# Patient Record
Sex: Female | Born: 1970 | Race: White | Hispanic: No | Smoking: Current every day smoker
Health system: Southern US, Community
[De-identification: ages and names within clinical notes are randomized; demographics above are authoritative.]

## PROBLEM LIST (undated history)

## (undated) DIAGNOSIS — F4321 Adjustment disorder with depressed mood: Secondary | ICD-10-CM

## (undated) DIAGNOSIS — F329 Major depressive disorder, single episode, unspecified: Secondary | ICD-10-CM

## (undated) DIAGNOSIS — I10 Essential (primary) hypertension: Secondary | ICD-10-CM

## (undated) DIAGNOSIS — F419 Anxiety disorder, unspecified: Secondary | ICD-10-CM

## (undated) DIAGNOSIS — F32A Depression, unspecified: Secondary | ICD-10-CM

## (undated) HISTORY — PX: OTHER SURGICAL HISTORY: SHX169

## (undated) HISTORY — DX: Adjustment disorder with depressed mood: F43.21

---

## 2002-08-13 ENCOUNTER — Emergency Department (HOSPITAL_COMMUNITY): Admission: EM | Admit: 2002-08-13 | Discharge: 2002-08-13 | Payer: Self-pay | Admitting: *Deleted

## 2002-08-13 ENCOUNTER — Emergency Department (HOSPITAL_COMMUNITY): Admission: EM | Admit: 2002-08-13 | Discharge: 2002-08-13 | Payer: Self-pay | Admitting: Emergency Medicine

## 2003-06-11 ENCOUNTER — Emergency Department (HOSPITAL_COMMUNITY): Admission: EM | Admit: 2003-06-11 | Discharge: 2003-06-11 | Payer: Self-pay | Admitting: Emergency Medicine
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
## 2003-07-28 ENCOUNTER — Encounter (HOSPITAL_COMMUNITY): Admission: RE | Admit: 2003-07-28 | Discharge: 2003-08-27 | Payer: Self-pay | Admitting: Family Medicine

## 2003-11-04 ENCOUNTER — Emergency Department (HOSPITAL_COMMUNITY): Admission: EM | Admit: 2003-11-04 | Discharge: 2003-11-04 | Payer: Self-pay | Admitting: Emergency Medicine
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
## 2009-05-20 ENCOUNTER — Inpatient Hospital Stay (HOSPITAL_COMMUNITY): Admission: EM | Admit: 2009-05-20 | Discharge: 2009-05-21 | Payer: Self-pay | Admitting: Emergency Medicine

## 2009-05-23 ENCOUNTER — Emergency Department (HOSPITAL_COMMUNITY): Admission: EM | Admit: 2009-05-23 | Discharge: 2009-05-23 | Payer: Self-pay | Admitting: Emergency Medicine
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
## 2010-05-02 LAB — DIFFERENTIAL
Basophils Absolute: 0 10*3/uL (ref 0.0–0.1)
Basophils Absolute: 0 10*3/uL (ref 0.0–0.1)
Basophils Relative: 0 % (ref 0–1)
Lymphocytes Relative: 17 % (ref 12–46)
Lymphocytes Relative: 24 % (ref 12–46)
Lymphs Abs: 1.4 10*3/uL (ref 0.7–4.0)
Monocytes Absolute: 0.9 10*3/uL (ref 0.1–1.0)
Monocytes Relative: 11 % (ref 3–12)
Neutro Abs: 5.7 10*3/uL (ref 1.7–7.7)

## 2010-05-02 LAB — BASIC METABOLIC PANEL
CO2: 26 mEq/L (ref 19–32)
GFR calc Af Amer: >60 mL/min (ref >60)
Potassium: 3.3 mEq/L — ABNORMAL LOW (ref 3.5–5.1)
Sodium: 127 mEq/L — ABNORMAL LOW (ref 135–145)

## 2010-05-02 LAB — COMPREHENSIVE METABOLIC PANEL
AST: 90 U/L — ABNORMAL HIGH (ref 0–37)
Albumin: 3.9 g/dL (ref 3.5–5.2)
BUN: 4 mg/dL — ABNORMAL LOW (ref 6–23)
CO2: 26 mEq/L (ref 19–32)
Calcium: 9.3 mg/dL (ref 8.4–10.5)
Creatinine, Ser: 0.56 mg/dL (ref 0.4–1.2)
Potassium: 3.9 mEq/L (ref 3.5–5.1)
Sodium: 134 mEq/L — ABNORMAL LOW (ref 135–145)
Total Protein: 6.8 g/dL (ref 6.0–8.3)

## 2010-05-02 LAB — URINALYSIS, ROUTINE W REFLEX MICROSCOPIC
Bilirubin Urine: NEGATIVE
Glucose, UA: NEGATIVE mg/dL
Glucose, UA: NEGATIVE mg/dL
Hgb urine dipstick: NEGATIVE
Ketones, ur: NEGATIVE mg/dL
Ketones, ur: NEGATIVE mg/dL
Nitrite: NEGATIVE
Protein, ur: NEGATIVE mg/dL
Protein, ur: NEGATIVE mg/dL
Specific Gravity, Urine: <1.005 — ABNORMAL LOW (ref 1.005–1.030)
Urobilinogen, UA: 0.2 mg/dL (ref 0.0–1.0)

## 2010-05-02 LAB — PROTIME-INR: Prothrombin Time: 14.2 seconds (ref 11.6–15.2)

## 2010-05-02 LAB — HEPATIC FUNCTION PANEL
ALT: 80 U/L — ABNORMAL HIGH (ref 0–35)
Bilirubin, Direct: 0.3 mg/dL (ref 0.0–0.3)
Indirect Bilirubin: 0.7 mg/dL (ref 0.3–0.9)
Total Bilirubin: 1 mg/dL (ref 0.3–1.2)
Total Protein: 6.9 g/dL (ref 6.0–8.3)

## 2010-05-02 LAB — CBC
HCT: 30.3 % — ABNORMAL LOW (ref 36.0–46.0)
Hemoglobin: 11.3 g/dL — ABNORMAL LOW (ref 12.0–15.0)
MCHC: 37.3 g/dL — ABNORMAL HIGH (ref 30.0–36.0)
MCV: 103.3 fL — ABNORMAL HIGH (ref 78.0–100.0)
RBC: 3.09 MIL/uL — ABNORMAL LOW (ref 3.87–5.11)
RDW: 13.9 % (ref 11.5–15.5)
WBC: 9 10*3/uL (ref 4.0–10.5)

## 2010-05-02 LAB — PREGNANCY, URINE: Preg Test, Ur: NEGATIVE

## 2010-05-02 LAB — LIPASE, BLOOD
Lipase: 32 U/L (ref 11–59)
Lipase: 33 U/L (ref 11–59)

## 2010-05-02 LAB — ETHANOL: Alcohol, Ethyl (B): 315 mg/dL — ABNORMAL HIGH (ref 0–10)

## 2010-05-02 LAB — TYPE AND SCREEN: ABO/RH(D): O POS

## 2011-03-27 ENCOUNTER — Emergency Department (HOSPITAL_COMMUNITY): Payer: Medicaid Other

## 2011-03-27 ENCOUNTER — Inpatient Hospital Stay (HOSPITAL_COMMUNITY)
Admission: EM | Admit: 2011-03-27 | Discharge: 2011-03-31 | DRG: 200 | Disposition: A | Discharge status: Home / Self Care | Payer: Medicaid Other | Attending: Pulmonary Disease | Admitting: Pulmonary Disease

## 2011-03-27 ENCOUNTER — Other Ambulatory Visit: Payer: Self-pay

## 2011-03-27 ENCOUNTER — Encounter (HOSPITAL_COMMUNITY): Payer: Self-pay

## 2011-03-27 DIAGNOSIS — W19XXXA Unspecified fall, initial encounter: Secondary | ICD-10-CM | POA: Diagnosis present

## 2011-03-27 DIAGNOSIS — Z79899 Other long term (current) drug therapy: Secondary | ICD-10-CM

## 2011-03-27 DIAGNOSIS — I10 Essential (primary) hypertension: Secondary | ICD-10-CM | POA: Diagnosis present

## 2011-03-27 DIAGNOSIS — Z7982 Long term (current) use of aspirin: Secondary | ICD-10-CM

## 2011-03-27 DIAGNOSIS — Z23 Encounter for immunization: Secondary | ICD-10-CM

## 2011-03-27 DIAGNOSIS — Z720 Tobacco use: Secondary | ICD-10-CM

## 2011-03-27 DIAGNOSIS — F3289 Other specified depressive episodes: Secondary | ICD-10-CM | POA: Diagnosis present

## 2011-03-27 DIAGNOSIS — D7589 Other specified diseases of blood and blood-forming organs: Secondary | ICD-10-CM | POA: Diagnosis present

## 2011-03-27 DIAGNOSIS — F17200 Nicotine dependence, unspecified, uncomplicated: Secondary | ICD-10-CM | POA: Diagnosis present

## 2011-03-27 DIAGNOSIS — J90 Pleural effusion, not elsewhere classified: Secondary | ICD-10-CM

## 2011-03-27 DIAGNOSIS — E46 Unspecified protein-calorie malnutrition: Secondary | ICD-10-CM | POA: Diagnosis present

## 2011-03-27 DIAGNOSIS — E876 Hypokalemia: Secondary | ICD-10-CM | POA: Diagnosis not present

## 2011-03-27 DIAGNOSIS — S271XXA Traumatic hemothorax, initial encounter: Principal | ICD-10-CM | POA: Diagnosis present

## 2011-03-27 DIAGNOSIS — J189 Pneumonia, unspecified organism: Secondary | ICD-10-CM

## 2011-03-27 DIAGNOSIS — F329 Major depressive disorder, single episode, unspecified: Secondary | ICD-10-CM | POA: Diagnosis present

## 2011-03-27 DIAGNOSIS — F32A Depression, unspecified: Secondary | ICD-10-CM

## 2011-03-27 DIAGNOSIS — J13 Pneumonia due to Streptococcus pneumoniae: Secondary | ICD-10-CM

## 2011-03-27 HISTORY — DX: Tobacco use: Z72.0

## 2011-03-27 HISTORY — DX: Depression, unspecified: F32.A

## 2011-03-27 HISTORY — DX: Essential (primary) hypertension: I10

## 2011-03-27 HISTORY — DX: Other specified diseases of blood and blood-forming organs: D75.89

## 2011-03-27 HISTORY — DX: Pleural effusion, not elsewhere classified: J90

## 2011-03-27 HISTORY — DX: Pneumonia, unspecified organism: J18.9

## 2011-03-27 HISTORY — DX: Major depressive disorder, single episode, unspecified: F32.9

## 2011-03-27 LAB — CBC
Hemoglobin: 13.4 g/dL (ref 12.0–15.0)
MCH: 35.3 pg — ABNORMAL HIGH (ref 26.0–34.0)
Platelets: 207 10*3/uL (ref 150–400)
RBC: 3.8 MIL/uL — ABNORMAL LOW (ref 3.87–5.11)
WBC: 9.5 10*3/uL (ref 4.0–10.5)

## 2011-03-27 LAB — POCT I-STAT, CHEM 8
Chloride: 104 mEq/L (ref 96–112)
Creatinine, Ser: 0.5 mg/dL (ref 0.50–1.10)
HCT: 41 % (ref 36.0–46.0)
Hemoglobin: 13.9 g/dL (ref 12.0–15.0)
Potassium: 3.6 mEq/L (ref 3.5–5.1)
Sodium: 139 mEq/L (ref 135–145)

## 2011-03-27 LAB — DIFFERENTIAL
Basophils Relative: 0 % (ref 0–1)
Eosinophils Absolute: 0.1 10*3/uL (ref 0.0–0.7)
Lymphs Abs: 0.5 10*3/uL — ABNORMAL LOW (ref 0.7–4.0)
Neutro Abs: 7.7 10*3/uL (ref 1.7–7.7)
Neutrophils Relative %: 81 % — ABNORMAL HIGH (ref 43–77)

## 2011-03-27 IMAGING — CR DG CHEST 2V
2 series · 2 of 2 positions shown · non-contrast
Comparison: None.

CLINICAL DATA: Pneumonia, chest pain for week, shortness of breath,
long time smoking history

CHEST - 2 VIEW

[w chest pa]
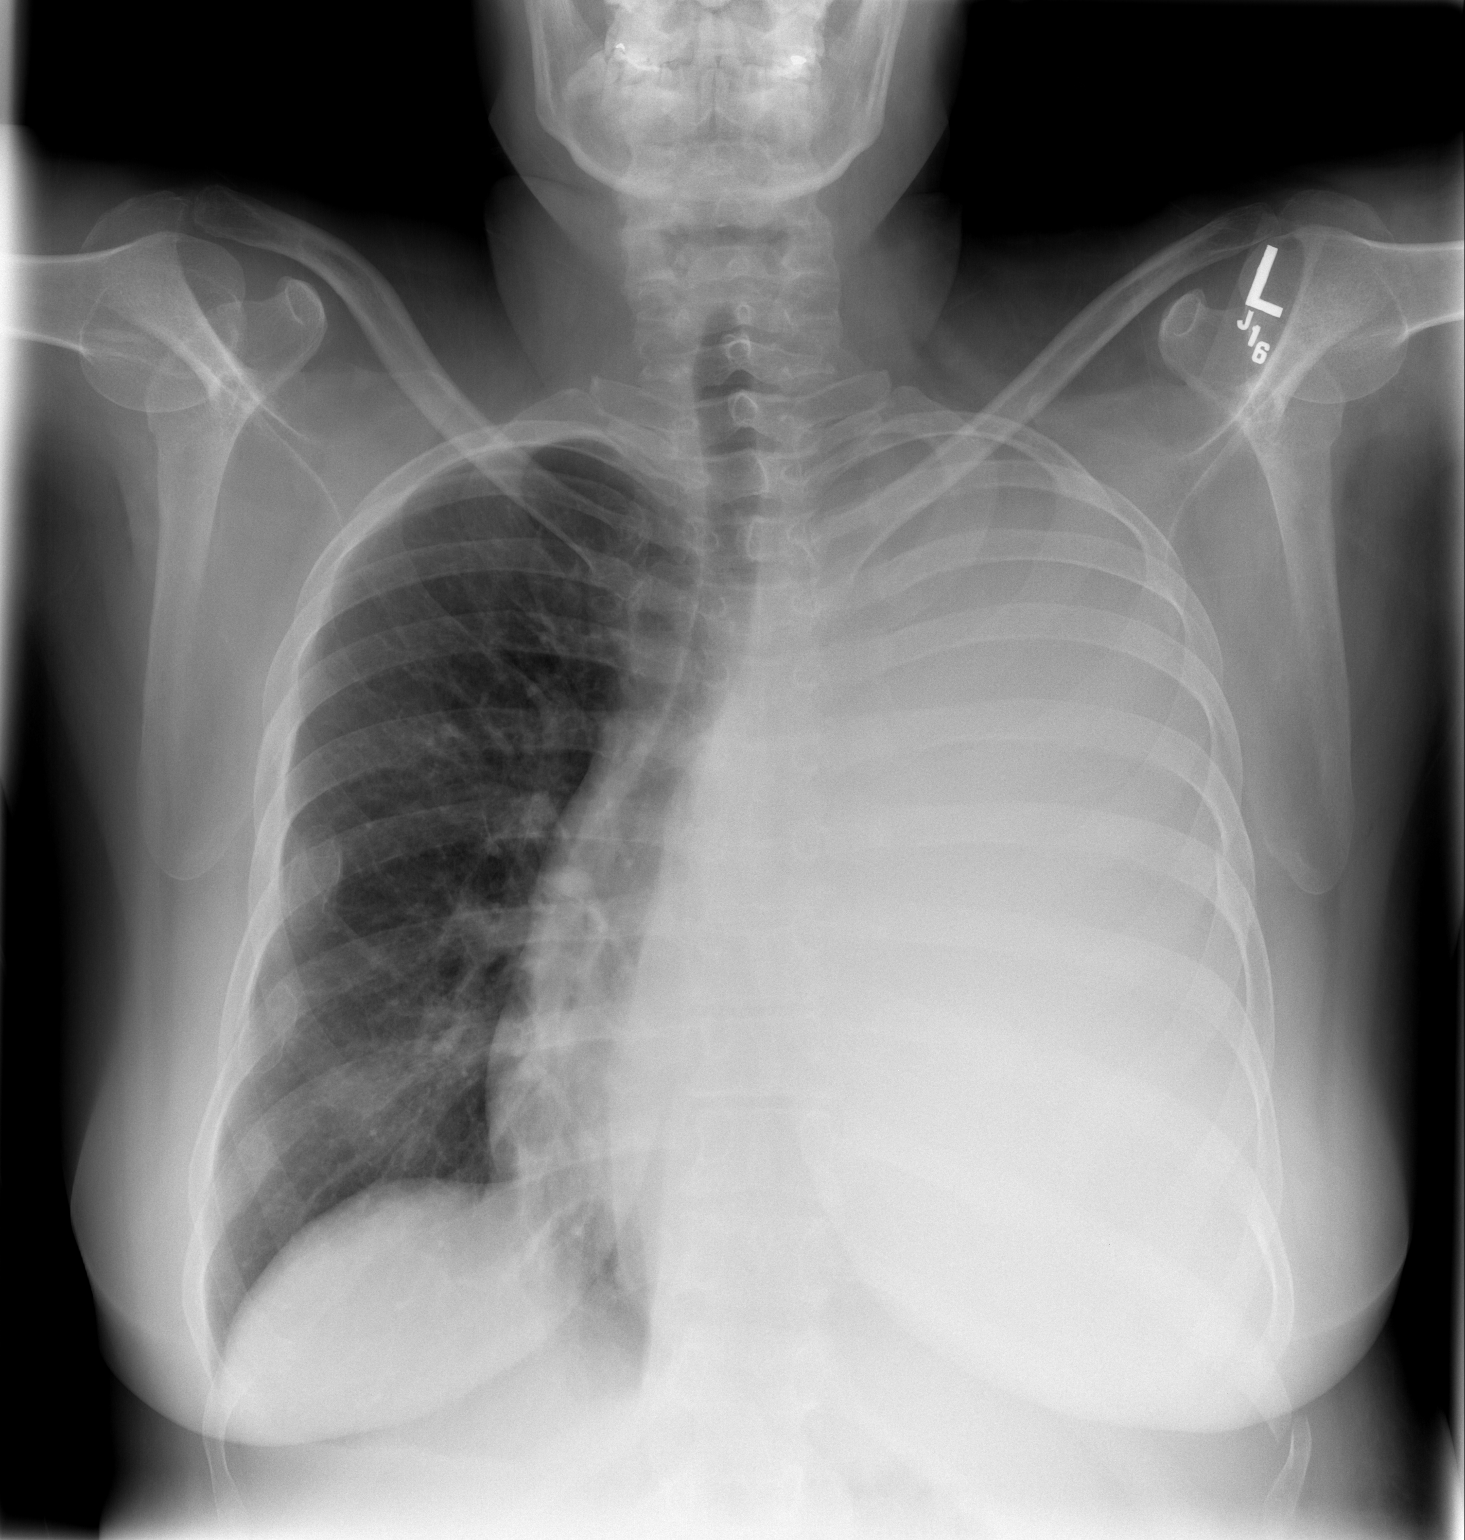

[w chest lat]
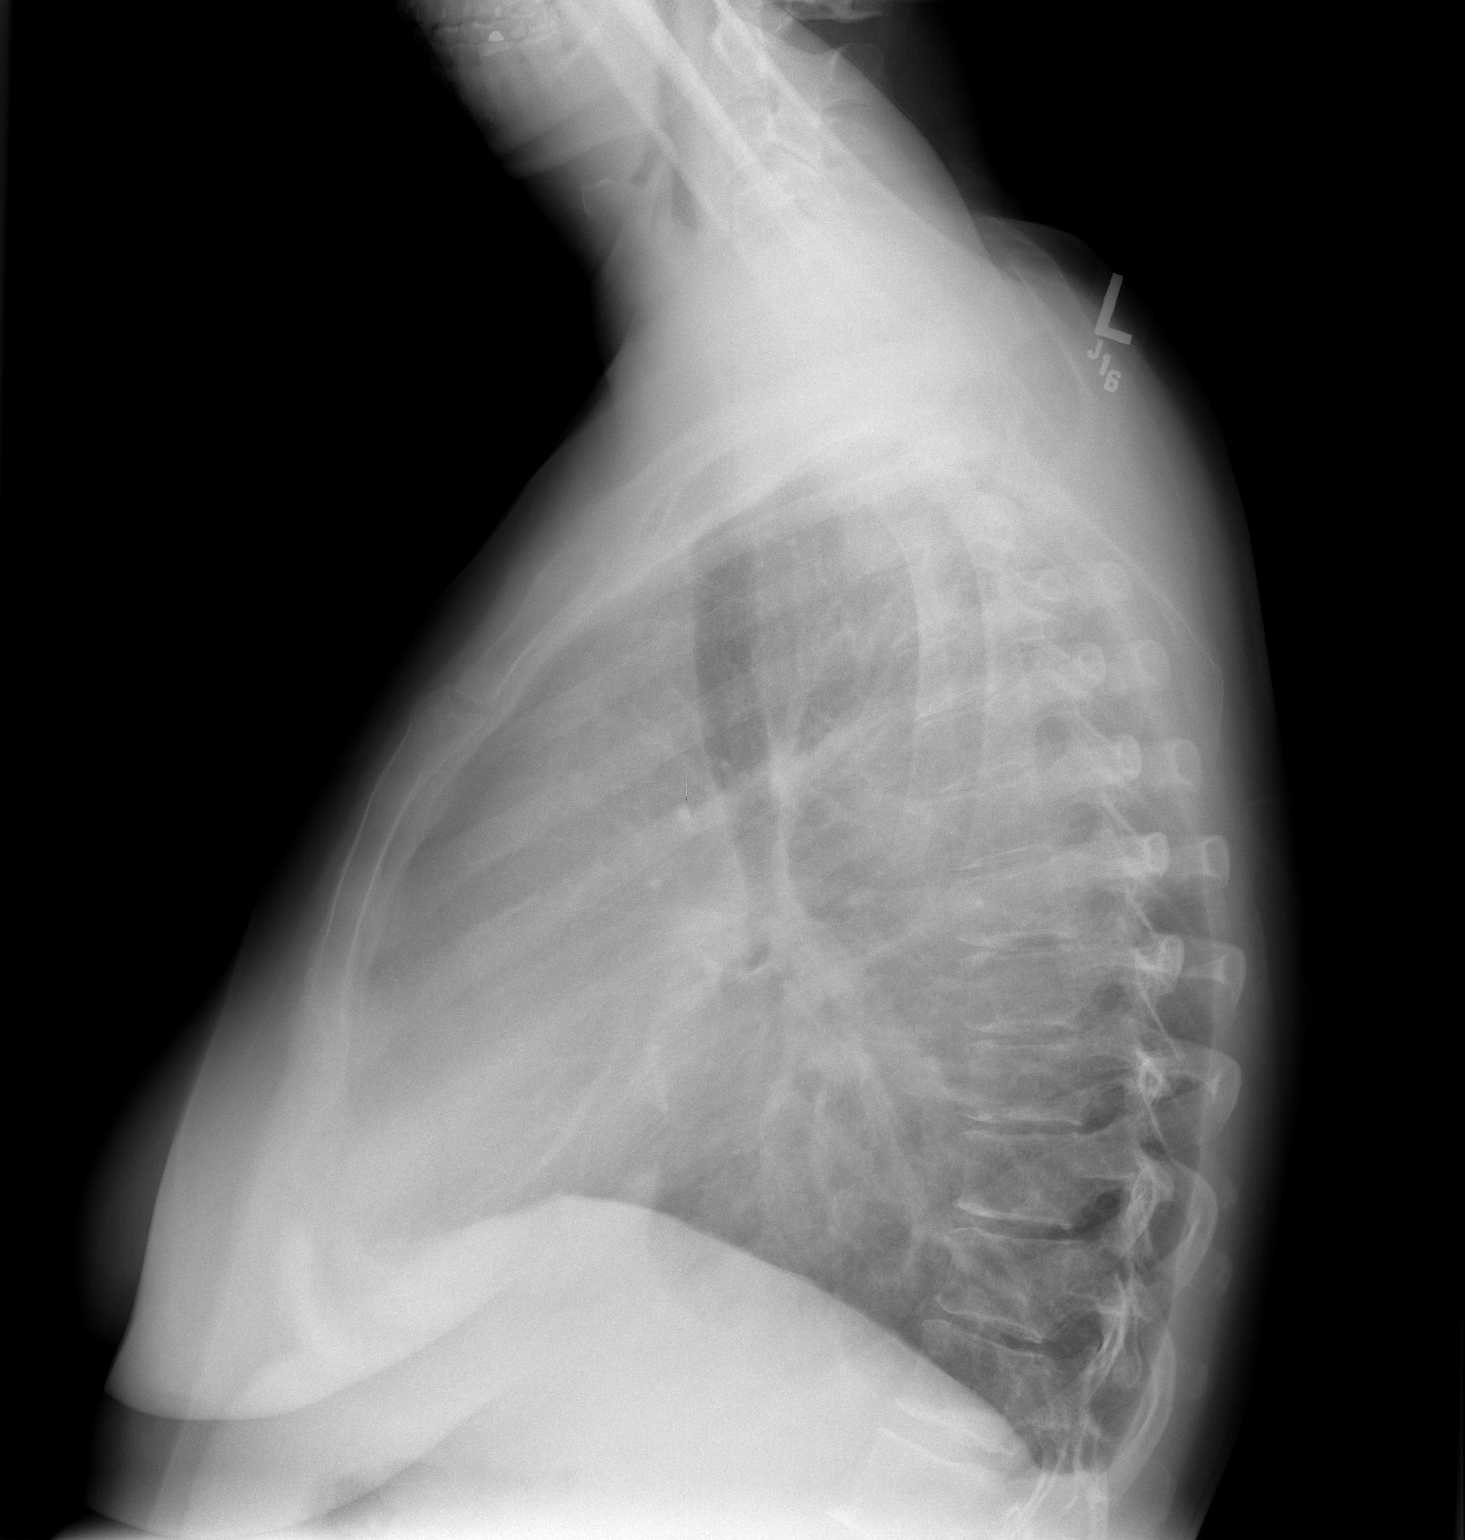

[2 of 2 positions shown; findings below may reference images not displayed]

FINDINGS: There is complete opacification of the left hemithorax
with mediastinal shift to the right.  This mediastinal shift
suggests a large left effusion.  The primary consideration is that
of central endobronchial lesion, and CT of the chest with IV
contrast media is recommended.  The right lung is clear.  The left
mainstem bronchus is not definitely visualized.  Old healed right
rib fractures are noted.
IMPRESSION: Complete opacification of the left hemithorax with no visualization
of the left mainstem bronchus.  These findings are concerning for
primary lung carcinoma and CT of the chest with IV contrast media
is recommended.

## 2011-03-27 IMAGING — CT CT CHEST W/ CM
2 of 4 series · 15 of 36 positions shown, 18 images · IV contrast (omnipaque)
Comparison: Chest radiograph [DATE] and CT abdomen pelvis
[DATE].

CLINICAL DATA: Bronchitis with worsening cough, night sweats and
increasing shortness of breath.

CT CHEST WITH CONTRAST
TECHNIQUE: Multidetector CT imaging of the chest was performed
following the standard protocol during bolus administration of
intravenous contrast.
Contrast: 80mL OMNIPAQUE IOHEXOL 300 MG/ML IV SOLN

[Series 2: routine chest 5.0 st · axial · 0.79mm/px · z∈[-363,-78]mm · 12 of 63 slices shown, 15 images]
[im 3/63  mediastinal]
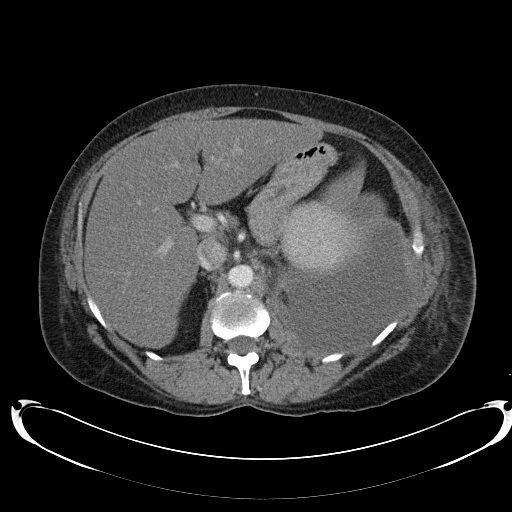
[im 3/63  lung]
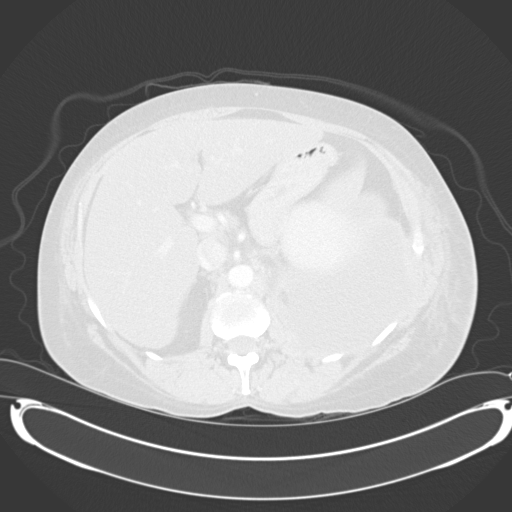
[im 9/63  lung]
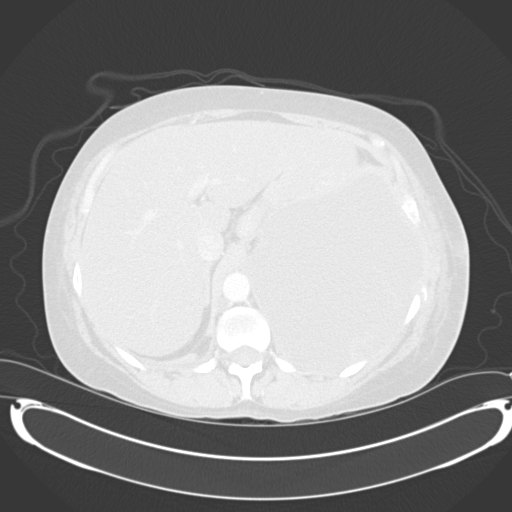
[im 14/63  lung]
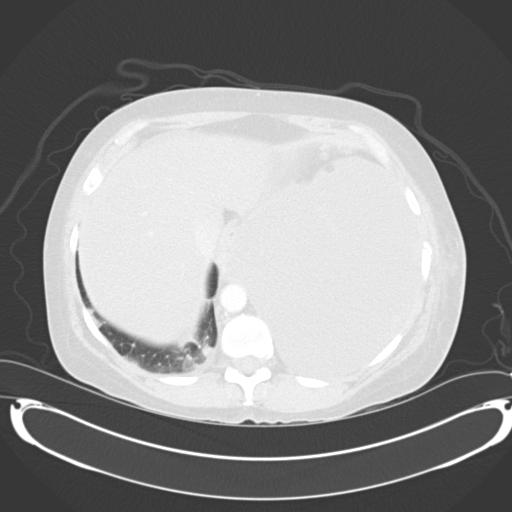
[im 19/63  lung]
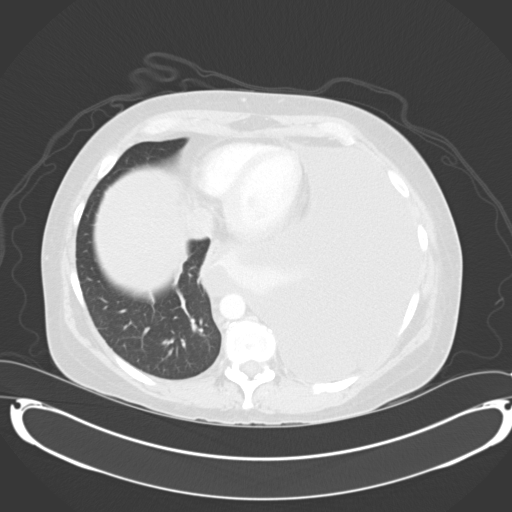
[im 25/63  mediastinal]
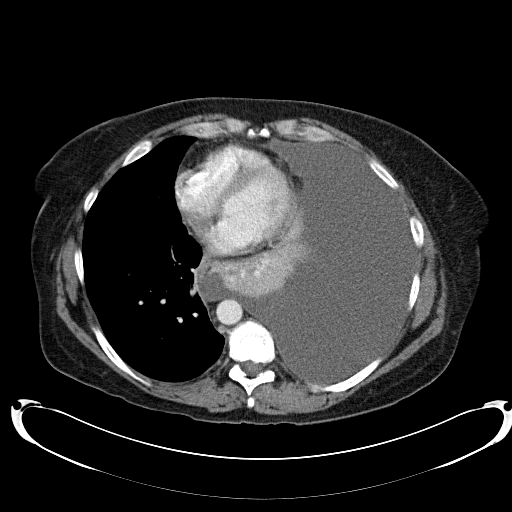
[im 25/63  lung]
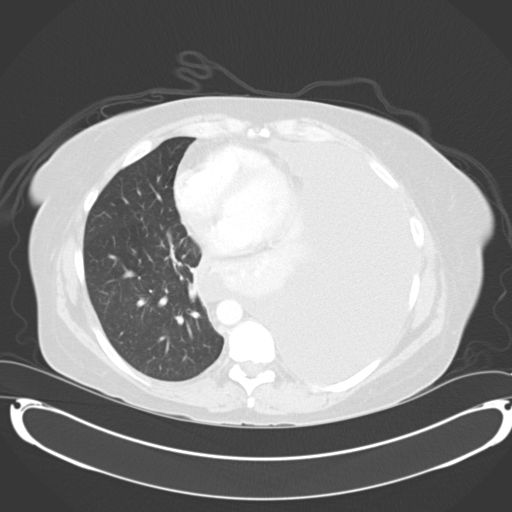
[im 30/63  lung]
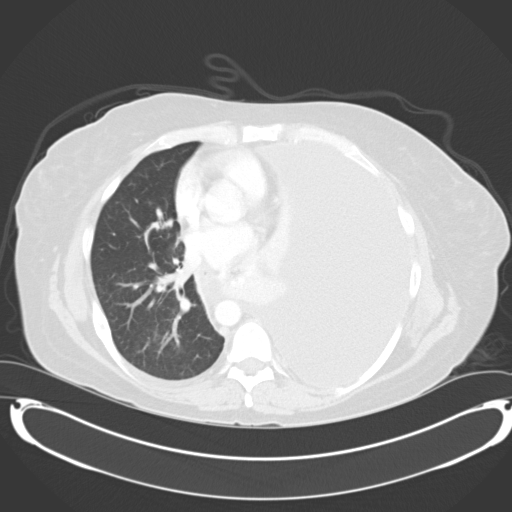
[im 33/63  lung]
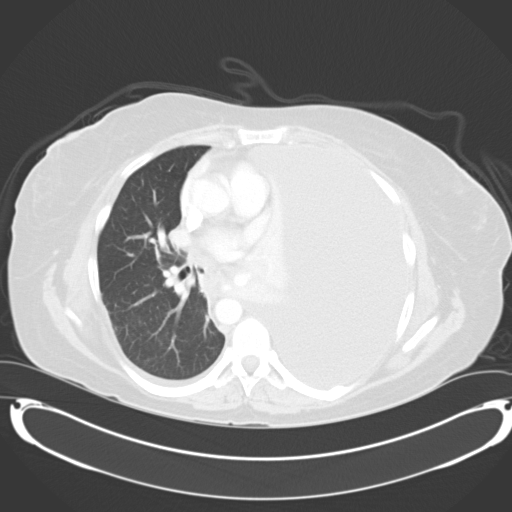
[im 38/63  lung]
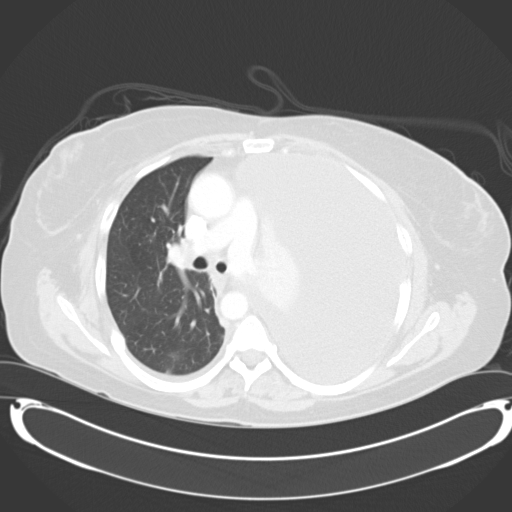
[im 44/63  mediastinal]
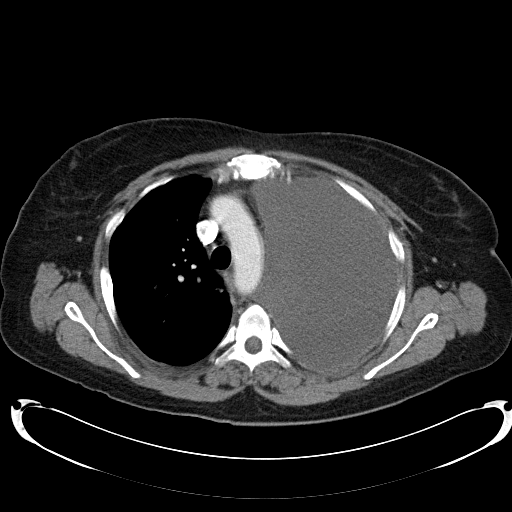
[im 44/63  lung]
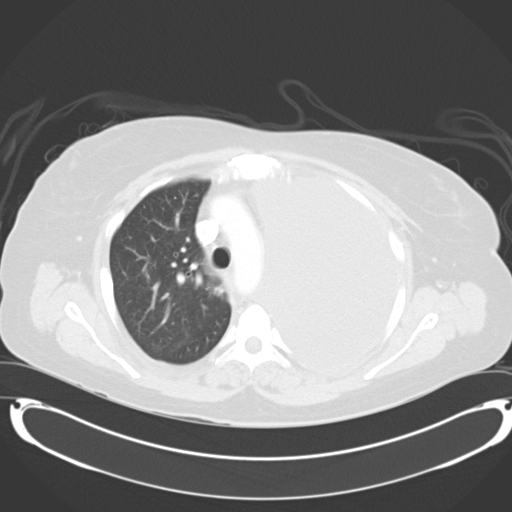
[im 49/63  lung]
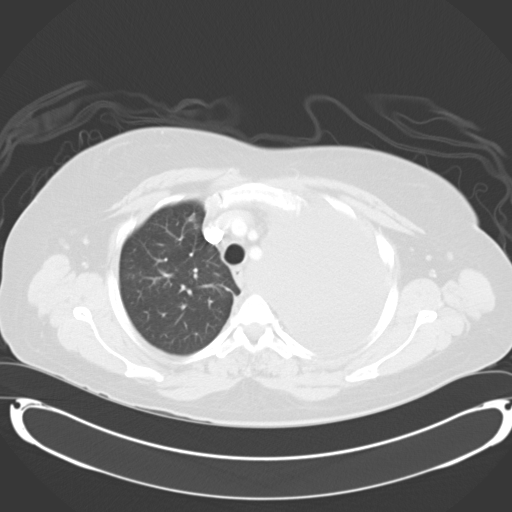
[im 54/63  lung]
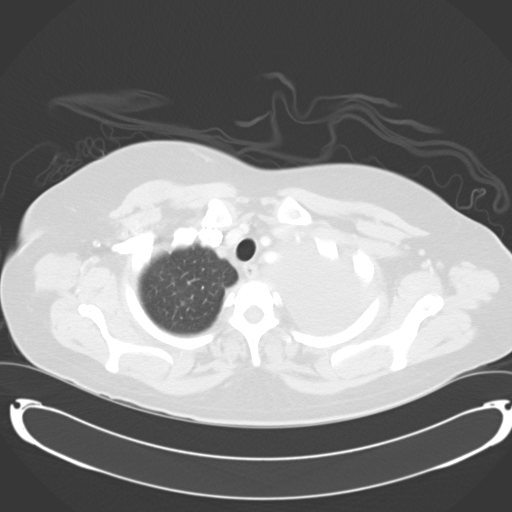
[im 60/63  lung]
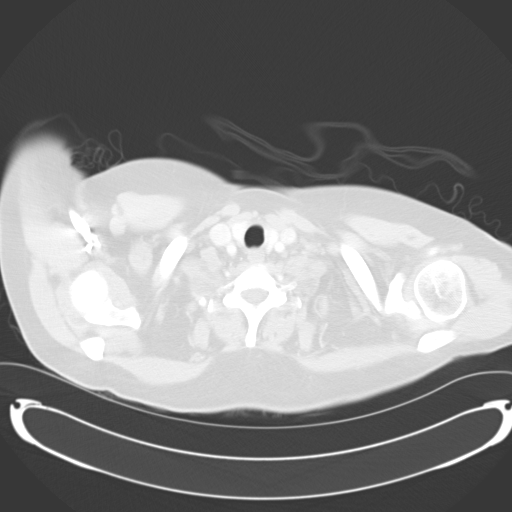

[Series 6: coronals · coronal · 0.72mm/px · 3 of 81 slices shown]
[im 17/81  lung]
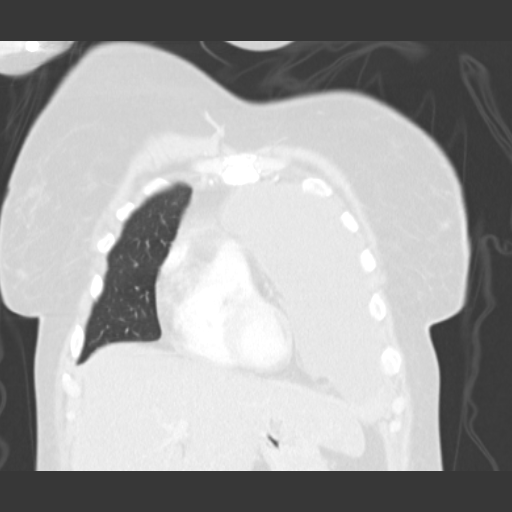
[im 33/81  lung]
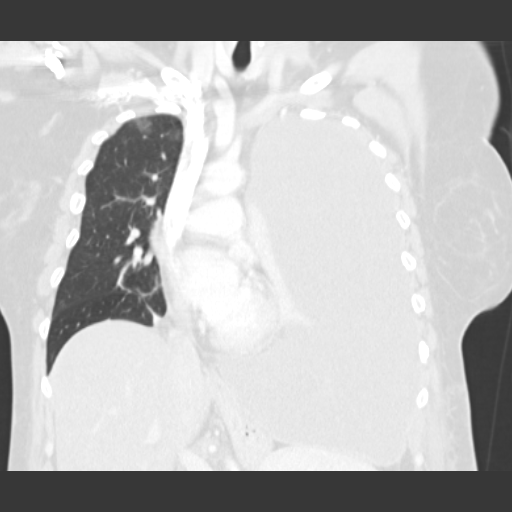
[im 49/81  lung]
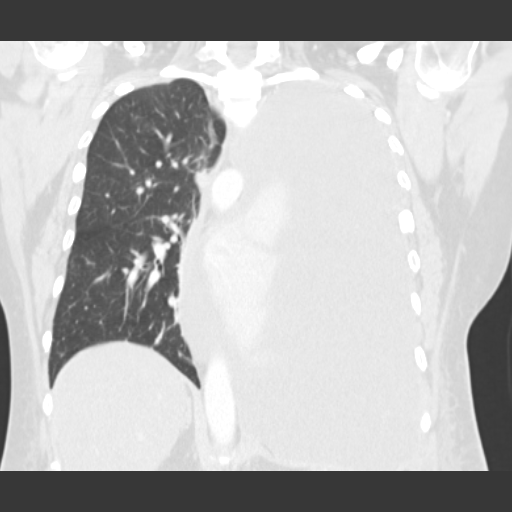

[15 of 36 positions shown; findings below may reference images not displayed]

FINDINGS: No definite mediastinal adenopathy.  Subcarinal lymph
node measures approximately 9 mm.  New right hilar or axillary
adenopathy.

There is a large left pleural effusion, which occupies the entire
left hemithorax and exerts rightward mass effect on the heart and
mediastinum.  Some wispy high density areas are seen inferiorly
within the fluid.  Additionally, there is low attenuation material
within the left upper and left lower lobe bronchi with resultant
obstruction and complete collapse of the left upper and left lower
lobes.

Trace right pleural fluid.  Scattered patchy ground-glass air space
disease in the right lung.  Minimal compressive atelectasis is seen
dependently in the right lower lobe.

Incidental imaging of the upper abdomen shows low attenuation
throughout the visualized portion of the liver. Left hepatic lobe
hemangioma is better visualized on prior studies.  Gastrohepatic
ligament lymph nodes measure up to 11 mm.  No worrisome lytic or
sclerotic lesions.  Old rib fractures are seen bilaterally.  Acute
fractures of the left eighth, ninth and tenth lateral ribs are
noted.
IMPRESSION: 1.  Mildly complex large left pleural effusion occupies the entire
left hemithorax.  Associated low attenuation debris or soft tissue
in the region of the left upper and lower lobe bronchi with
complete collapse of the left upper and left lower lobes. Centrally-
obstructing malignancy cannot be excluded.
2.  Scattered patchy ground-glass in the right lung may be
infectious or inflammatory in etiology.
3.  Tiny right pleural effusion.
4.  Acute left eighth, ninth and tenth lateral rib fractures.
5.  Hepatic steatosis.

## 2011-03-27 MED ORDER — ALBUTEROL SULFATE (5 MG/ML) 0.5% IN NEBU
5.0000 mg | INHALATION_SOLUTION | RESPIRATORY_TRACT | Status: AC
  Administered 2011-03-27: 5 mg via RESPIRATORY_TRACT
  Filled 2011-03-27: qty 1

## 2011-03-27 MED ORDER — MORPHINE SULFATE 4 MG/ML IJ SOLN
4.0000 mg | INTRAMUSCULAR | Status: AC
  Administered 2011-03-27: 4 mg via INTRAVENOUS
  Filled 2011-03-27: qty 1

## 2011-03-27 MED ORDER — SODIUM CHLORIDE 0.9 % IV SOLN
INTRAVENOUS | Status: DC
  Administered 2011-03-27 – 2011-03-28 (×2): via INTRAVENOUS
  Administered 2011-03-28: 20 mL/h via INTRAVENOUS
  Administered 2011-03-30: 02:00:00 via INTRAVENOUS

## 2011-03-27 MED ORDER — IBUPROFEN 600 MG PO TABS
600.0000 mg | ORAL_TABLET | Freq: Four times a day (QID) | ORAL | Status: DC | PRN
  Administered 2011-03-28 – 2011-03-29 (×5): 600 mg via ORAL
  Filled 2011-03-27 (×6): qty 1

## 2011-03-27 MED ORDER — DEXTROSE 5 % IV SOLN
1.0000 g | INTRAVENOUS | Status: DC
  Filled 2011-03-27: qty 10

## 2011-03-27 MED ORDER — SODIUM CHLORIDE 0.9 % IV BOLUS (SEPSIS)
1000.0000 mL | Freq: Once | INTRAVENOUS | Status: AC
  Administered 2011-03-27: 1000 mL via INTRAVENOUS

## 2011-03-27 MED ORDER — LISINOPRIL 20 MG PO TABS
20.0000 mg | ORAL_TABLET | Freq: Every day | ORAL | Status: DC
  Administered 2011-03-28 – 2011-03-31 (×4): 20 mg via ORAL
  Filled 2011-03-27 (×4): qty 1

## 2011-03-27 MED ORDER — HYDROMORPHONE HCL PF 1 MG/ML IJ SOLN
1.0000 mg | Freq: Once | INTRAMUSCULAR | Status: AC
  Administered 2011-03-27: 1 mg via INTRAVENOUS
  Filled 2011-03-27: qty 1

## 2011-03-27 MED ORDER — DEXTROSE 5 % IV SOLN
1.0000 g | Freq: Once | INTRAVENOUS | Status: AC
  Administered 2011-03-27: 1 g via INTRAVENOUS
  Filled 2011-03-27: qty 10

## 2011-03-27 MED ORDER — IOHEXOL 300 MG/ML  SOLN
80.0000 mL | Freq: Once | INTRAMUSCULAR | Status: AC | PRN
  Administered 2011-03-27: 80 mL via INTRAVENOUS

## 2011-03-27 MED ORDER — DEXTROSE 5 % IV SOLN
500.0000 mg | INTRAVENOUS | Status: DC
  Filled 2011-03-27: qty 500

## 2011-03-27 MED ORDER — ESCITALOPRAM OXALATE 10 MG PO TABS
10.0000 mg | ORAL_TABLET | Freq: Every day | ORAL | Status: DC
  Administered 2011-03-28 – 2011-03-31 (×4): 10 mg via ORAL
  Filled 2011-03-27 (×4): qty 1

## 2011-03-27 MED ORDER — DEXTROSE 5 % IV SOLN
500.0000 mg | Freq: Once | INTRAVENOUS | Status: AC
  Administered 2011-03-27: 500 mg via INTRAVENOUS
  Filled 2011-03-27: qty 500

## 2011-03-27 NOTE — H&P (Signed)
Name: Kayla Sandoval MRN: [id number] DOB: [birth year]  LOS: 0  PULMONARY ADMISSION NOTE  History of Present Illness: 41 y/o female with HTN and depression presented to the [facility] ED on 2/13 with three weeks of dyspnea.  She fell approximately 3.5 weeks prior to admission and struck her left rib cage.  3 weeks prior to admission she developed a productive cough which she treated at home with antibiotics she had left over from a prior illness.  The productive cough went a way but she remained dyspnic so she went to urgent care on 2/11 and had a CXR showing a large effusion on the left.  She was told to go to [facility], but had to "build up the courage" for it and did not show up until tonight.  She has not had fever or chills but has noted nighttime sweats.  Has lost 30lbs in the last 6 months, has been dieting some.  No hemoptysis.  Lines / Drains:   Cultures / Sepsis markers: 2/13 blood cx x2 >> 2/13 urine st/leg ag >>  Antibiotics: 2/13 ceftriaxone pneumonia >> 2/13 azithro pneumonia >>  Tests / Events: 2/13 CT chest large left effusion with mediastinal shift, collapse of left lung, cannot exclude underlying mass     Past Medical History  Diagnosis Date  . Hypertension   . Depression    History reviewed. No pertinent past surgical history. Prior to Admission medications   Medication Sig Start Date End Date Taking? Authorizing Provider  aspirin 325 MG tablet Take 325 mg by mouth daily as needed. As needed for pain.   Yes Historical Provider, MD  escitalopram (LEXAPRO) 10 MG tablet Take 10 mg by mouth daily.   Yes Historical Provider, MD  ibuprofen (ADVIL,MOTRIN) 200 MG tablet Take 600 mg by mouth every 6 (six) hours as needed.   Yes Historical Provider, MD  lisinopril (PRINIVIL,ZESTRIL) 20 MG tablet Take 20 mg by mouth daily.   Yes Historical Provider, MD   No Known Allergies History reviewed. No pertinent family history. Social History  reports that she has been smoking.  She does not  have any smokeless tobacco history on file. She reports that she drinks alcohol. She reports that she does not use illicit drugs.  Review Of Systems   Gen: Denies fever, chills, weight change, fatigue, has noted night sweats HEENT: Denies blurred vision, double vision, hearing loss, tinnitus, sinus congestion, rhinorrhea, sore throat, neck stiffness, dysphagia PULM: Notes some shortness of breath, denies cough, sputum production, hemoptysis, wheezing now CV: Denies chest pain, edema, orthopnea, paroxysmal nocturnal dyspnea, palpitations GI: Denies abdominal pain, nausea, vomiting, diarrhea, hematochezia, melena, constipation, change in bowel habits GU: Denies dysuria, hematuria, polyuria, oliguria, urethral discharge Endocrine: Denies hot or cold intolerance, polyuria, polyphagia or appetite change Derm: Denies rash, dry skin, scaling or peeling skin change Heme: Denies easy bruising, bleeding, bleeding gums Neuro: Denies headache, numbness, weakness, slurred speech, loss of memory or consciousness  Vital Signs:   Filed Vitals:   03/27/11 1800 03/27/11 1817 03/27/11 1927 03/27/11 2118  BP: 116/87 116/87 131/97 124/90  Pulse: 90 92 96 101  Temp:      TempSrc:      Resp: 14 15 26 20   Height:      Weight:      SpO2: 95% 96% 96% 94%    Physical Examination: Gen:  no acute distress, speaking in full sentences, no accessory muscle use HEENT: NCAT, PERRL, EOMi, OP clear, OP clear Neck: supple without masses or  lad PULM: Occassional insp wheeze on R, diminished throughout left CV: RRR, no mgr, no JVD AB: BS+, soft, nontender, no hsm Ext: warm, no edema, no clubbing, no cyanosis Derm: no rash or skin breakdown Neuro: A&Ox4, CN II-XII intact, strength 5/5 in all 4 extremities Psyche: anxious  Labs and Imaging:   CBC    Component Value Date/Time   WBC 9.5 03/27/2011 0957   RBC 3.80* 03/27/2011 0957   HGB 13.9 03/27/2011 1128   HCT 41.0 03/27/2011 1128   PLT 207 03/27/2011 0957   MCV  103.7* 03/27/2011 0957   MCH 35.3* 03/27/2011 0957   MCHC 34.0 03/27/2011 0957   RDW 14.5 03/27/2011 0957   LYMPHSABS 0.5* 03/27/2011 0957   MONOABS 1.2* 03/27/2011 0957   EOSABS 0.1 03/27/2011 0957   BASOSABS 0.0 03/27/2011 0957    BMET    Component Value Date/Time   NA 139 03/27/2011 1128   K 3.6 03/27/2011 1128   CL 104 03/27/2011 1128   CO2 26 05/23/2009 1720   GLUCOSE 98 03/27/2011 1128   BUN <3* 03/27/2011 1128   CREATININE 0.50 03/27/2011 1128   CALCIUM 9.3 05/23/2009 1720   GFRNONAA >60 05/23/2009 1720   GFRAA  Value: >60        The eGFR has been calculated using the MDRD equation. This calculation has not been validated in all clinical situations. eGFR's persistently <60 mL/min signify possible Chronic Kidney Disease. 05/23/2009 1720   2/13 CT: see above  Assessment and Plan:  41 y/o female with hypertension and tobacco abuse who presents with a large pleural effusion after a fall 3.5 weeks prior to admission symptoms of bronchitis vs. pneumonia 3 weeks prior to admission.  DDx includes traumatic/hemothorax vs. parapneumonic effusion vs. malignant effusion.  Pleural effusion (03/27/2011)   Assessment: ddx as above   Plan:  -thoracentesis by PCCM in AM -will send usual labs to evaluate exudate vs. Transudate as well as cytology and culture -pt/inr now  Pneumonia (03/27/2011)   Assessment: unclear to me if she had pneumonia prior, given ongoing sweats at night will continue antibiotics started in ED but more to cover a parapneumonic effusion.  Abx should not affect tomorrow's pleural chemistries if this is a complicated parapneumonic effusion.   Plan:  -ceftriaxone/azithro, could stop with 24 hours if no fever or chills -blood cultures and urine/str antigens  Tobacco abuse (03/27/2011)   Assessment: states that she quit 3 weeks ago   Plan:  -tobacco cessation counseling ordered  Hypertension (03/27/2011)   Assessment:    Plan:  -lisinopril  Macrocytosis without anemia  (03/27/2011)   Assessment: unclear etiology, perhaps she drinks more alcohol than she lets on   Plan:  -check TSH, B12, folate  Depression (03/27/2011)   Assessment:    Plan:  -continue celexa    Best practices / Disposition: PCCM service  Feeding/protein malnutrition: reg diet Analgesia: ibuprofen prn Sedation: n/a Thromboprophylaxis: scd HOB >30 degrees Ulcer prophylaxis: n/a Glucose control/hyperglycemia: monitor bmet  Heber Atmore, M.D. Pulmonary [facility] Pager: [phone]  03/27/2011, 9:49 PM

## 2011-03-27 NOTE — ED Notes (Signed)
Patient states she started with "bronchitis" approx 2 weeks ago. Was started on amoxicillin but symptoms worsened. States in the last week more sob. Cough worse. Having night sweats. States actvity makes wheezing worse. Has been drinking plenty of fluids but decreased appetite.

## 2011-03-27 NOTE — ED Notes (Signed)
Called resp therepy. Spoke with Kayla Sandoval. She advise she is at lunch and wants staff to go ahead and start treatment
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
## 2011-03-27 NOTE — ED Provider Notes (Signed)
History     CSN: [id number]  Arrival date & time 03/27/11  0908   First MD Initiated Contact with Patient 03/27/11 0920      Chief Complaint  Patient presents with  . Shortness of Breath    (Consider location/radiation/quality/duration/timing/severity/associated sxs/prior treatment) HPI Comments:  She reports being diagnosed with bronchitis 3 weeks ago at urgent care and took  Amoxicillin that she had left over at home. The patient returned to urgent care 2 days ago with worsening shortness of breath and cough. Her cough was productive of brown sputum.  She has also been having associated left sided chest and back pain and night sweats.   A chest x-ray was performed and she was diagnosed left-sided pneumonia, was given one injection of antibiotics and advised her to go directly to the ED for treatment. She reports not coming to the ED until today because she was afraid.  She denies fever, N/V/D, abdominal pain, headache, hemoptysis. States she has had a 30 lb weight loss over the past 6 months, but she thought it was because she got back on her Lexapro.    Patient is a 41 y.o. female presenting with shortness of breath. The history is provided by the patient.  Shortness of Breath  Associated symptoms include chest pain and shortness of breath. Pertinent negatives include no fever.    Past Medical History  Diagnosis Date  . Hypertension     No past surgical history on file.  No family history on file.  History  Substance Use Topics  . Smoking status: Current Everyday Smoker  . Smokeless tobacco: Not on file  . Alcohol Use: Yes    OB History    Grav Para Term Preterm Abortions TAB SAB Ect Mult Living                  Review of Systems  Constitutional: Positive for appetite change. Negative for fever.  Eyes: Negative for pain and visual disturbance.  Respiratory: Positive for shortness of breath.   Cardiovascular: Positive for chest pain.  All other systems reviewed and  are negative.    Allergies  Review of patient's allergies indicates no known allergies.  Home Medications   Current Outpatient Rx  Name Route Sig Dispense Refill  . ASPIRIN 325 MG PO TABS Oral Take 325 mg by mouth daily as needed. As needed for pain.    Marland Kitchen ESCITALOPRAM OXALATE 10 MG PO TABS Oral Take 10 mg by mouth daily.    . IBUPROFEN 200 MG PO TABS Oral Take 600 mg by mouth every 6 (six) hours as needed.    Marland Kitchen LISINOPRIL 20 MG PO TABS Oral Take 20 mg by mouth daily.      BP 158/109  Pulse 113  Temp(Src) 98.3 F (36.8 C) (Oral)  Resp 18  Ht 5\' 3"  (1.6 m)  Wt 135 lb (61.236 kg)  BMI 23.91 kg/m2  SpO2 93%  Physical Exam  Nursing note and vitals reviewed. Constitutional: She is oriented to person, place, and time. She appears well-developed and well-nourished.  HENT:  Head: Normocephalic and atraumatic.  Neck: Normal range of motion. Neck supple.  Cardiovascular: Normal rate, regular rhythm and normal heart sounds.   Pulmonary/Chest: No respiratory distress. She has decreased breath sounds in the left middle field and the left lower field. She has no wheezes. She exhibits no tenderness.       Greatly diminished breath sounds throughout left lung.    Abdominal: Soft. Bowel sounds are  normal. She exhibits no distension. There is no tenderness. There is no rebound and no guarding.  Neurological: She is alert and oriented to person, place, and time.  Psychiatric: She has a normal mood and affect. Her behavior is normal. Judgment and thought content normal.    ED Course  Procedures (including critical care time)  Labs Reviewed  CBC - Abnormal; Notable for the following:    RBC 3.80 (*)    MCV 103.7 (*)    MCH 35.3 (*)    All other components within normal limits  DIFFERENTIAL - Abnormal; Notable for the following:    Neutrophils Relative 81 (*)    Lymphocytes Relative 6 (*)    Lymphs Abs 0.5 (*)    Monocytes Absolute 1.2 (*)    All other components within normal limits    POCT I-STAT, CHEM 8 - Abnormal; Notable for the following:    BUN <3 (*)    All other components within normal limits   Dg Chest 2 View  03/27/2011  *RADIOLOGY REPORT*  Clinical Data: Pneumonia, chest pain for week, shortness of breath, long time smoking history  CHEST - 2 VIEW  Comparison: None.  Findings: There is complete opacification of the left hemithorax with mediastinal shift to the right.  This mediastinal shift suggests a large left effusion.  The primary consideration is that of central endobronchial lesion, and CT of the chest with IV contrast media is recommended.  The right lung is clear.  The left mainstem bronchus is not definitely visualized.  Old healed right rib fractures are noted.  IMPRESSION: Complete opacification of the left hemithorax with no visualization of the left mainstem bronchus.  These findings are concerning for primary lung carcinoma and CT of the chest with IV contrast media is recommended.  Original Report Authenticated By: Juline Patch, M.D.    12:45 PM I have called radiology as patient has not been taken for CXR.  Patient now to go for xray.    2:22 PM Patient reports mild improvement following 2nd neb treatment.  Patient's O2 saturation on room air is now 85-89%.   I have discussed the patient with Dr Anitra Lauth who has also seen the patient and will call for admission.    Patient is comfortable on 2L by nasal canula.  Continues to have left sided pain, requests pain medication.    1. Pleural effusion, left       MDM  41 year old smoker with 3 weeks of progressive SOB and cough productive of brown sputum.  Dx 2 days ago with left sided pneumonia, comes to ED today with worsening symptoms, found to have left sided complex pleural effusion and hypoxia.  Patient to be admitted to unassigned medicine (pt has no PCP) by Dr Anitra Lauth.  Azithromycin and Rocephin ordered.         Rise Patience, [place] 03/27/11 1556

## 2011-03-27 NOTE — ED Notes (Signed)
States her lungs hurt from the PNA. Requesting something to drink advised NPO status until seen by admitting physician

## 2011-03-27 NOTE — ED Notes (Signed)
Awaiting pt arrival to cdu

## 2011-03-27 NOTE — ED Notes (Signed)
CONTINUES TO AWAIT ADMIT TO HOSPITAL.

## 2011-03-27 NOTE — ED Provider Notes (Signed)
History     CSN: [id number]  Arrival date & time 03/27/11  0908   First MD Initiated Contact with Patient 03/27/11 0920      Chief Complaint  Patient presents with  . Shortness of Breath    (Consider location/radiation/quality/duration/timing/severity/associated sxs/prior treatment) HPI  Past Medical History  Diagnosis Date  . Hypertension     History reviewed. No pertinent past surgical history.  History reviewed. No pertinent family history.  History  Substance Use Topics  . Smoking status: Current Everyday Smoker  . Smokeless tobacco: Not on file  . Alcohol Use: Yes    OB History    Grav Para Term Preterm Abortions TAB SAB Ect Mult Living                  Review of Systems  Allergies  Review of patient's allergies indicates no known allergies.  Home Medications   Current Outpatient Rx  Name Route Sig Dispense Refill  . ASPIRIN 325 MG PO TABS Oral Take 325 mg by mouth daily as needed. As needed for pain.    Marland Kitchen ESCITALOPRAM OXALATE 10 MG PO TABS Oral Take 10 mg by mouth daily.    . IBUPROFEN 200 MG PO TABS Oral Take 600 mg by mouth every 6 (six) hours as needed.    Marland Kitchen LISINOPRIL 20 MG PO TABS Oral Take 20 mg by mouth daily.      BP 131/97  Pulse 96  Temp(Src) 98.3 F (36.8 C) (Oral)  Resp 26  Ht 5\' 3"  (1.6 m)  Wt 135 lb (61.236 kg)  BMI 23.91 kg/m2  SpO2 96%  Physical Exam  ED Course  Procedures (including critical care time)  Labs Reviewed  CBC - Abnormal; Notable for the following:    RBC 3.80 (*)    MCV 103.7 (*)    MCH 35.3 (*)    All other components within normal limits  DIFFERENTIAL - Abnormal; Notable for the following:    Neutrophils Relative 81 (*)    Lymphocytes Relative 6 (*)    Lymphs Abs 0.5 (*)    Monocytes Absolute 1.2 (*)    All other components within normal limits  POCT I-STAT, CHEM 8 - Abnormal; Notable for the following:    BUN <3 (*)    All other components within normal limits   Dg Chest 2 View  03/27/2011   *RADIOLOGY REPORT*  Clinical Data: Pneumonia, chest pain for week, shortness of breath, long time smoking history  CHEST - 2 VIEW  Comparison: None.  Findings: There is complete opacification of the left hemithorax with mediastinal shift to the right.  This mediastinal shift suggests a large left effusion.  The primary consideration is that of central endobronchial lesion, and CT of the chest with IV contrast media is recommended.  The right lung is clear.  The left mainstem bronchus is not definitely visualized.  Old healed right rib fractures are noted.  IMPRESSION: Complete opacification of the left hemithorax with no visualization of the left mainstem bronchus.  These findings are concerning for primary lung carcinoma and CT of the chest with IV contrast media is recommended.  Original Report Authenticated By: Juline Patch, M.D.   Ct Chest W Contrast  03/27/2011  *RADIOLOGY REPORT*  Clinical Data: Bronchitis with worsening cough, night sweats and increasing shortness of breath.  CT CHEST WITH CONTRAST  Technique:  Multidetector CT imaging of the chest was performed following the standard protocol during bolus administration of intravenous contrast.  Contrast: 80mL OMNIPAQUE IOHEXOL 300 MG/ML IV SOLN  Comparison: Chest radiograph 03/27/2011 and CT abdomen pelvis 05/23/2009.  Findings: No definite mediastinal adenopathy.  Subcarinal lymph node measures approximately 9 mm.  New right hilar or axillary adenopathy.  There is a large left pleural effusion, which occupies the entire left hemithorax and exerts rightward mass effect on the heart and mediastinum.  Some wispy high density areas are seen inferiorly within the fluid.  Additionally, there is low attenuation material within the left upper and left lower lobe bronchi with resultant obstruction and complete collapse of the left upper and left lower lobes.  Trace right pleural fluid.  Scattered patchy ground-glass air space disease in the right lung.  Minimal  compressive atelectasis is seen dependently in the right lower lobe.  Incidental imaging of the upper abdomen shows low attenuation throughout the visualized portion of the liver. Left hepatic lobe hemangioma is better visualized on prior studies.  Gastrohepatic ligament lymph nodes measure up to 11 mm.  No worrisome lytic or sclerotic lesions.  Old rib fractures are seen bilaterally.  Acute fractures of the left eighth, ninth and tenth lateral ribs are noted.  IMPRESSION:  1.  Mildly complex large left pleural effusion occupies the entire left hemithorax.  Associated low attenuation debris or soft tissue in the region of the left upper and lower lobe bronchi with complete collapse of the left upper and left lower lobes. Centrally- obstructing malignancy cannot be excluded. 2.  Scattered patchy ground-glass in the right lung may be infectious or inflammatory in etiology. 3.  Tiny right pleural effusion. 4.  Acute left eighth, ninth and tenth lateral rib fractures. 5.  Hepatic steatosis.  Original Report Authenticated By: Reyes Ivan, M.D.     1. Pleural effusion, left       MDM   1930 Pt in CDU awaiting admission. Spoke to Dr. Vassie Loll  with critical care medicine. States that he will be down shortly that he has had some crashing patients on the floor. Patient just ambulated to the bathroom with assistance. We'll give her something for pain.        Jethro Bastos, NP 03/29/11 1721

## 2011-03-27 NOTE — ED Notes (Signed)
Pt was sent by her PMD to the ER with c/o SOB. Pt has coughing x 1 week

## 2011-03-28 ENCOUNTER — Encounter (HOSPITAL_COMMUNITY): Payer: Self-pay | Admitting: *Deleted

## 2011-03-28 ENCOUNTER — Inpatient Hospital Stay (HOSPITAL_COMMUNITY): Payer: Medicaid Other

## 2011-03-28 DIAGNOSIS — S271XXA Traumatic hemothorax, initial encounter: Secondary | ICD-10-CM

## 2011-03-28 DIAGNOSIS — J9600 Acute respiratory failure, unspecified whether with hypoxia or hypercapnia: Secondary | ICD-10-CM

## 2011-03-28 DIAGNOSIS — F17200 Nicotine dependence, unspecified, uncomplicated: Secondary | ICD-10-CM

## 2011-03-28 DIAGNOSIS — W19XXXA Unspecified fall, initial encounter: Secondary | ICD-10-CM

## 2011-03-28 LAB — BASIC METABOLIC PANEL
BUN: 3 mg/dL — ABNORMAL LOW (ref 6–23)
CO2: 26 mEq/L (ref 19–32)
Calcium: 8.6 mg/dL (ref 8.4–10.5)
Chloride: 104 mEq/L (ref 96–112)
Creatinine, Ser: 0.43 mg/dL — ABNORMAL LOW (ref 0.50–1.10)
Glucose, Bld: 120 mg/dL — ABNORMAL HIGH (ref 70–99)

## 2011-03-28 LAB — LEGIONELLA ANTIGEN, URINE: Legionella Antigen, Urine: NEGATIVE

## 2011-03-28 LAB — EXPECTORATED SPUTUM ASSESSMENT W GRAM STAIN, RFLX TO RESP C

## 2011-03-28 LAB — CBC
HCT: 35.7 % — ABNORMAL LOW (ref 36.0–46.0)
MCH: 35.8 pg — ABNORMAL HIGH (ref 26.0–34.0)
MCHC: 34.5 g/dL (ref 30.0–36.0)
MCV: 103.8 fL — ABNORMAL HIGH (ref 78.0–100.0)
Platelets: 181 10*3/uL (ref 150–400)
RDW: 14.9 % (ref 11.5–15.5)

## 2011-03-28 LAB — STREP PNEUMONIAE URINARY ANTIGEN: Strep Pneumo Urinary Antigen: NEGATIVE

## 2011-03-28 LAB — MRSA PCR SCREENING: MRSA by PCR: NEGATIVE

## 2011-03-28 IMAGING — CR DG CHEST 1V PORT
1 series · 1 of 1 positions shown · non-contrast
Comparison: Portable exam [S3] hours compared to [DATE]

CLINICAL DATA: Left chest tube placement

PORTABLE CHEST - 1 VIEW

[view not recorded]
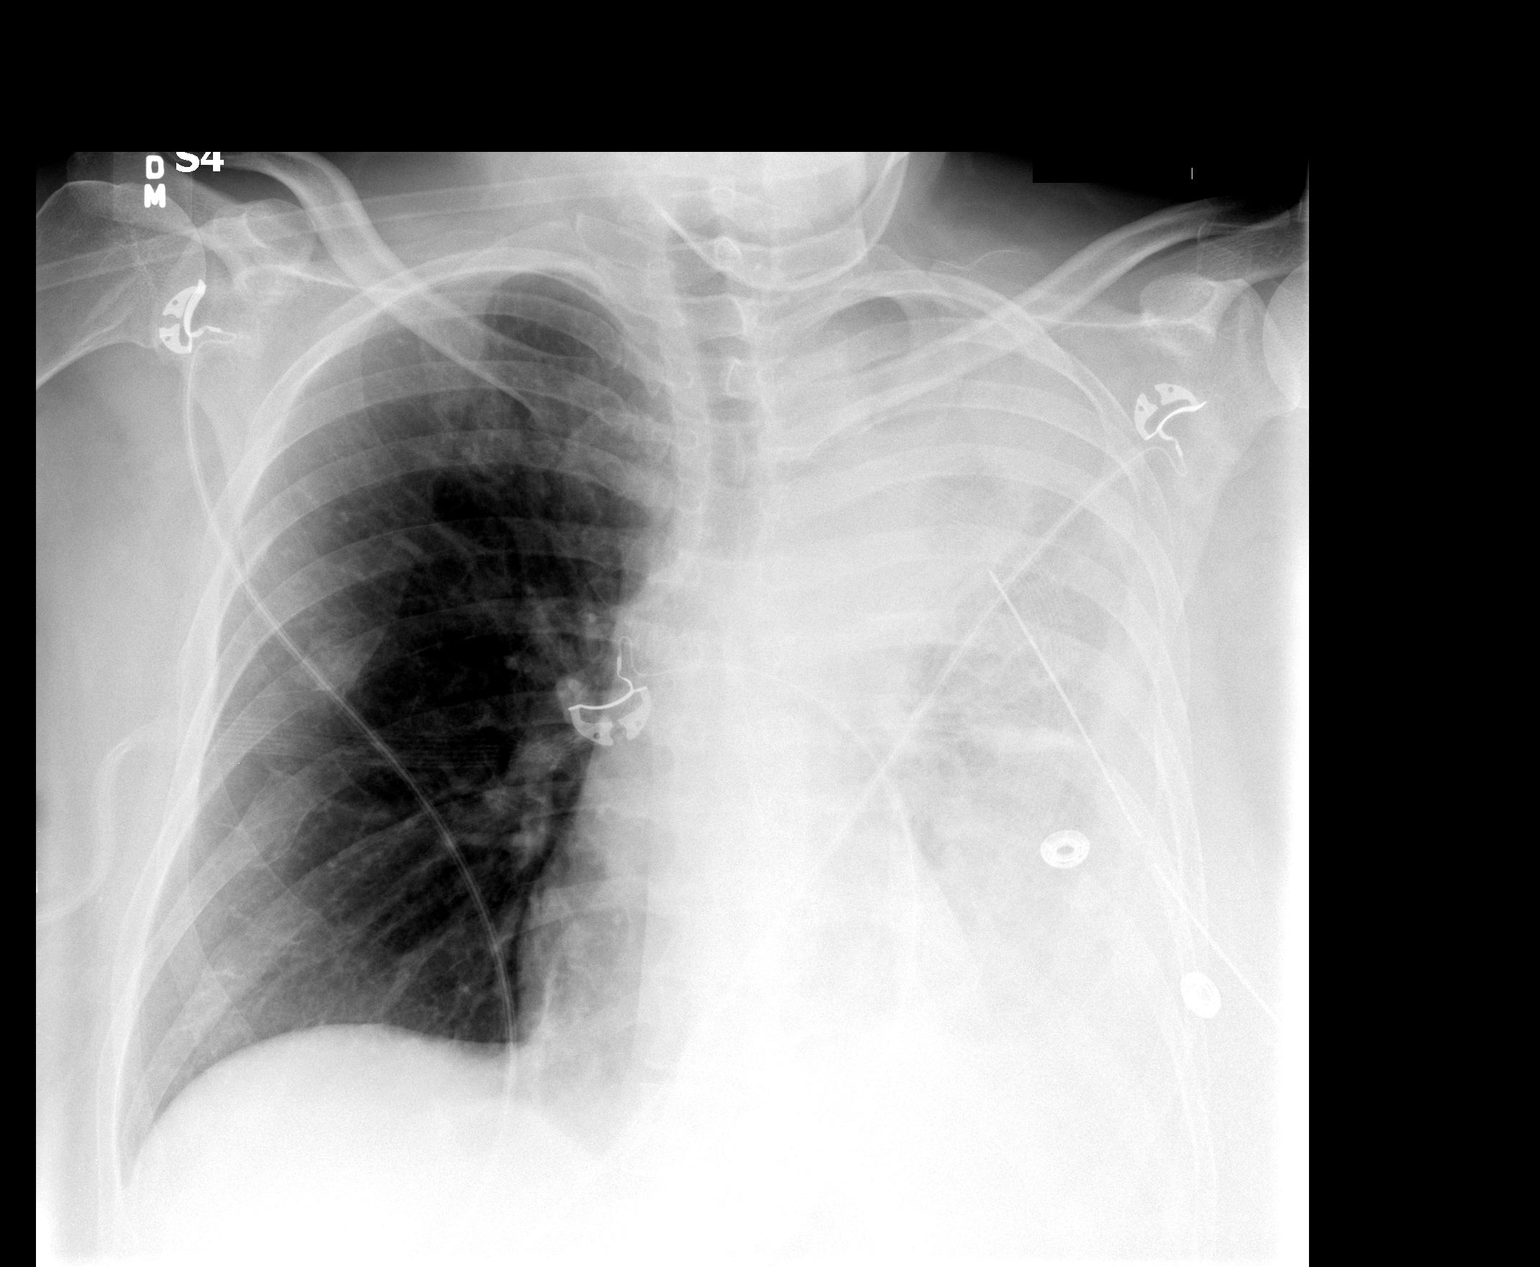

[1 of 1 positions shown; findings below may reference images not displayed]

FINDINGS: Left thoracostomy tube now identified.
Heart size stable.
Decrease in left pleural effusion since previous exam with left
lower lobe now demonstrating partial aeration, with residual
partial left lower lobe atelectasis.
Persistent left upper lobe collapse.
Right lung clear.
No pneumothorax.
Old right rib fractures.
IMPRESSION: Decrease in left pleural effusion post thoracostomy tube placement.

## 2011-03-28 MED ORDER — INFLUENZA VIRUS VACC SPLIT PF IM SUSP
0.5000 mL | INTRAMUSCULAR | Status: AC
  Administered 2011-03-29: 0.5 mL via INTRAMUSCULAR
  Filled 2011-03-28: qty 0.5

## 2011-03-28 MED ORDER — PNEUMOCOCCAL VAC POLYVALENT 25 MCG/0.5ML IJ INJ
0.5000 mL | INJECTION | INTRAMUSCULAR | Status: AC
  Administered 2011-03-29: 0.5 mL via INTRAMUSCULAR
  Filled 2011-03-28: qty 0.5

## 2011-03-28 MED ORDER — MIDAZOLAM HCL 2 MG/2ML IJ SOLN
INTRAMUSCULAR | Status: AC
  Administered 2011-03-28: 2 mg
  Filled 2011-03-28: qty 4

## 2011-03-28 MED ORDER — FENTANYL CITRATE 0.05 MG/ML IJ SOLN
INTRAMUSCULAR | Status: AC
  Administered 2011-03-28: 125 ug
  Filled 2011-03-28: qty 4

## 2011-03-28 MED ORDER — ACETAMINOPHEN 325 MG PO TABS
650.0000 mg | ORAL_TABLET | Freq: Four times a day (QID) | ORAL | Status: DC | PRN
  Administered 2011-03-28 – 2011-03-30 (×3): 650 mg via ORAL
  Filled 2011-03-28 (×3): qty 2

## 2011-03-28 MED ORDER — ALBUTEROL SULFATE (5 MG/ML) 0.5% IN NEBU
5.0000 mg | INHALATION_SOLUTION | RESPIRATORY_TRACT | Status: AC
  Administered 2011-03-28: 5 mg via RESPIRATORY_TRACT
  Filled 2011-03-28: qty 1

## 2011-03-28 MED ORDER — FENTANYL CITRATE 0.05 MG/ML IJ SOLN
25.0000 ug | INTRAMUSCULAR | Status: DC | PRN
Start: 2011-03-28 — End: 2011-03-31
  Administered 2011-03-28 – 2011-03-31 (×20): 50 ug via INTRAVENOUS
  Filled 2011-03-28 (×19): qty 2

## 2011-03-28 NOTE — Procedures (Signed)
Chest Tube Insertion Procedure Note  Indications:  Clinically significant Effusion  Pre-operative Diagnosis: Massive L effusion  Post-operative Diagnosis: Tension hemothorax  Procedure Details  Informed consent was obtained for the procedure, including sedation.  Risks of lung perforation, hemorrhage, arrhythmia, and adverse drug reaction were discussed.   After sterile skin prep, using standard technique, a 28 French tube was placed in the left lateral chest in the anterior axillary line and advanced superiorly and posteriorly entering the pleural space using blunt dissection. Immediately there was a large rush of very bloody fluid through the tract created with approx one liter lost in the bed. . After placement of the tube, another 500 cc of bloody fluid was removed prior to clamping the tube (to avoid re-expansion edema). An additional 1500 cc of bloody fluid were drained in increments of 500 cc over the next couple of hrs. And the chest tube was then placed on suction @ -20 cm H2O. Follow up chest Xray revealed improving aeration with partial re-expansion of the L lung  Estimated Blood Loss:  Minimal         Specimens:  Frankly bloody fluid for hct and cytology only              Complications:  None; patient tolerated the procedure well.         Disposition: Remain in ICU overnight. Possible transfer to SDU or med-surg floor in AM         Condition: stable  Findings: Tension hemothorax - likely related to chest trauma suffered 3 wks prior   Billy Fischer, MD;  PCCM service; Mobile [phone]

## 2011-03-28 NOTE — Progress Notes (Signed)
eLink Physician-Brief Progress Note Patient Name: Kayla Sandoval DOB: 11-04-1970 MRN: [id number]  Date of Service  03/28/2011   HPI/Events of Note   Call from nurse stating that patient is requesting albuterol neb as it helped her to breath better.  Chart review show albuterol neb ordered times 2  eICU Interventions  Plan: One time order for albuterol neb   Intervention Category Intermediate Interventions: Respiratory distress - evaluation and management  Leone Putman 03/28/2011, 3:13 AM

## 2011-03-28 NOTE — Progress Notes (Signed)
Name: Kayla Sandoval MRN: [id number] DOB: Jul 12, 1970    LOS: 1  PCCM FOLLOW UP NOTE  History of Present Illness: 41 y/o female with HTN and depression presented to the [facility] ED on 2/13 with three weeks of dyspnea. She fell approximately 3.5 weeks prior to admission and struck her left rib cage. 3 weeks prior to admission she developed a productive cough which she treated at home with antibiotics she had left over from a prior illness. The productive cough went a way but she remained dyspnic so she went to urgent care on 2/11 and had a CXR showing a large effusion on the left. She was told to go to [facility], but had to "build up the courage" for it and did not show up until tonight. She has not had fever or chills but has noted nighttime sweats. Has lost 30lbs in the last 6 months, has been dieting some. No hemoptysis.   Lines / Drains:  L CT 2/14>>>  Cultures / Sepsis markers:  2/13 blood cx x2 >>  2/13 urine strep>>> 2/13 sputum>>> 2/13 urine leg ag >> neg  2/14 pleural fluid>>>  Antibiotics:  2/13 ceftriaxone pneumonia >>  2/13 azithro pneumonia >>   Tests / Events:  2/13 CT chest large left effusion with mediastinal shift, collapse of left lung, cannot exclude underlying mass   Vital Signs: Temp:  [97.6 F (36.4 C)-98.7 F (37.1 C)] 98.7 F (37.1 C) (02/14 0602) Pulse Rate:  [88-106] 97  (02/14 0602) Resp:  [14-26] 18  (02/14 0602) BP: (116-144)/(78-114) 128/89 mmHg (02/14 0602) SpO2:  [91 %-97 %] 96 % (02/14 0602) Weight:  [155 lb 13.8 oz (70.7 kg)] 155 lb 13.8 oz (70.7 kg) (02/13 2340) I/O last 3 completed shifts: In: 1363.3 [I.V.:1363.3] Out: 900 [Urine:900]   Physical Examination: General: chronically ill appearing female, appears much greater than stated age Neuro: awake, alert, appropriate, MAE CV: s1s2 rrr, NSR, mild tachy PULM: resps even, non labored, speaking in full sentences, diminished L GI: abd soft, +bs Extremities: warm and dry, no edema    Labs and Imaging:    CBC    Component Value Date/Time   WBC 6.7 03/28/2011 0620   RBC 3.44* 03/28/2011 0620   HGB 12.3 03/28/2011 0620   HCT 35.7* 03/28/2011 0620   PLT 181 03/28/2011 0620   MCV 103.8* 03/28/2011 0620   MCH 35.8* 03/28/2011 0620   MCHC 34.5 03/28/2011 0620   RDW 14.9 03/28/2011 0620   LYMPHSABS 0.5* 03/27/2011 0957   MONOABS 1.2* 03/27/2011 0957   EOSABS 0.1 03/27/2011 0957   BASOSABS 0.0 03/27/2011 0957    BMET    Component Value Date/Time   NA 138 03/28/2011 0620   K 3.4* 03/28/2011 0620   CL 104 03/28/2011 0620   CO2 26 03/28/2011 0620   GLUCOSE 120* 03/28/2011 0620   BUN 3* 03/28/2011 0620   CREATININE 0.43* 03/28/2011 0620   CALCIUM 8.6 03/28/2011 0620   GFRNONAA >90 03/28/2011 0620   GFRAA >90 03/28/2011 0620     Lab 03/27/11 2219  INR 1.18    ABG    Component Value Date/Time   TCO2 24 03/27/2011 1128    Dg Chest 2 View  03/27/2011  *RADIOLOGY REPORT*  Clinical Data: Pneumonia, chest pain for week, shortness of breath, long time smoking history  CHEST - 2 VIEW  Comparison: None.  Findings: There is complete opacification of the left hemithorax with mediastinal shift to the right.  This mediastinal shift suggests a  large left effusion.  The primary consideration is that of central endobronchial lesion, and CT of the chest with IV contrast media is recommended.  The right lung is clear.  The left mainstem bronchus is not definitely visualized.  Old healed right rib fractures are noted.  IMPRESSION: Complete opacification of the left hemithorax with no visualization of the left mainstem bronchus.  These findings are concerning for primary lung carcinoma and CT of the chest with IV contrast media is recommended.  Original Report Authenticated By: Juline Patch, M.D.    Assessment and Plan:  Large L Pleural effusion - after fall 3.5 weeks prior to admit, also with bronchitis vs PNA prior to admit.  ? Traumatic v parapneumonic v malignant effusion.  PLAN -  Will tx to ICU and place L  CT Pleural fluid to eval exudate v transudate, cultures, cytology F/u CXR  Cont abx for now to cover ?parapneumonic effusion with recent hx ?PNA - narrow or d/c quickly if fluid transudative and no fevers Urine strep, legionella pending   HTN -  PLAN-  Cont home lisinopril for now  Tobacco abuse -states that she quit 3 weeks ago Plan:  -tobacco cessation counseling ordered   Best practices / Disposition: -->ICU status under PCCM -->full code -->SCD's for DVT Px -->diet -->family updated at bedside   [facility] NP 03/28/2011  11:44 AM Pager: [phone]  *Care during the described time interval was provided by me and/or other providers on the critical care team. I have reviewed this patient's available data, including medical history, events of note, physical examination and test results as part of my evaluation.   Pt seen and examined and database reviewed. I agree with above findings, assessment and plan. I have explained the plan in detail to the pt and her family and have discussed the alternatives to chest tube placement. She agrees to proceed as indicated above  Billy Fischer, MD;  PCCM service; [place] [phone]

## 2011-03-29 ENCOUNTER — Inpatient Hospital Stay (HOSPITAL_COMMUNITY): Payer: Medicaid Other

## 2011-03-29 LAB — CBC
MCHC: 35.5 g/dL (ref 30.0–36.0)
Platelets: 188 10*3/uL (ref 150–400)
RDW: 14.4 % (ref 11.5–15.5)

## 2011-03-29 LAB — LEGIONELLA ANTIGEN, URINE

## 2011-03-29 LAB — BASIC METABOLIC PANEL
BUN: 4 mg/dL — ABNORMAL LOW (ref 6–23)
GFR calc Af Amer: >90 mL/min (ref >90)
GFR calc non Af Amer: >90 mL/min (ref >90)
Potassium: 3.4 mEq/L — ABNORMAL LOW (ref 3.5–5.1)
Sodium: 138 mEq/L (ref 135–145)

## 2011-03-29 LAB — FOLATE RBC: RBC Folate: 971 ng/mL — ABNORMAL HIGH (ref >=366)

## 2011-03-29 IMAGING — CT CT CHEST W/O CM
3 series · 17 of 29 positions shown, 18 images · non-contrast
Comparison: Chest CT [DATE].

CLINICAL DATA: Pneumothorax.  Chest tube removed.

CT CHEST WITHOUT CONTRAST
TECHNIQUE: Multidetector CT imaging of the chest was performed
following the standard protocol without IV contrast.

[Series 2: routine chest · axial · 0.75mm/px · z∈[-249,-154]mm · 3 of 58 slices shown, 4 images]
[im 20/58  mediastinal]
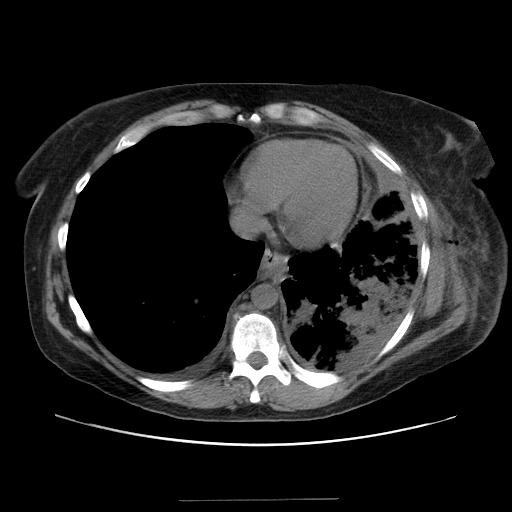
[im 20/58  lung]
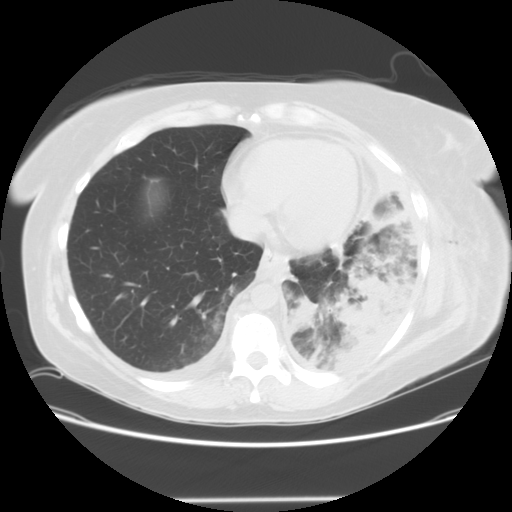
[im 33/58  lung]
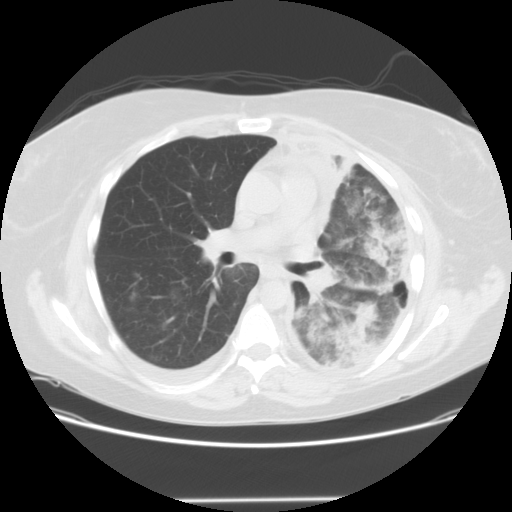
[im 39/58  lung]
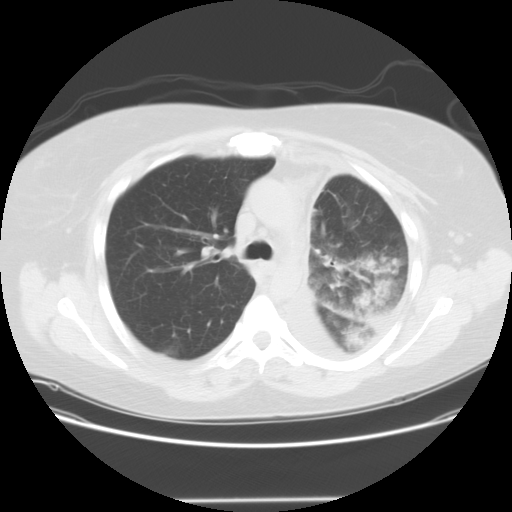

[Series 400: coronals · coronal · 0.75mm/px · 6 of 138 slices shown]
[im 14/138  lung]
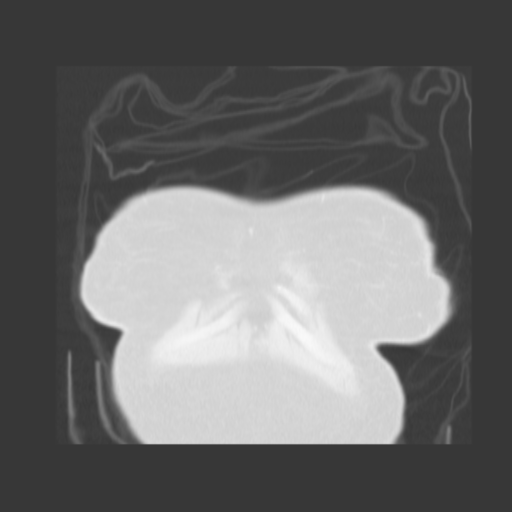
[im 28/138  lung]
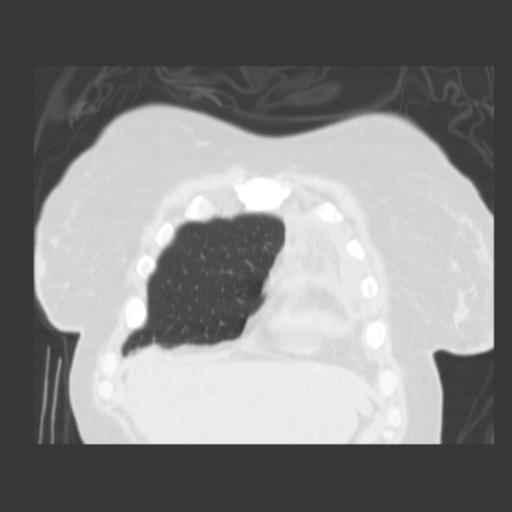
[im 42/138  lung]
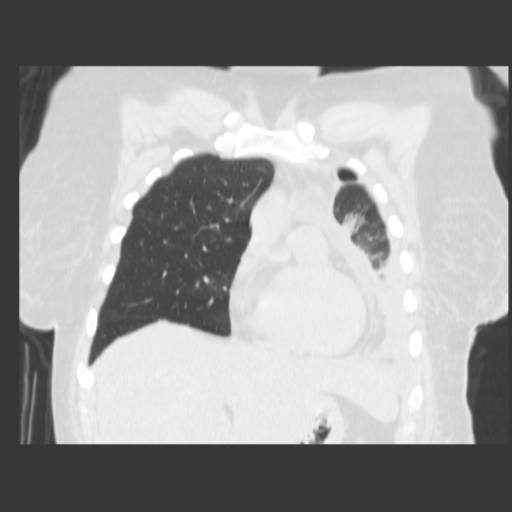
[im 55/138  lung]
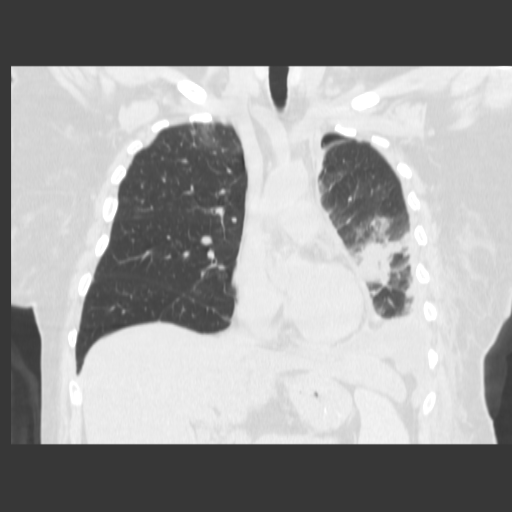
[im 83/138  lung]
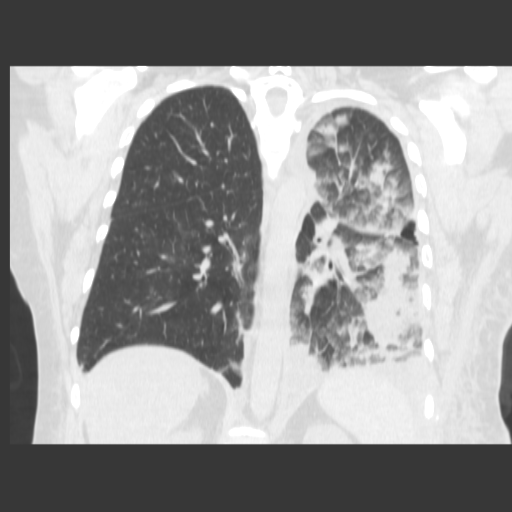
[im 96/138  lung]
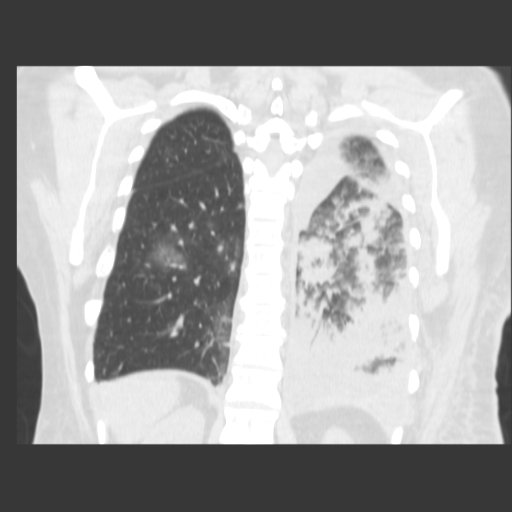

[Series 401: sagittals · sagittal · 0.75mm/px · 8 of 164 slices shown]
[im 14/164  lung]
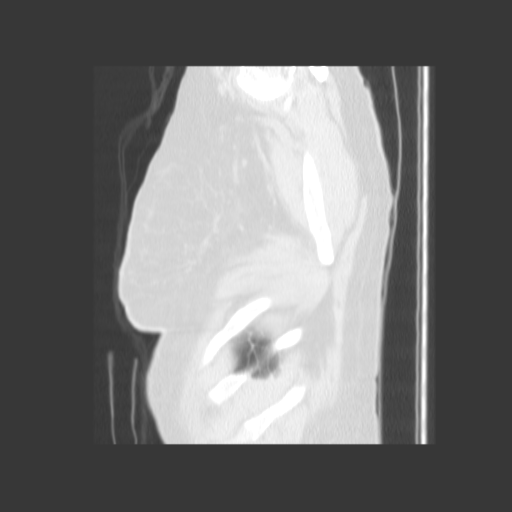
[im 41/164  lung]
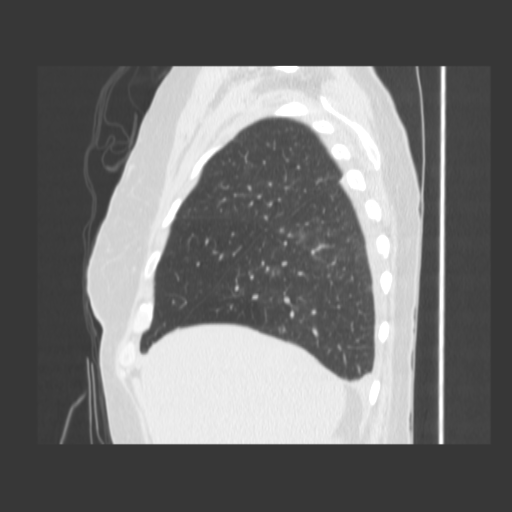
[im 55/164  lung]
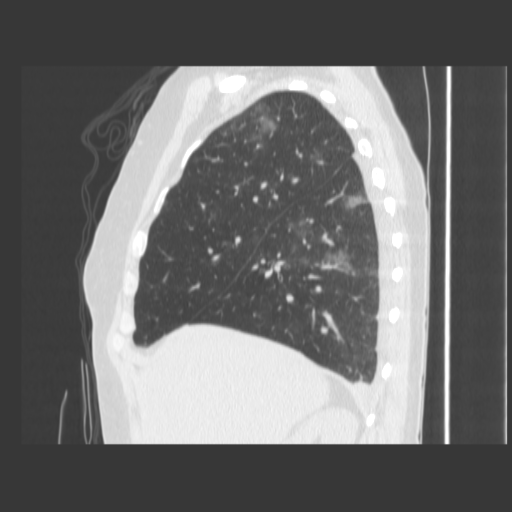
[im 68/164  lung]
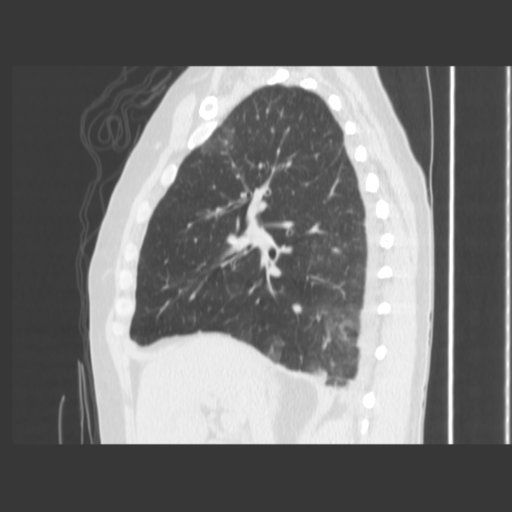
[im 96/164  lung]
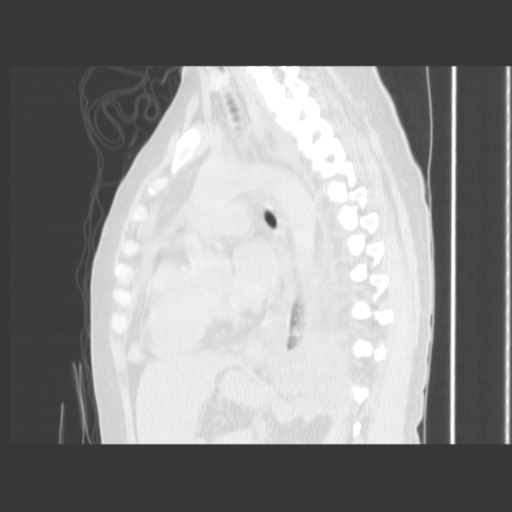
[im 109/164  lung]
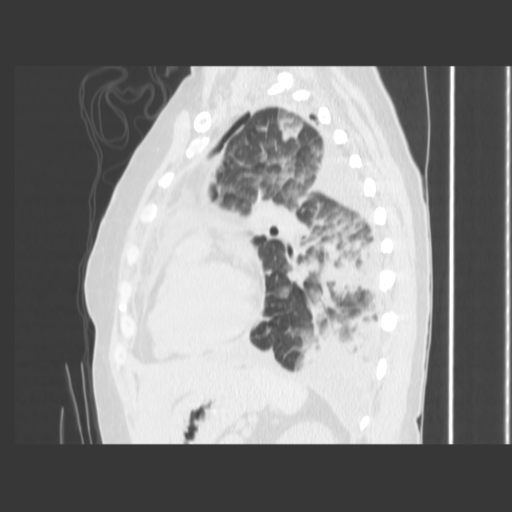
[im 123/164  lung]
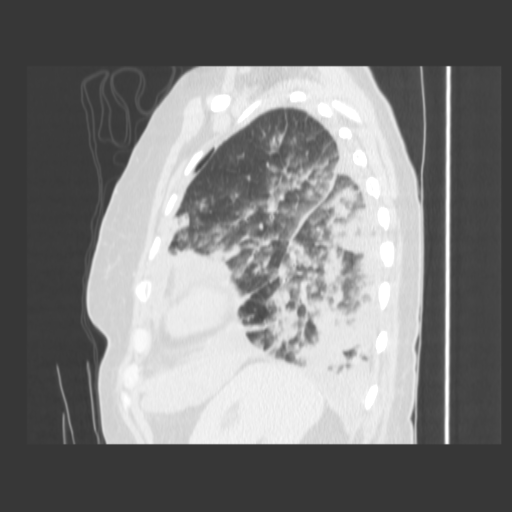
[im 150/164  lung]
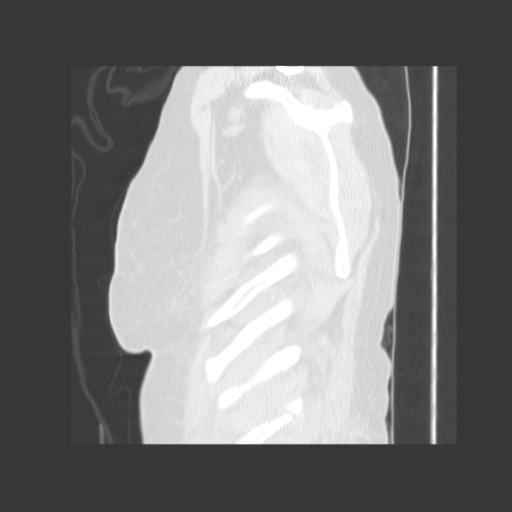

[17 of 29 positions shown; findings below may reference images not displayed]

FINDINGS: Mediastinum: Heart size is normal. There is no significant
pericardial fluid, thickening or pericardial calcification.  Mild
thickening of the distal esophagus. No pathologically enlarged
mediastinal or hilar lymph nodes. Please note that accurate
exclusion of hilar adenopathy is limited on noncontrast CT scans.

Lungs/Pleura: Compared to the prior examination there has been a
significant interval decrease in the size of the complex left sided
pleural fluid collection that has some internal components of
intermediate attenuation (up to 35 HU), suggesting some blood
products. A small apical and anterior pneumothorax is also noted.
The left lung has significantly re-expanded, and now there is
extensive air space consolidation throughout the visualized lung
parenchyma.  Patchy areas of ground-glass attenuation
peribronchovascular air space consolidation are also noted
throughout the right lung, likely to reflect endobronchial spread
of infection or hemorrhage. Notably, no central obstructing lesion
is noted within the airways. Trace dependent right-sided simple
pleural effusion is also noted.

Upper Abdomen: Visualized portions of the upper abdomen are
unremarkable.

Musculoskeletal: Healing subacute fractures of the lateral aspects
of the left eighth, ninth and tenth ribs are noted (eighth and
ninth fractures are nondisplaced, tenth rib fracture is mildly
displaced). Old healed fractures of the lateral aspect of the left
sixth rib and posterolateral right sixth, seventh and eighth ribs
are noted.
IMPRESSION: 1.  Significant decrease in size of complex left pleural fluid
collection, now with only a small residual left hemopneumothorax.
2.  Widespread airspace consolidation, predominately in the left
lung, concerning for multilobar pneumonia.  Some component of
alveolar hemorrhage is also possible.  Some endobronchial spread of
disease is also noted throughout the right lung, to a much lesser
extent.
3.  Trace dependent layering right-sided pleural effusion is simple
in appearance.
4.  Healing subacute fractures of the left sixth (nondisplaced),
seventh (nondisplaced) and eighth (mildly displaced) ribs, as
above.
5.  Multiple old healed bilateral rib fractures again noted.
6.  Mild thickening of the distal third of the esophagus.  This is
nonspecific, but may suggest esophagitis.

## 2011-03-29 IMAGING — CR DG CHEST 1V PORT
1 series · 1 of 1 positions shown · non-contrast
Comparison: Portable chest x-ray of [DATE]

CLINICAL DATA: Follow up of pleural effusion

PORTABLE CHEST - 1 VIEW

[view not recorded]
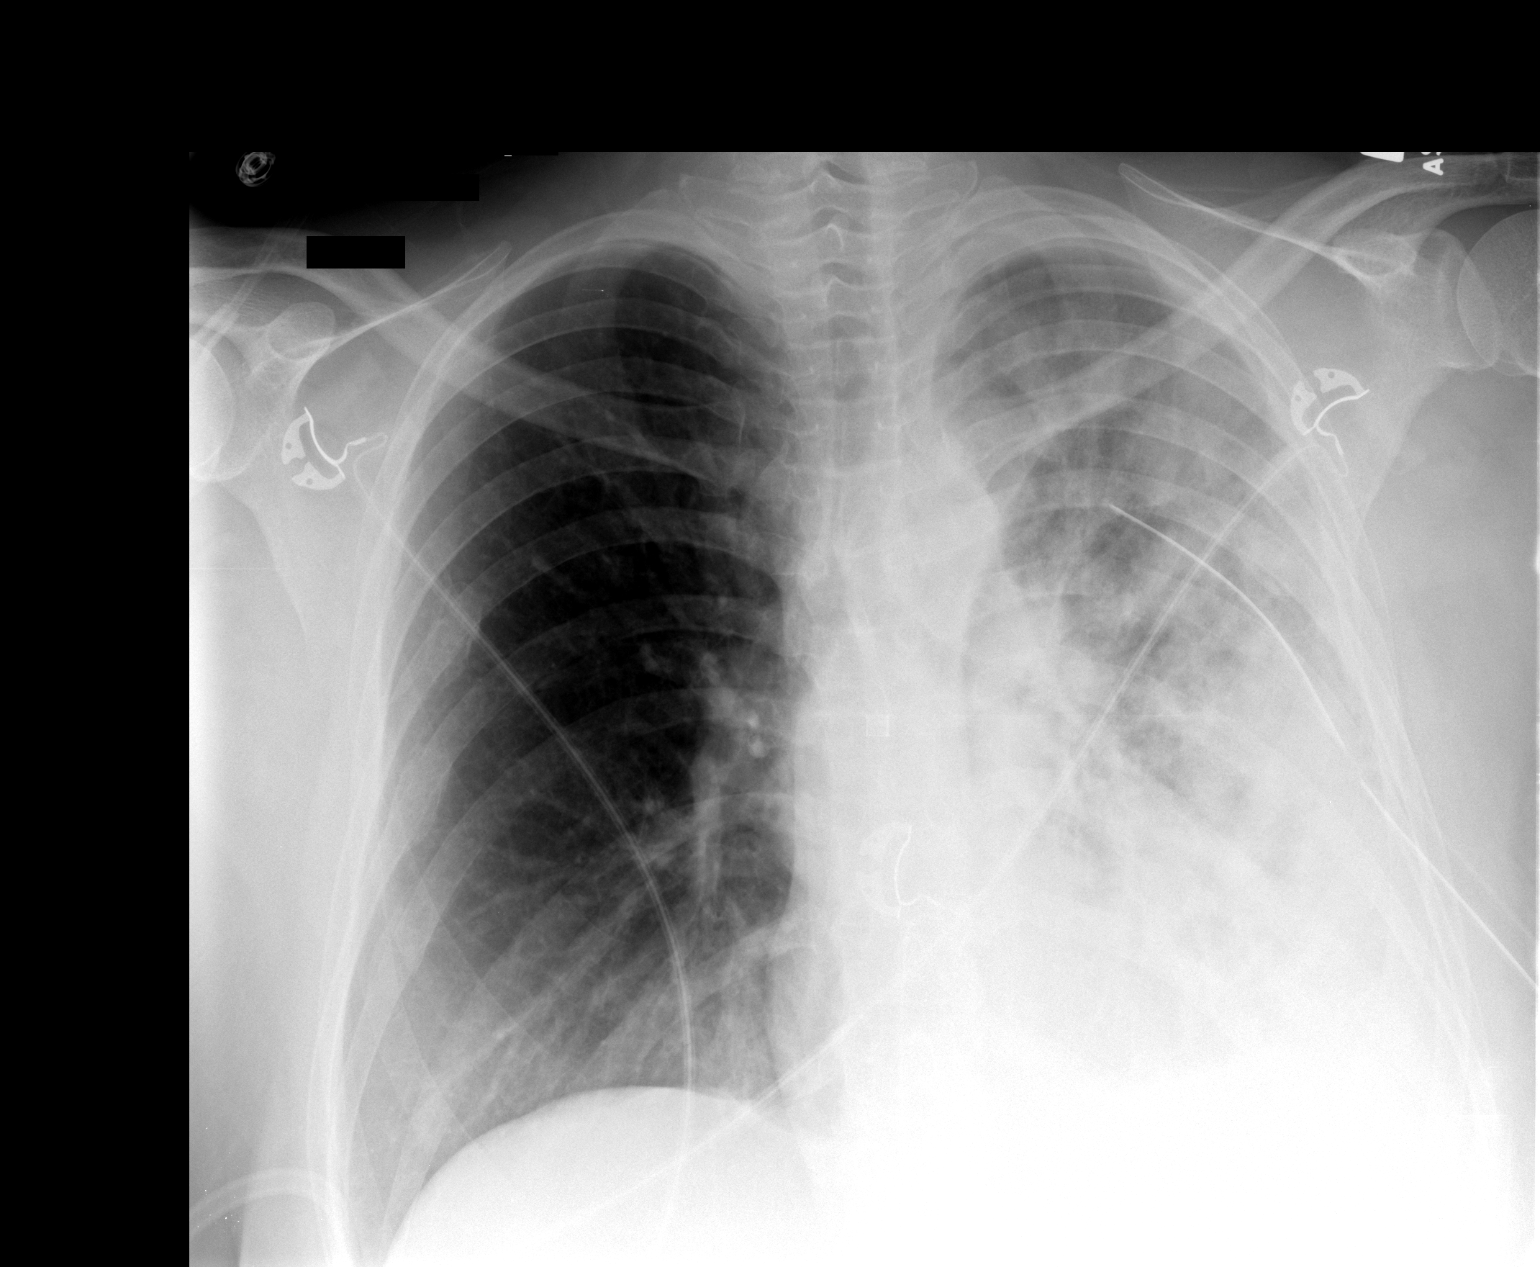

[1 of 1 positions shown; findings below may reference images not displayed]

FINDINGS: There is decreased opacity throughout the left hemithorax
most consistent with decrease in left pleural effusion.  A left
chest tube remains.  There is pleural and parenchymal opacity
remaining throughout the left hemithorax.  The right lung is clear.
Heart size is stable.
IMPRESSION: Improved aeration of the left lung with probable decrease in left
pleural effusion.  Persistent pleural and parenchymal opacities
throughout the left hemithorax.

## 2011-03-29 MED ORDER — ALBUTEROL SULFATE (5 MG/ML) 0.5% IN NEBU: 2.5000 mg | INHALATION_SOLUTION | RESPIRATORY_TRACT | Status: DC | PRN

## 2011-03-29 MED ORDER — KETOROLAC TROMETHAMINE 30 MG/ML IJ SOLN
30.0000 mg | Freq: Four times a day (QID) | INTRAMUSCULAR | Status: DC | PRN
  Administered 2011-03-29 – 2011-03-30 (×3): 30 mg via INTRAVENOUS
  Filled 2011-03-29 (×2): qty 1

## 2011-03-29 MED ORDER — FLUTICASONE-SALMETEROL 250-50 MCG/DOSE IN AEPB
1.0000 | INHALATION_SPRAY | Freq: Two times a day (BID) | RESPIRATORY_TRACT | Status: DC
  Administered 2011-03-29 – 2011-03-31 (×5): 1 via RESPIRATORY_TRACT
  Filled 2011-03-29: qty 14

## 2011-03-29 MED ORDER — POTASSIUM CHLORIDE CRYS ER 20 MEQ PO TBCR
40.0000 meq | EXTENDED_RELEASE_TABLET | Freq: Once | ORAL | Status: AC
  Administered 2011-03-29: 40 meq via ORAL
  Filled 2011-03-29 (×2): qty 2

## 2011-03-29 MED ORDER — KETOROLAC TROMETHAMINE 30 MG/ML IJ SOLN
INTRAMUSCULAR | Status: AC
  Administered 2011-03-29: 30 mg via INTRAVENOUS
  Filled 2011-03-29: qty 1

## 2011-03-29 NOTE — Progress Notes (Signed)
eLink Physician-Brief Progress Note Patient Name: Kayla Sandoval DOB: [birth year] MRN: [id number]  Date of Service  03/29/2011   HPI/Events of Note  Hypokalemia   eICU Interventions  Potassium replaced   Intervention Category Intermediate Interventions: Electrolyte abnormality - evaluation and management  DETERDING,ELIZABETH 03/29/2011, 6:35 AM

## 2011-03-29 NOTE — Progress Notes (Signed)
Report called to [facility] to bed [phone]. No questions, pt transported via wheelchair, tolerated well. [place], [place] M

## 2011-03-29 NOTE — Progress Notes (Signed)
Name: Kayla Sandoval MRN: [id number] DOB: [birth year]    LOS: 2  PCCM FOLLOW UP NOTE  History of Present Illness: 41 y/o female with HTN and depression presented to the [facility] ED on 2/13 with three weeks of dyspnea. She fell approximately 3.5 weeks prior to admission and struck her left rib cage. 3 weeks prior to admission she developed a productive cough which she treated at home with antibiotics she had left over from a prior illness. The productive cough went a way but she remained dyspnic so she went to urgent care on 2/11 and had a CXR showing a large effusion on the left. She was told to go to [facility], but had to "build up the courage" for it and did not show up until tonight. She has not had fever or chills but has noted nighttime sweats. Has lost 30lbs in the last 6 months, has been dieting some. No hemoptysis.   Lines / Drains:  L Chest tube 2/14 >> 2/15  Cultures / Sepsis markers:  2/13 blood cx x2 >> ngtd 2/15 >> 2/13 urine strep, Legionella >> both NEG 2/13 sputum>>> NOS, NOF 2/14 pleural fluid >>   Antibiotics:  2/13 ceftriaxone pneumonia >> 2/14 2/13 azithro pneumonia >>  2/14  Tests / Events:  2/13 CT chest large left effusion with mediastinal shift, collapse of left lung, cannot exclude underlying mass    SUBJ: C/O L chest pain. No resp distress. Minimal chest tube drainage now. Total of approx 3.5 liters have drained since chest tube placed  Vital Signs: Temp:  [98 F (36.7 C)-98.8 F (37.1 C)] 98 F (36.7 C) (02/15 0400) Pulse Rate:  [71-113] 85  (02/15 0800) Resp:  [12-34] 20  (02/15 0800) BP: (105-149)/(68-119) 109/77 mmHg (02/15 0800) SpO2:  [90 %-100 %] 95 % (02/15 0800) I/O last 3 completed shifts: In: 2708.3 [P.O.:960; I.V.:1748.3] Out: 2250 [Urine:1400; Chest Tube:850]   Physical Examination: General: NAD Neuro: awake, alert, appropriate, MAE CV: s1s2 rrr, NSR, mild tachy PULM: resps even, non labored, speaking in full sentences, diminished L GI: abd soft,  +bs Extremities: warm and dry, no edema    Labs and Imaging:   CBC    Component Value Date/Time   WBC 8.5 03/29/2011 0500   RBC 4.09 03/29/2011 0500   HGB 14.7 03/29/2011 0500   HCT 41.4 03/29/2011 0500   PLT 188 03/29/2011 0500   MCV 101.2* 03/29/2011 0500   MCH 35.9* 03/29/2011 0500   MCHC 35.5 03/29/2011 0500   RDW 14.4 03/29/2011 0500   LYMPHSABS 0.5* 03/27/2011 0957   MONOABS 1.2* 03/27/2011 0957   EOSABS 0.1 03/27/2011 0957   BASOSABS 0.0 03/27/2011 0957    BMET    Component Value Date/Time   NA 138 03/29/2011 0500   K 3.4* 03/29/2011 0500   CL 103 03/29/2011 0500   CO2 25 03/29/2011 0500   GLUCOSE 82 03/29/2011 0500   BUN 4* 03/29/2011 0500   CREATININE 0.39* 03/29/2011 0500   CALCIUM 8.8 03/29/2011 0500   GFRNONAA >90 03/29/2011 0500   GFRAA >90 03/29/2011 0500   CXR: L pleural space appears nearly completely drained. Diffuse AS dz on L. No pneumothorax. Chest tube remains well positioned   Assessment and Plan:  Left tension hemothorax - after fall with L chest trauma and contusion 3.5 weeks prior to admit. Effectively drained with chest tube placed 2/14. Approx 3.5 liters of very bloody fluid removed. Cytology pending but doubt malignancy. Minimal drainage now (30 cc in last  3 hrs). Parenchymal infiltrates likely represent re-expansion edema PLAN -  D/C chest tube 2/15 Repeat CT chest to eval lung parenchyma and re-eval pleural space Follow up CXR AM 2/16.  If no further treatment of hemothorax needed and if not requiring O2, she may be discharged 2/16  Will likely need analgesics @ discharge  I have scheduled follow up with Tammy Parrett on Fri 04/05/11 - 1:30 for CXR, 2:00 for appointment   Chest tube site needs to be looked at and sutures removed   HTN - controlled PLAN-  Cont home lisinopril for now  Tobacco abuse -states that she quit 3 weeks ago Plan:  -tobacco cessation counseling ordered   Transfer to med-surg    Billy Fischer, MD;  PCCM service; [place]  [phone]

## 2011-03-29 NOTE — Progress Notes (Signed)
Brief Nutrition Note:  Reason: Nutrition Risk Report-unintentional weight loss  Ht: 63" Wt: 70.7 kg BMI: 27.6   Usual Wt: 63.6 kg % usual wt: 111%  Pt admitted for three weeks of dyspnea; pt treated for large pleural effusion. This was effectively drained with chest tube 2/14; approximately 3.5 liters were removed.  Pt stated she lost 30 lbs one year ago when she resumed taking lezapro. Per pt report, her appetite increased and she was more active upon resuming medication. Pt has been stable at 140 lbs for 5 months PTA. Per pt report, since falling three weeks PTA, pt has been much less active and she thinks she gained a little weight back.  Diet: Heart Healthy. Meal completion of lunch 2/15: 100%  No nutrition issues identified at this time. Please re-consult if needed.  Kayla Sandoval Pager #: [phone]  Kayla Sandoval [phone]

## 2011-03-29 NOTE — Progress Notes (Signed)
UR Completed.  Channell Quattrone Jane [phone] 03/29/2011  

## 2011-03-30 ENCOUNTER — Inpatient Hospital Stay (HOSPITAL_COMMUNITY): Payer: Medicaid Other

## 2011-03-30 DIAGNOSIS — J90 Pleural effusion, not elsewhere classified: Secondary | ICD-10-CM

## 2011-03-30 LAB — CULTURE, RESPIRATORY W GRAM STAIN

## 2011-03-30 IMAGING — CR DG CHEST 1V PORT
1 series · 1 of 1 positions shown · non-contrast
Comparison: Chest radiograph and CT of the chest performed
[DATE]

CLINICAL DATA: Status post removal of left-sided chest tube, with
shortness of breath, and associated chest pain and soft tissue
swelling.

PORTABLE CHEST - 1 VIEW

[AP]
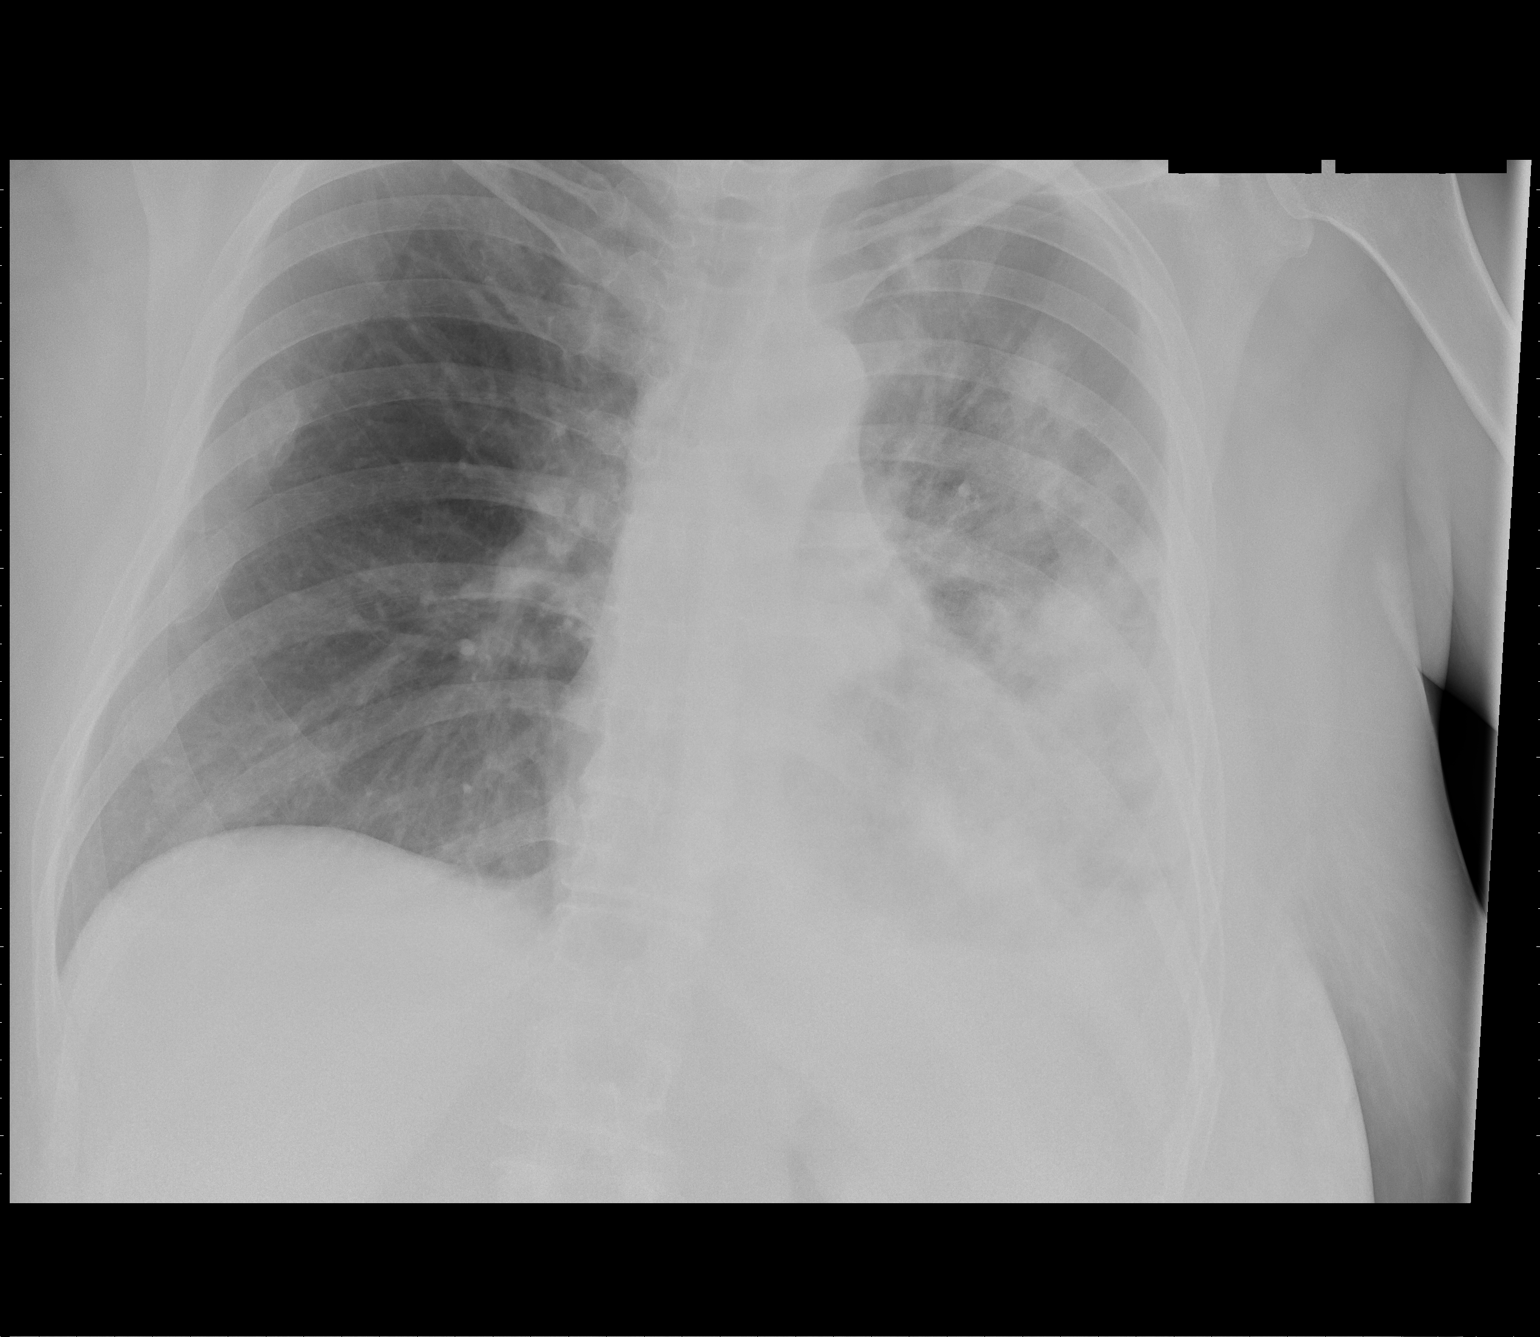

[1 of 1 positions shown; findings below may reference images not displayed]

FINDINGS: No definite pneumothorax is seen.  There is a persistent
small left pleural effusion, as noted on the recent CT.  Extensive
diffuse left-sided airspace opacification is compatible with
multilobar pneumonia.  Previously noted minimal right-sided
airspace disease is less well characterized on radiograph.

The cardiomediastinal silhouette is normal in size.  No acute
osseous abnormalities are identified.
IMPRESSION: 1.  No definite pneumothorax seen.
2.  Persistent small left pleural effusion; extensive diffuse left-
sided airspace opacification again noted, compatible with
multilobar pneumonia.  Right-sided minimal airspace disease is less
well characterized on radiograph.

## 2011-03-30 IMAGING — CR DG CHEST 2V
2 series · 2 of 2 positions shown · non-contrast
Comparison: [DATE]

CLINICAL DATA: Follow-up hemothorax, recent left chest tube
removal.

CHEST - 2 VIEW

[w chest pa]
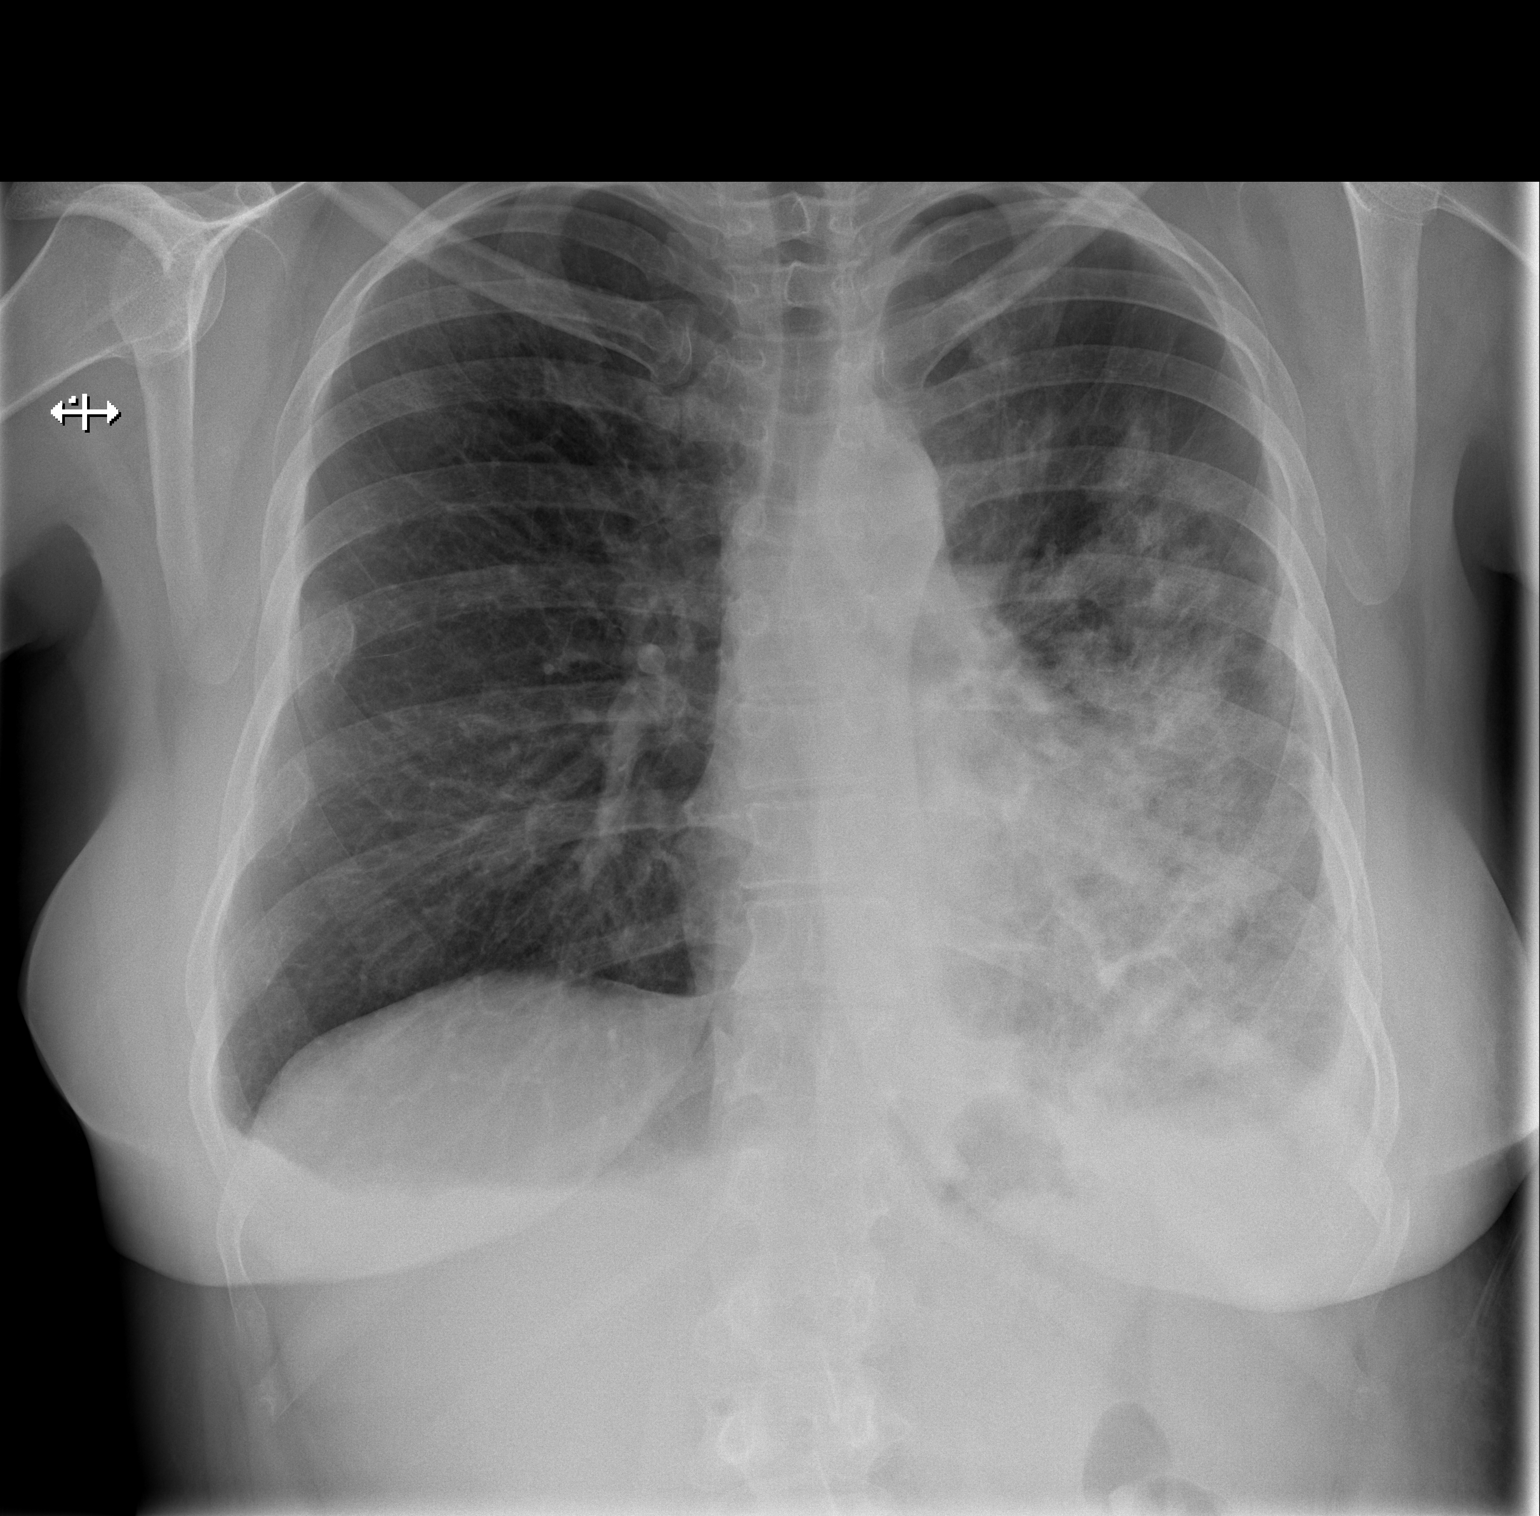

[w chest lat]
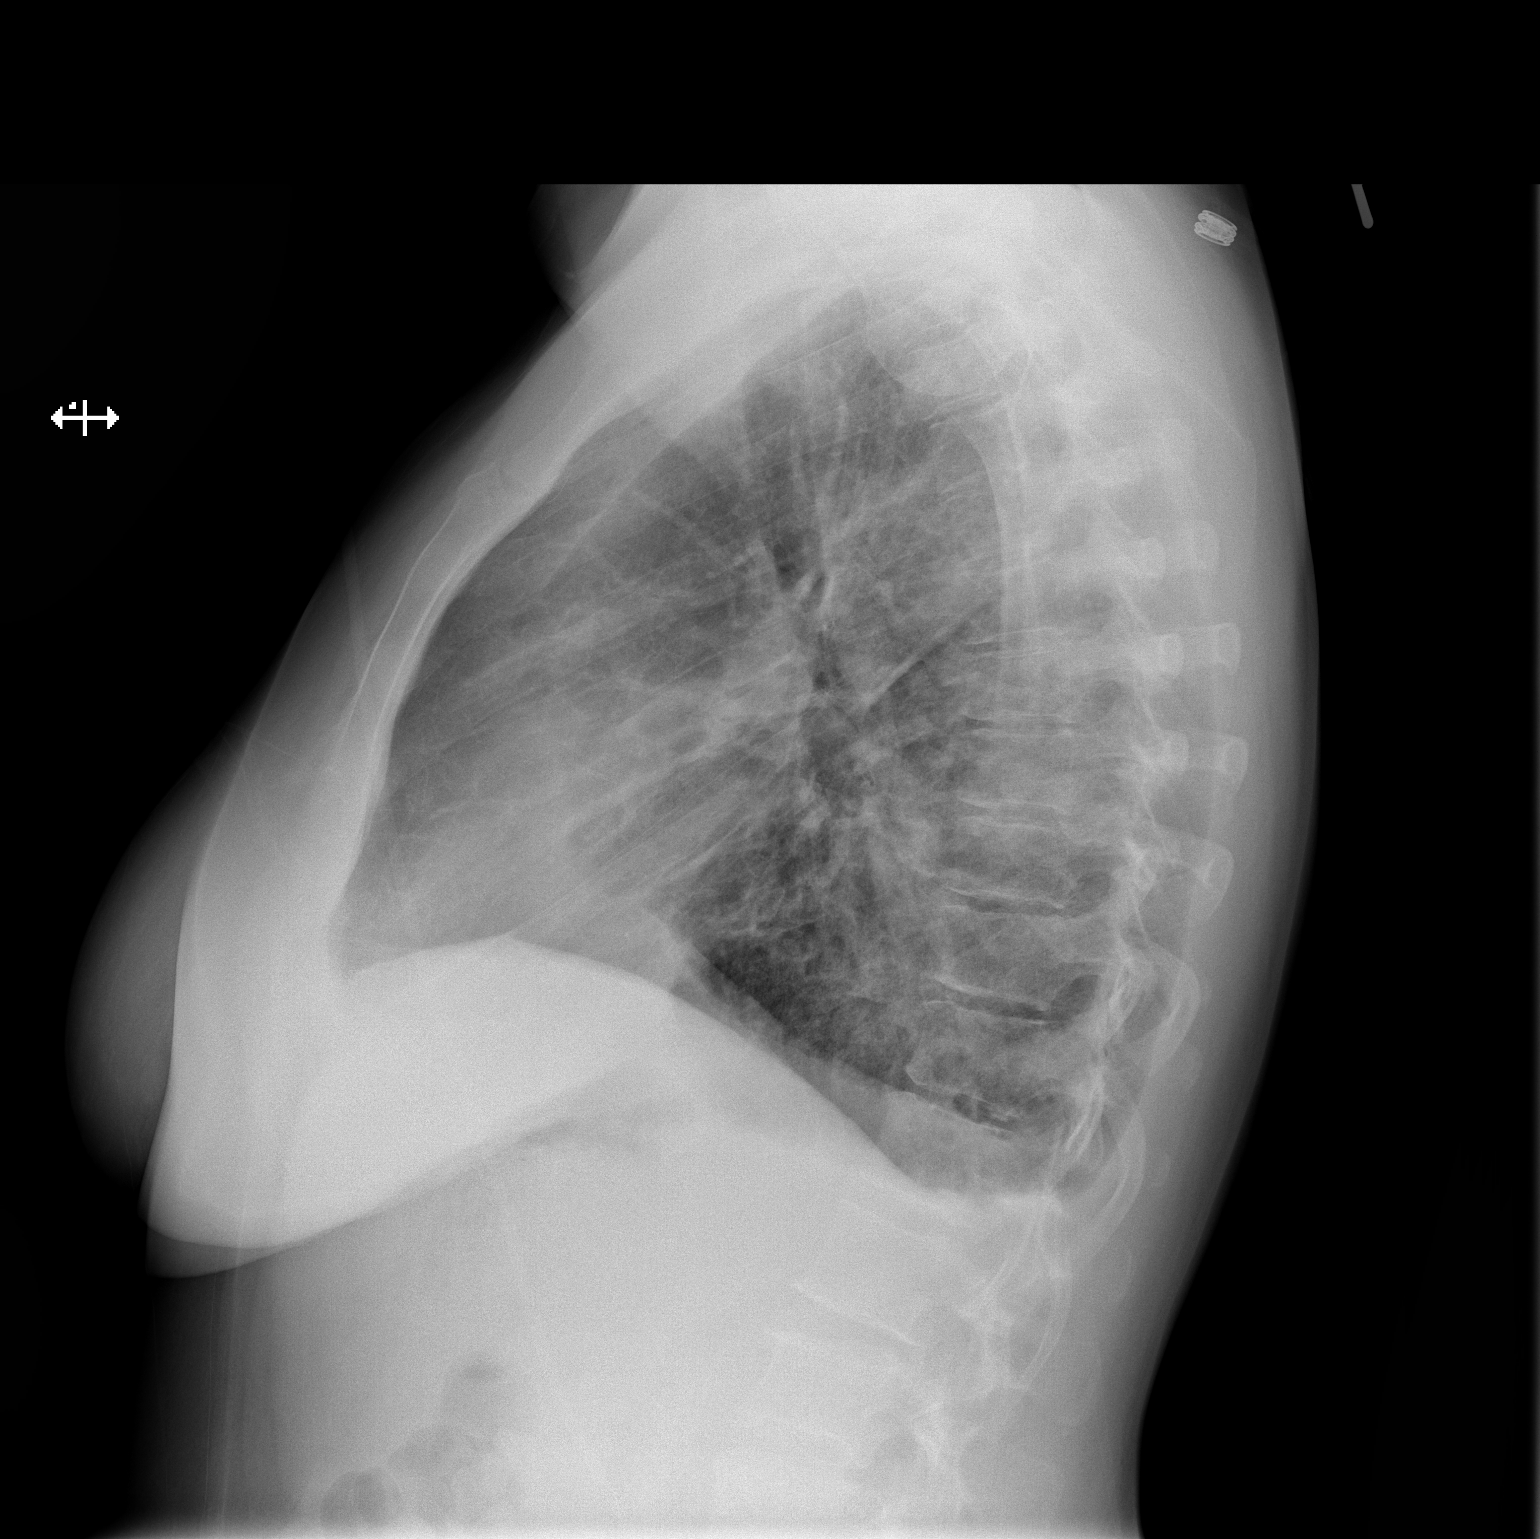

[2 of 2 positions shown; findings below may reference images not displayed]

FINDINGS: Persistent consolidation within the left lung.  Trace
left pleural effusion.  No overt pneumothorax.  Small right pleural
effusion.  Right lung is otherwise clear.  There is evidence of
prior right-sided rib fractures.  No acute osseous abnormality.
Cardiac contours are obscured by the left lung process.
IMPRESSION: The left lung consolidation is similar to prior.  Trace left
pleural effusion.  No overt pneumothorax.

Small right pleural effusion.

## 2011-03-30 MED ORDER — CLONIDINE HCL 0.1 MG PO TABS
0.1000 mg | ORAL_TABLET | Freq: Two times a day (BID) | ORAL | Status: DC
  Administered 2011-03-30 – 2011-03-31 (×3): 0.1 mg via ORAL
  Filled 2011-03-30 (×4): qty 1

## 2011-03-30 MED ORDER — TRAMADOL HCL 50 MG PO TABS
50.0000 mg | ORAL_TABLET | Freq: Four times a day (QID) | ORAL | Status: DC
  Administered 2011-03-30 – 2011-03-31 (×5): 50 mg via ORAL
  Filled 2011-03-30 (×8): qty 1

## 2011-03-30 MED ORDER — MELOXICAM 7.5 MG PO TABS
7.5000 mg | ORAL_TABLET | Freq: Every day | ORAL | Status: DC
  Administered 2011-03-30 – 2011-03-31 (×2): 7.5 mg via ORAL
  Filled 2011-03-30 (×2): qty 1

## 2011-03-30 NOTE — Progress Notes (Signed)
Late Entry. 03/29/2011 23:25pm. Pts. Abdomen on the left side is swollen, reddened, and warm under chest tube dressing site. Chest tube dressing clean , dry and intact. Rapid Response at bedside. On call pulmonologist notified. New order was given.  Kayla Sandoval Edmon Crape Deona Novitski

## 2011-03-30 NOTE — Progress Notes (Deleted)
Pts. Abdomen on the left side is swollen, reddened, and warm under chest tube dressing site. Chest tube dressing is clean , dry and intact. Rapid Response at bedside. On call pulmonologist notified. New order was given.  Halsey Persaud Edmon Crape Tonianne Fine

## 2011-03-30 NOTE — Progress Notes (Signed)
Name: Kayla Sandoval MRN: [id number] DOB: 1970/10/24    LOS: 3  PCCM FOLLOW UP NOTE  History of Present Illness: 41 y/o female with HTN and depression presented to the [facility] ED on 2/13 with three weeks of dyspnea. She fell approximately 3.5 weeks prior to admission and struck her left rib cage. 3 weeks prior to admission she developed a productive cough which she treated at home with antibiotics she had left over from a prior illness. The productive cough went a way but she remained dyspnric so she went to urgent care on 2/11 and had a CXR showing a large effusion on the left. She was told to go to [facility], but had to "build up the courage" for it and did not show up until  2/13. She has not had fever or chills but has noted nighttime sweats. Has lost 30lbs in the last 6 months, has been dieting some. No hemoptysis.   Lines / Drains:  L Chest tube 2/14 >> 2/15  Cultures / Sepsis markers:  2/13 blood cx x2 >>   2/13 urine strep, Legionella >> both neg 2/13 sputum> nl flora  2/14 pleural fluid >>   Antibiotics:  2/13 ceftriaxone pneumonia >> 2/14 2/13 azithro pneumonia >>  2/14  Tests / Events:  2/13 CT chest large left effusion with mediastinal shift, collapse of left lung, cannot exclude underlying mass    SUBJ: Requiring freq IV Fentanyl  Vital Signs: Temp:  [97.6 F (36.4 C)-98.5 F (36.9 C)] 97.6 F (36.4 C) (02/16 0500) Pulse Rate:  [63-97] 63  (02/16 0500) Resp:  [14-20] 20  (02/16 0500) BP: (100-140)/(59-92) 112/77 mmHg (02/16 0500) SpO2:  [96 %-99 %] 96 % (02/16 0846) I/O last 3 completed shifts: In: 1481 [P.O.:1400; I.V.:81] Out: 160 [Chest Tube:160]   Physical Examination: General: NAD Neuro: awake, alert, appropriate, MAE CV: s1s2 rrr, NSR, mild tachy PULM: resps even, non labored, speaking in full sentences, diminished L GI: abd soft, +bs Extremities: warm and dry, no edema   LABS  Lab 03/29/11 0500 03/28/11 0620 03/27/11 1128  NA 138 138 139  K 3.4* 3.4* 3.6    CL 103 104 104  CO2 25 26 --  BUN 4* 3* <3*  CREATININE 0.39* 0.43* 0.50  GLUCOSE 82 120* 98    Lab 03/29/11 0500 03/28/11 0620 03/27/11 1128 03/27/11 0957  HGB 14.7 12.3 13.9 --  HCT 41.4 35.7* 41.0 --  WBC 8.5 6.7 -- 9.5  PLT 188 181 -- 207        Labs CXR:  pcxr 2/16 The left lung consolidation is similar to prior. Trace left  pleural effusion. No overt pneumothorax.  Small right pleural effusion.   Assessment and Plan:  Left tension hemothorax - after fall with L chest trauma and contusion 3.5 weeks prior to admit. Effectively drained with chest tube placed 2/14. Approx 3.5 liters of very bloody fluid removed. Cytology pending but doubt malignancy. Minimal drainage now (30 cc in last 3 hrs). Parenchymal infiltrates likely represent re-expansion edema PLAN -  D/C chest tube 2/15 Repeat CT chest to eval lung parenchyma and re-eval pleural space Follow up CXR AM 2/16 ok but still on 02 and IV Fentanyly  I have scheduled follow up with Tammy Parrett on Fri 04/05/11 - 1:30 for CXR, 2:00 for appointment   Chest tube site needs to be looked at and sutures removed   HTN - controlled PLAN-  Change to clonidine from lisinopril until all pulmonary issues resolved  Tobacco abuse -states that she quit 3 weeks ago Plan:  -tobacco cessation counseling ordered     Sandrea Hughs, MD Pulmonary and Critical Care Medicine [facility] Cell [phone]

## 2011-03-30 NOTE — ED Provider Notes (Signed)
Medical screening examination/treatment/procedure(s) were conducted as a shared visit with non-physician practitioner(s) and myself.  I personally evaluated the patient during the encounter    Shelda Jakes, MD 03/30/11 1355

## 2011-03-31 ENCOUNTER — Inpatient Hospital Stay (HOSPITAL_COMMUNITY): Payer: Medicaid Other

## 2011-03-31 DIAGNOSIS — S271XXA Traumatic hemothorax, initial encounter: Secondary | ICD-10-CM

## 2011-03-31 DIAGNOSIS — J90 Pleural effusion, not elsewhere classified: Secondary | ICD-10-CM

## 2011-03-31 DIAGNOSIS — J9600 Acute respiratory failure, unspecified whether with hypoxia or hypercapnia: Secondary | ICD-10-CM

## 2011-03-31 DIAGNOSIS — J13 Pneumonia due to Streptococcus pneumoniae: Secondary | ICD-10-CM

## 2011-03-31 IMAGING — CR DG CHEST 1V PORT SAME DAY
1 series · 1 of 1 positions shown · non-contrast
Comparison: [DATE]

CLINICAL DATA: Pneumothorax

PORTABLE CHEST - 1 VIEW SAME DAY

[view not recorded]
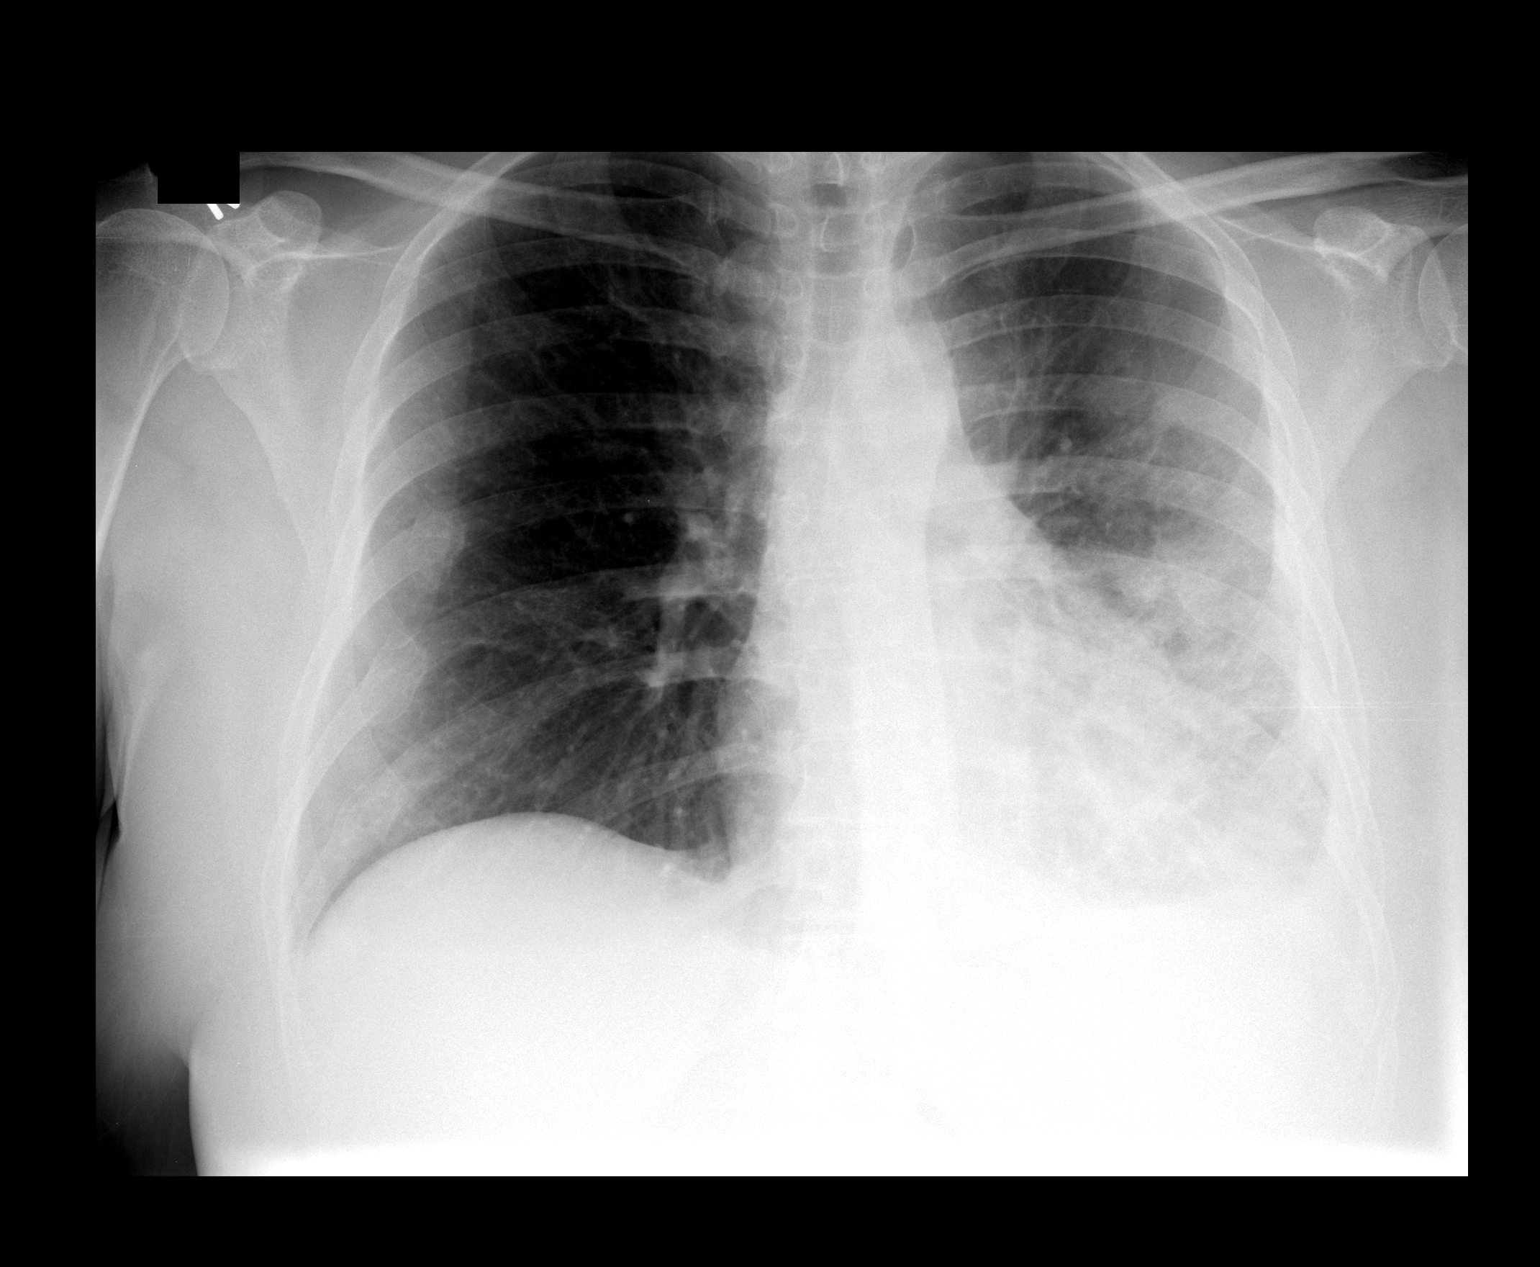

[1 of 1 positions shown; findings below may reference images not displayed]

FINDINGS: Airspace infiltrates throughout the left mid and lower
lung are stable.  Small left effusion as before.  Right lung clear.
Old healed right rib fractures.  Heart size upper limits normal.
No pneumothorax.
IMPRESSION: 1.  Little change in left lower lung airspace disease and left
effusion.

## 2011-03-31 MED ORDER — TRAMADOL HCL 50 MG PO TABS: 50.0000 mg | ORAL_TABLET | Freq: Four times a day (QID) | ORAL | Status: DC | PRN

## 2011-03-31 MED ORDER — POTASSIUM CHLORIDE CRYS ER 20 MEQ PO TBCR
20.0000 meq | EXTENDED_RELEASE_TABLET | Freq: Once | ORAL | Status: AC
  Administered 2011-03-31: 20 meq via ORAL
  Filled 2011-03-31: qty 1

## 2011-03-31 NOTE — Discharge Summary (Signed)
Physician Discharge Summary  Patient ID: Kayla Sandoval MRN: [id number] DOB/AGE: 41-11-1970 40 y.o.  Admit date: 03/27/2011 Discharge date: 03/31/2011  Admission Diagnoses: Left tension hemothorax -   Discharge Diagnoses:   Hx of  Left tension hemothorax secondary to trauma-resolved with Chest tube   Tobacco abuse  Hypertension   Depression  Hospital Course :  41 y/o female with HTN and depression presented to the [facility] ED on 2/13 with three weeks of dyspnea. She fell approximately 3.5 weeks prior to admission and struck her left rib cage. 3 weeks prior to admission she developed a productive cough which she treated at home with antibiotics she had left over from a prior illness. The productive cough went a way but she remained dyspnric so she went to urgent care on 2/11 and had a CXR showing a large effusion on the left. She was told to go to [facility], but had to "build up the courage" for it and did not show up until 2/13. She has not had fever or chills but has noted nighttime sweats. Has lost 30lbs in the last 6 months, has been dieting some. No hemoptysis.  She was admitted by PCCM due a large tension hemothorax. CT chest  On 2/13 showed large left effusion with mediastinal shift, collapse of left lung, cannot exclude underlying mass  A left chest tube was placed 2/14 with sucessful rexpansion and removed on 2/15 .Approx 3.5 liters of very bloody fluid removed over the 24 hr period . Serial CXR have showed reexpansion with residual LLL atx . She has no symptoms of cough/congestion . Empiric abx were stopped due to no signs of PNA -afebrile and nml wbc. CX workup was neg. Pleural fluid showed inflammatory cells with no malignant cells noted.   She has had adequate saturations on room air. Pain controlled with tramadol . She has reached maximum hospital benefit and deemed ready for discharge on 2/17.  She will need OP follow up of chest xray . She is a smoker and was counseled on smoking cesstation.  Cytology was neg for malignant cells and doubt underlying malignancy however will need cxr until total clearance.          Labs at discharge Lab Results  Component Value Date   CREATININE 0.39* 03/29/2011   BUN 4* 03/29/2011   NA 138 03/29/2011   K 3.4* 03/29/2011   CL 103 03/29/2011   CO2 25 03/29/2011   Lab Results  Component Value Date   WBC 8.5 03/29/2011   HGB 14.7 03/29/2011   HCT 41.4 03/29/2011   MCV 101.2* 03/29/2011   PLT 188 03/29/2011   Lab Results  Component Value Date   ALT 61* 05/23/2009   AST 90* 05/23/2009   ALKPHOS 106 05/23/2009   BILITOT 1.4* 05/23/2009   Lab Results  Component Value Date   INR 1.18 03/27/2011   INR 1.11 05/23/2009   INR 1.03 05/20/2009    Current radiology studies Dg Chest 2 View  03/30/2011  *RADIOLOGY REPORT*  Clinical Data: Follow-up hemothorax, recent left chest tube removal.  CHEST - 2 VIEW  Comparison: 03/30/2011  Findings: Persistent consolidation within the left lung.  Trace left pleural effusion.  No overt pneumothorax.  Small right pleural effusion.  Right lung is otherwise clear.  There is evidence of prior right-sided rib fractures.  No acute osseous abnormality. Cardiac contours are obscured by the left lung process.  IMPRESSION: The left lung consolidation is similar to prior.  Trace left pleural effusion.  No overt pneumothorax.  Small right pleural effusion.  Original Report Authenticated By: Waneta Martins, M.D.   Dg Chest Port 1 View  03/30/2011  *RADIOLOGY REPORT*  Clinical Data: Status post removal of left-sided chest tube, with shortness of breath, and associated chest pain and soft tissue swelling.  PORTABLE CHEST - 1 VIEW  Comparison: Chest radiograph and CT of the chest performed 03/29/2011  Findings: No definite pneumothorax is seen.  There is a persistent small left pleural effusion, as noted on the recent CT.  Extensive diffuse left-sided airspace opacification is compatible with multilobar pneumonia.  Previously noted  minimal right-sided airspace disease is less well characterized on radiograph.  The cardiomediastinal silhouette is normal in size.  No acute osseous abnormalities are identified.  IMPRESSION:  1.  No definite pneumothorax seen. 2.  Persistent small left pleural effusion; extensive diffuse left- sided airspace opacification again noted, compatible with multilobar pneumonia.  Right-sided minimal airspace disease is less well characterized on radiograph.  Original Report Authenticated By: Tonia Ghent, M.D.   Dg Chest Port 1v Same Day  03/31/2011  *RADIOLOGY REPORT*  Clinical Data: Pneumothorax  PORTABLE CHEST - 1 VIEW SAME DAY  Comparison: 03/30/2011  Findings: Airspace infiltrates throughout the left mid and lower lung are stable.  Small left effusion as before.  Right lung clear. Old healed right rib fractures.  Heart size upper limits normal. No pneumothorax.  IMPRESSION:  1.  Little change in left lower lung airspace disease and left effusion.  Original Report Authenticated By: Osa Craver, M.D.    Disposition: ready for discharge on 03/31/11     Discharge Orders    Future Appointments: Provider: Department: Dept Phone: Center:   04/05/2011 2:00 PM Rubye Oaks, NP [facility] [phone] None     Medication List  As of 03/31/2011  2:53 PM   ASK your doctor about these medications         aspirin 325 MG tablet   Take 325 mg by mouth daily as needed. As needed for pain.      escitalopram 10 MG tablet   Commonly known as: LEXAPRO   Take 10 mg by mouth daily.      ibuprofen 200 MG tablet   Commonly known as: ADVIL,MOTRIN   Take 600 mg by mouth every 6 (six) hours as needed.      lisinopril 20 MG tablet   Commonly known as: PRINIVIL,ZESTRIL   Take 20 mg by mouth daily.           Follow-up Information    Follow up with  pulmonary on 04/05/11 at 1400.          Discharged Condition:  Improved   Signed: PARRETT,TAMMY NP -C  03/31/2011, 2:53  PM   Discussed with pt need for f/u cxr and ov  Sandrea Hughs, MD Pulmonary and Critical Care Medicine [facility] Cell [phone]

## 2011-03-31 NOTE — Progress Notes (Signed)
Kayla Sandoval to be D/C'd Home per MD order.  Discussed with the patient and all questions fully answered.   Kayla Sandoval, Kayla Sandoval  Home Medication Instructions ZOX:[id number]   Printed on:03/31/11 1710  Medication Information                    lisinopril (PRINIVIL,ZESTRIL) 20 MG tablet Take 20 mg by mouth daily.           escitalopram (LEXAPRO) 10 MG tablet Take 10 mg by mouth daily.           ibuprofen (ADVIL,MOTRIN) 200 MG tablet Take 600 mg by mouth every 6 (six) hours as needed.           traMADol (ULTRAM) 50 MG tablet Take 1 tablet (50 mg total) by mouth every 6 (six) hours as needed for pain.           cloNIDine (CATAPRES) 0.1 MG tablet Take 1 tablet (0.1 mg total) by mouth 2 (two) times daily.             VVS, Skin clean, dry and intact without evidence of skin break down, no evidence of skin tears noted. IV catheter discontinued intact. Site without signs and symptoms of complications. Dressing and pressure applied.  An After Visit Summary was printed and given to the patient. Follow up appointments , new prescriptions and medication administration times given Patient escorted via WC, and D/C home via private auto.  Cindra Eves, RN 03/31/2011 5:10 PM

## 2011-03-31 NOTE — Discharge Instructions (Signed)
Pleurisy Pleurisy is an irritation (inflammation) and puffiness (swelling) of the lining of the lungs. It is often caused by an existing infection or disease. It can be hard to breathe and hurt to breathe. Coughing or deep breathing will make it hurt more. HOME CARE  Only take medicine as told by your doctor.   Take your medicines (antibiotics) as told. Finish them even if you start to feel better.   Use a cool mist vaporizer to loosen mucus.  GET HELP RIGHT AWAY IF:   Your lips, fingernails, or toenails are blue or dark.   You cough up blood.   You have a hard time breathing.   Your pain is not controlled with medicine or it lasts for more than 1 week.   Your pain spreads (radiates) into your neck, arms, or jaw.   You are short of breath or wheezing.   You develop a fever, rash, throw up (vomit), or faint.   Your pain gets worse.   You cough up more yellowish white (pus-like) mucus.  MAKE SURE YOU: Pleural Effusion The lining covering your lungs and the inside of your chest is called the pleura. Usually, the space between the 2 pleura contains no air and only a thin layer of fluid. A pleural effusion is an abnormal buildup of fluid in the pleural space. Fluid gathers when there is increased pressure in the lung vessels. This forces fluids out of the lungs and into the pleural space. Vessels may also leak fluids when there are infections, such as pneumonia, or other causes of soreness and redness (inflammation). Fluids leak into the lungs when protein in the blood is low or when certain vessels (lymphatics) are blocked. Finding a pleural effusion is important because it is usually caused by another disease. In order to treat a pleural effusion, your caregiver needs to find its cause. If left untreated, a large amount of fluid can build up and cause collapse of the lung. CAUSES   Heart failure.   Infections (pneumonia, tuberculosis), pulmonary embolism, pulmonary infarction.    Cancer (primary lung and metastatic), asbestosis.   Liver failure (cirrhosis).   Nephrotic syndrome, peritoneal dialysis, kidney problems (uremia).   Collagen vascular disease (systemic lupus erythematosis, rheumatoid arthritis).   Injury (trauma) to the chest or rupture of the digestive tube (esophagus).   Material in the chest or pleural space (hemothorax, chylothorax).   Pancreatitis.   Surgery.   Drug reactions.  SYMPTOMS  A pleural effusion can decrease the amount of space available for breathing and make you short of breath. The fluid can become infected, which may cause pain and fever. Often, the pain is worse when taking a deep breath. The underlying disease (heart failure, pneumonia, blood clot, tuberculosis, cancer) may also cause symptoms. DIAGNOSIS   Your caregiver can usually tell what is wrong by talking to you (taking a history), doing an exam, and taking a routine X-ray. If the X-ray shows fluid in your chest, often fluid is removed from your chest with a needle for testing (diagnostic thoracentesis).   Sometimes, more specialized X-rays may be needed.   Sometimes, a small piece of tissue is removed and examined by a specialist (biopsy).  TREATMENT  Treatment varies based on what caused the pleural effusion. Treatments include:  Removing as much fluid as possible using a needle (thoracentesis) to improve the cough and shortness of breath. This is a simple procedure which can be done at bedside. The risks are bleeding, infection, collapse of a  lung, or low blood pressure.   Placing a tube in the chest to drain the effusion (tube thoracostomy). This is often used when there is an infection in the fluid. This is a simple procedure which can often be done at bedside or in a clinic. The procedure may be painful. The risks are the same as using a needle to drain the fluid. The chest tube usually remains for a few days and is connected to suction to improve fluid drainage.  The tube, after placement, usually does not cause much discomfort.   Surgical removal of fibrous debris in and around the pleural space (decortication). This may be done with a flexible telescope (thoracoscope) through a small or large cut (incision). This is helpful for patients who have fibrosis or scar tissue that prevents complete lung expansion. The risks are infection, blood loss, and side effects from general anesthesia.   Sometimes, a procedure called pleurodesis is done. A chest tube is placed and the fluid is drained. Next, an agent (tetracycline, talc powder) is added to the pleural space. This causes the lung and chest wall to stick together (adhesion). This leaves no potential space for fluid to build up. The risks include infection, blood loss, and side effects from general anesthesia.   If the effusion is caused by infection, it may be treated with antibiotics and improve without draining.  HOME CARE INSTRUCTIONS   Take any medicines exactly as prescribed.   Follow up with your caregiver as directed.   Monitor your exercise capacity (the amount of walking you can do before you get short of breath).   Do not smoke. Ask your caregiver for help quitting.  SEEK MEDICAL CARE IF:   Your exercise capacity seems to get worse or does not improve with time.   You do not recover from your illness.  SEEK IMMEDIATE MEDICAL CARE IF:   Shortness of breath or chest pain develops or gets worse.   You have an oral temperature above 102 F (38.9 C), not controlled by medicine.   You develop a new cough, especially if the mucus (phlegm) is discolored.  MAKE SURE YOU:   Understand these instructions.   Will watch your condition.   Will get help right away if you are not doing well or get worse.  Document Released: 01/28/2005 Document Revised: 10/10/2010 Document Reviewed: 09/19/2006 [facility] Patient Information 2012 [place], [place].  Understand these instructions.   Will watch your  condition.   Will get help right away if you are not doing well or get worse.  Document Released: 01/11/2008 Document Revised: 10/10/2010 Document Reviewed: 04/27/2009 [facility] Patient Information 2012 [place], [place].Pleural Effusion The lining covering your lungs and the inside of your chest is called the pleura. Usually, the space between the 2 pleura contains no air and only a thin layer of fluid. A pleural effusion is an abnormal buildup of fluid in the pleural space. Fluid gathers when there is increased pressure in the lung vessels. This forces fluids out of the lungs and into the pleural space. Vessels may also leak fluids when there are infections, such as pneumonia, or other causes of soreness and redness (inflammation). Fluids leak into the lungs when protein in the blood is low or when certain vessels (lymphatics) are blocked. Finding a pleural effusion is important because it is usually caused by another disease. In order to treat a pleural effusion, your caregiver needs to find its cause. If left untreated, a large amount of fluid can build up  and cause collapse of the lung. CAUSES   Heart failure.   Infections (pneumonia, tuberculosis), pulmonary embolism, pulmonary infarction.   Cancer (primary lung and metastatic), asbestosis.   Liver failure (cirrhosis).   Nephrotic syndrome, peritoneal dialysis, kidney problems (uremia).   Collagen vascular disease (systemic lupus erythematosis, rheumatoid arthritis).   Injury (trauma) to the chest or rupture of the digestive tube (esophagus).   Material in the chest or pleural space (hemothorax, chylothorax).   Pancreatitis.   Surgery.   Drug reactions.  SYMPTOMS  A pleural effusion can decrease the amount of space available for breathing and make you short of breath. The fluid can become infected, which may cause pain and fever. Often, the pain is worse when taking a deep breath. The underlying disease (heart failure, pneumonia,  blood clot, tuberculosis, cancer) may also cause symptoms. DIAGNOSIS   Your caregiver can usually tell what is wrong by talking to you (taking a history), doing an exam, and taking a routine X-ray. If the X-ray shows fluid in your chest, often fluid is removed from your chest with a needle for testing (diagnostic thoracentesis).   Sometimes, more specialized X-rays may be needed.   Sometimes, a small piece of tissue is removed and examined by a specialist (biopsy).  TREATMENT  Treatment varies based on what caused the pleural effusion. Treatments include:  Removing as much fluid as possible using a needle (thoracentesis) to improve the cough and shortness of breath. This is a simple procedure which can be done at bedside. The risks are bleeding, infection, collapse of a lung, or low blood pressure.   Placing a tube in the chest to drain the effusion (tube thoracostomy). This is often used when there is an infection in the fluid. This is a simple procedure which can often be done at bedside or in a clinic. The procedure may be painful. The risks are the same as using a needle to drain the fluid. The chest tube usually remains for a few days and is connected to suction to improve fluid drainage. The tube, after placement, usually does not cause much discomfort.   Surgical removal of fibrous debris in and around the pleural space (decortication). This may be done with a flexible telescope (thoracoscope) through a small or large cut (incision). This is helpful for patients who have fibrosis or scar tissue that prevents complete lung expansion. The risks are infection, blood loss, and side effects from general anesthesia.   Sometimes, a procedure called pleurodesis is done. A chest tube is placed and the fluid is drained. Next, an agent (tetracycline, talc powder) is added to the pleural space. This causes the lung and chest wall to stick together (adhesion). This leaves no potential space for fluid to  build up. The risks include infection, blood loss, and side effects from general anesthesia.   If the effusion is caused by infection, it may be treated with antibiotics and improve without draining.  HOME CARE INSTRUCTIONS   Take any medicines exactly as prescribed.   Follow up with your caregiver as directed.   Monitor your exercise capacity (the amount of walking you can do before you get short of breath).   Do not smoke. Ask your caregiver for help quitting.  SEEK MEDICAL CARE IF:   Your exercise capacity seems to get worse or does not improve with time.   You do not recover from your illness.  SEEK IMMEDIATE MEDICAL CARE IF:   Shortness of breath or chest pain develops or  gets worse.   You have an oral temperature above 102 F (38.9 C), not controlled by medicine.   You develop a new cough, especially if the mucus (phlegm) is discolored.  MAKE SURE YOU:   Understand these instructions.   Will watch your condition.   Will get help right away if you are not doing well or get worse.  Document Released: 01/28/2005 Document Revised: 10/10/2010 Document Reviewed: 09/19/2006 [facility] Patient Information 2012 [place], [place].

## 2011-03-31 NOTE — Progress Notes (Signed)
Name: Kayla Sandoval MRN: [id number] DOB: 1970/03/23    LOS: 4  PCCM FOLLOW UP NOTE  History of Present Illness: 41 y/o female with HTN and depression presented to the [facility] ED on 2/13 with three weeks of dyspnea. She fell approximately 3.5 weeks prior to admission and struck her left rib cage. 3 weeks prior to admission she developed a productive cough which she treated at home with antibiotics she had left over from a prior illness. The productive cough went a way but she remained dyspnric so she went to urgent care on 2/11 and had a CXR showing a large effusion on the left. She was told to go to [facility], but had to "build up the courage" for it and did not show up until  2/13. She has not had fever or chills but has noted nighttime sweats. Has lost 30lbs in the last 6 months, has been dieting some. No hemoptysis.   Lines / Drains:  L Chest tube 2/14 >> 2/15  Cultures / Sepsis markers:  2/13 blood cx x2 >>   2/13 urine strep, Legionella >> both neg 2/13 sputum> nl flora  2/14 pleural fluid >> inflamm cells , no malignant cells   Antibiotics:  2/13 ceftriaxone pneumonia >> 2/14 2/13 azithro pneumonia >>  2/14  Tests / Events:  2/13 CT chest large left effusion with mediastinal shift, collapse of left lung, cannot exclude underlying mass    SUBJ: Decreased pain with tramadol  No cxr done this am.   Vital Signs: Temp:  [98 F (36.7 C)-98.3 F (36.8 C)] 98.3 F (36.8 C) (02/17 0512) Pulse Rate:  [71-88] 71  (02/17 0512) Resp:  [17-19] 17  (02/17 0512) BP: (96-121)/(63-80) 112/76 mmHg (02/17 0912) SpO2:  [95 %-99 %] 99 % (02/17 0512) I/O last 3 completed shifts: In: 1166 [P.O.:1000; I.V.:166] Out: -    Physical Examination: General: NAD Neuro: awake, alert, appropriate, MAE CV: s1s2 rrr, NSR, mild tachy PULM: resps even, non labored, speaking in full sentences, diminished L GI: abd soft, +bs Extremities: warm and dry, no edema   LABS  Lab 03/29/11 0500 03/28/11 0620 03/27/11  1128  NA 138 138 139  K 3.4* 3.4* 3.6  CL 103 104 104  CO2 25 26 --  BUN 4* 3* <3*  CREATININE 0.39* 0.43* 0.50  GLUCOSE 82 120* 98    Lab 03/29/11 0500 03/28/11 0620 03/27/11 1128 03/27/11 0957  HGB 14.7 12.3 13.9 --  HCT 41.4 35.7* 41.0 --  WBC 8.5 6.7 -- 9.5  PLT 188 181 -- 207        Labs CXR:  pcxr 2/16 The left lung consolidation is similar to prior. Trace left  pleural effusion. No overt pneumothorax.  Small right pleural effusion.   Assessment and Plan:  Left tension hemothorax - after fall with L chest trauma and contusion 3.5 weeks prior to admit. Effectively drained with chest tube placed 2/14. Approx 3.5 liters of very bloody fluid removed. Cytology pending but doubt malignancy. Minimal drainage now (30 cc in last 3 hrs). Parenchymal infiltrates likely represent re-expansion edema CT removed on 2/15  PLAN -  - check xray now ,  Suspect able to discharge later today or in am.  -pain control   I have scheduled follow up with Tammy Parrett on Fri 04/05/11 - 1:30 for CXR, 2:00 for appointment   Chest tube site needs to be looked at and sutures removed   HTN - controlled Changed to  clonidine from  lisinopril until all pulmonary issues resolved  PLAN-  Follow    Tobacco abuse -states that she quit 03/15/2011  Plan:  -tobacco cessation counseling ordered     PARRETT,TAMMY NP-C  [facility] pulmonary /CCM  [phone]   Pt independently  seen and examined and available cxr's reviewed and I agree with above findings/ imp/ plan  Sandrea Hughs, MD Pulmonary and Critical Care Medicine [facility] Cell [phone]

## 2011-04-01 ENCOUNTER — Telehealth: Payer: Self-pay | Admitting: Adult Health

## 2011-04-01 NOTE — Telephone Encounter (Signed)
lmomtcb x1 for mary

## 2011-04-01 NOTE — Telephone Encounter (Signed)
Sorry she was not on klonopin during hospital stay  He home meds said clonidine for b/p- we continued this at discharge .  The only med I discharged her on was tramadol which was sent to [facility] , please verify it was received.  Please contact office for sooner follow up if symptoms do not improve or worsen or seek emergency care

## 2011-04-01 NOTE — Telephone Encounter (Signed)
As I said before -her hospital records said that she was on this  It may have been misinterpreted on admission.  Apologize for any confusion .  i did not write for clonidine , i just said to cont on this from home list.  So if she is not suppose to be -none was sent  I will not be rx klonopin . She will have to call pcp if this is what she was on or wants   follow up as planned and As needed  Please contact office for sooner follow up if symptoms do not improve or worsen or seek emergency care

## 2011-04-01 NOTE — Telephone Encounter (Signed)
I I spoke with mary and advised her of TP recs. She states they did receive rx for the tramadol. Also she states she doesn't;t not understand why pt is on the clonidine for b/p since she is also on lisinopril. She stated she is not trying to be difficult she just wants to understand. Please advise Tammy, thanks

## 2011-04-01 NOTE — Telephone Encounter (Signed)
Spoke with Corrie Dandy, pt's mother. She states that the pt was d/c'ed on klonopin and no rx was ever sent to pharm. I thought at first she meant clonidine, since this is actually on the med list, but she states needing klonopin or clonazepam. I do not see this on the med list. She wants rx sent to [facility]. TP, please advise if okay to send rx, thanks

## 2011-04-02 ENCOUNTER — Telehealth: Payer: Self-pay | Admitting: Adult Health

## 2011-04-02 NOTE — Telephone Encounter (Signed)
Called, spoke with [facility].   I informed her that her hospital records said that pt was on clonidine but is may have been misinterpreted on admission.  I apologized for any confusion and advised Corrie Dandy that TP did not write for clonidine; so if pt is not suppose to be none was sent. She verbalized understanding of this and also stated pt was not on klonopin either - she was not not calling for klonopin rx  But was calling to clarify the clonidine.  She is aware to have pt keep OV with TP on Friday, Feb 22 at 2pm and to call office back if anything further is needed.

## 2011-04-02 NOTE — Telephone Encounter (Signed)
LMTCB Please see the last phone note!

## 2011-04-03 LAB — CULTURE, BLOOD (ROUTINE X 2): Culture  Setup Time: 201302140458

## 2011-04-03 NOTE — Telephone Encounter (Signed)
lmomtcb  

## 2011-04-03 NOTE — Telephone Encounter (Signed)
Per 2.18.13 phone note:  PARRETT,TAMMY, NP 04/01/2011 2:18 PM Signed  As I said before -her hospital records said that she was on this  It may have been misinterpreted on admission.  Apologize for any confusion .  i did not write for clonidine , i just said to cont on this from home list.  So if she is not suppose to be -none was sent  I will not be rx klonopin . She will have to call pcp if this is what she was on or wants   On 2.19.13 Crystal spoke with [facility] and informed her of this and advised her to have the pt keep the 2.22.13 appt with TP.  ---  Corrie Dandy returned call from this message.  Per Corrie Dandy, still having some confusion about the clonidine as pt was started on this medication in the hosp and did not receive a script for it at discharge.  No PCP to discuss this with either.  Advised this can be discussed at upcoming ov.  Corrie Dandy also reported that pt does not have insurance and asked about payment for upcoming appt.  Advised that we do give a discount for cash pay at day of visit and that our providers keep the lack of insurance in mind when billing.  Corrie Dandy okay with this and will ensure pt keeps 2.22.13 appt with TP.  Nothing further needed.

## 2011-04-05 ENCOUNTER — Ambulatory Visit (INDEPENDENT_AMBULATORY_CARE_PROVIDER_SITE_OTHER): Payer: Self-pay | Admitting: Adult Health

## 2011-04-05 ENCOUNTER — Encounter: Payer: Self-pay | Admitting: Adult Health

## 2011-04-05 ENCOUNTER — Ambulatory Visit (INDEPENDENT_AMBULATORY_CARE_PROVIDER_SITE_OTHER)
Admission: RE | Admit: 2011-04-05 | Discharge: 2011-04-05 | Disposition: A | Discharge status: Home / Self Care | Payer: Self-pay | Source: Ambulatory Visit | Attending: Adult Health | Admitting: Adult Health

## 2011-04-05 VITALS — BP 114/76 | HR 82 | Temp 98.2°F | Ht 64.0 in | Wt 142.6 lb

## 2011-04-05 DIAGNOSIS — J90 Pleural effusion, not elsewhere classified: Secondary | ICD-10-CM

## 2011-04-05 DIAGNOSIS — J942 Hemothorax: Secondary | ICD-10-CM

## 2011-04-05 IMAGING — CR DG CHEST 2V
2 series · 2 of 2 positions shown · non-contrast
Comparison: Portable chest x-ray of [DATE]

CLINICAL DATA: Hemothorax, follow-up

CHEST - 2 VIEW

[view not recorded (1 of 2)]
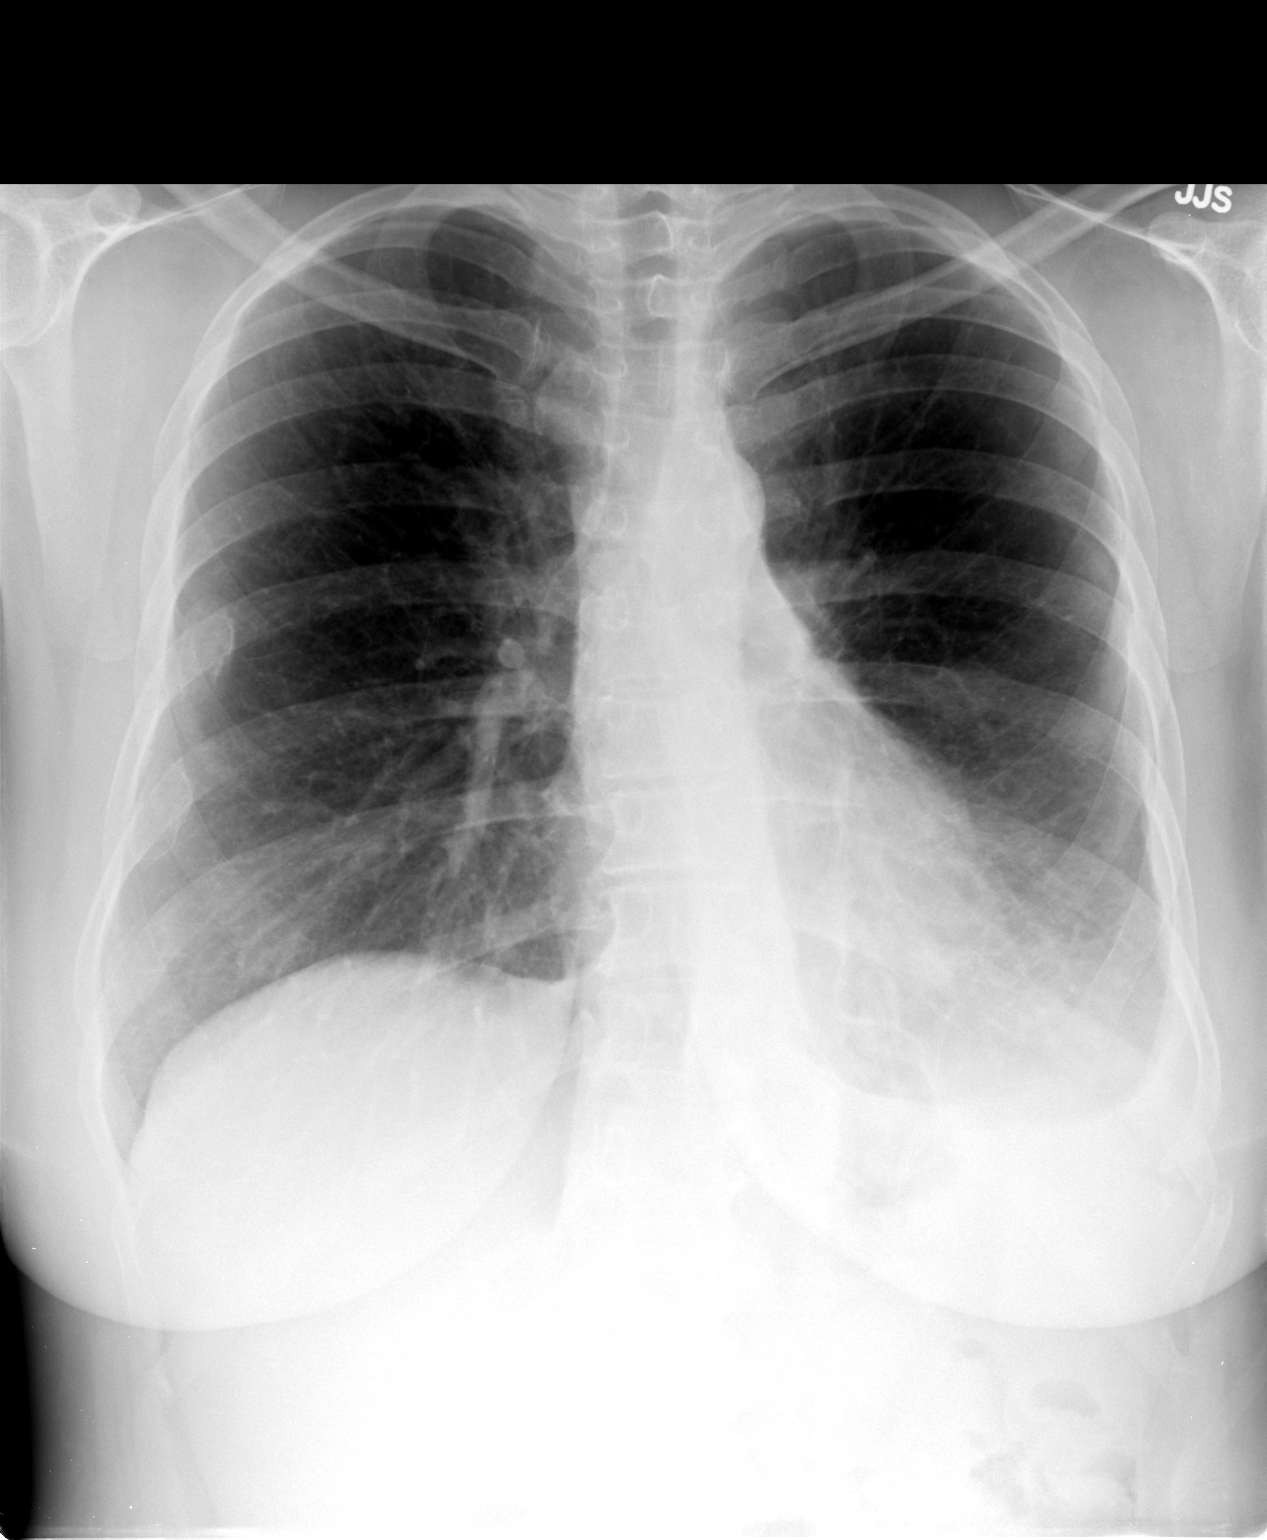

[view not recorded (2 of 2)]
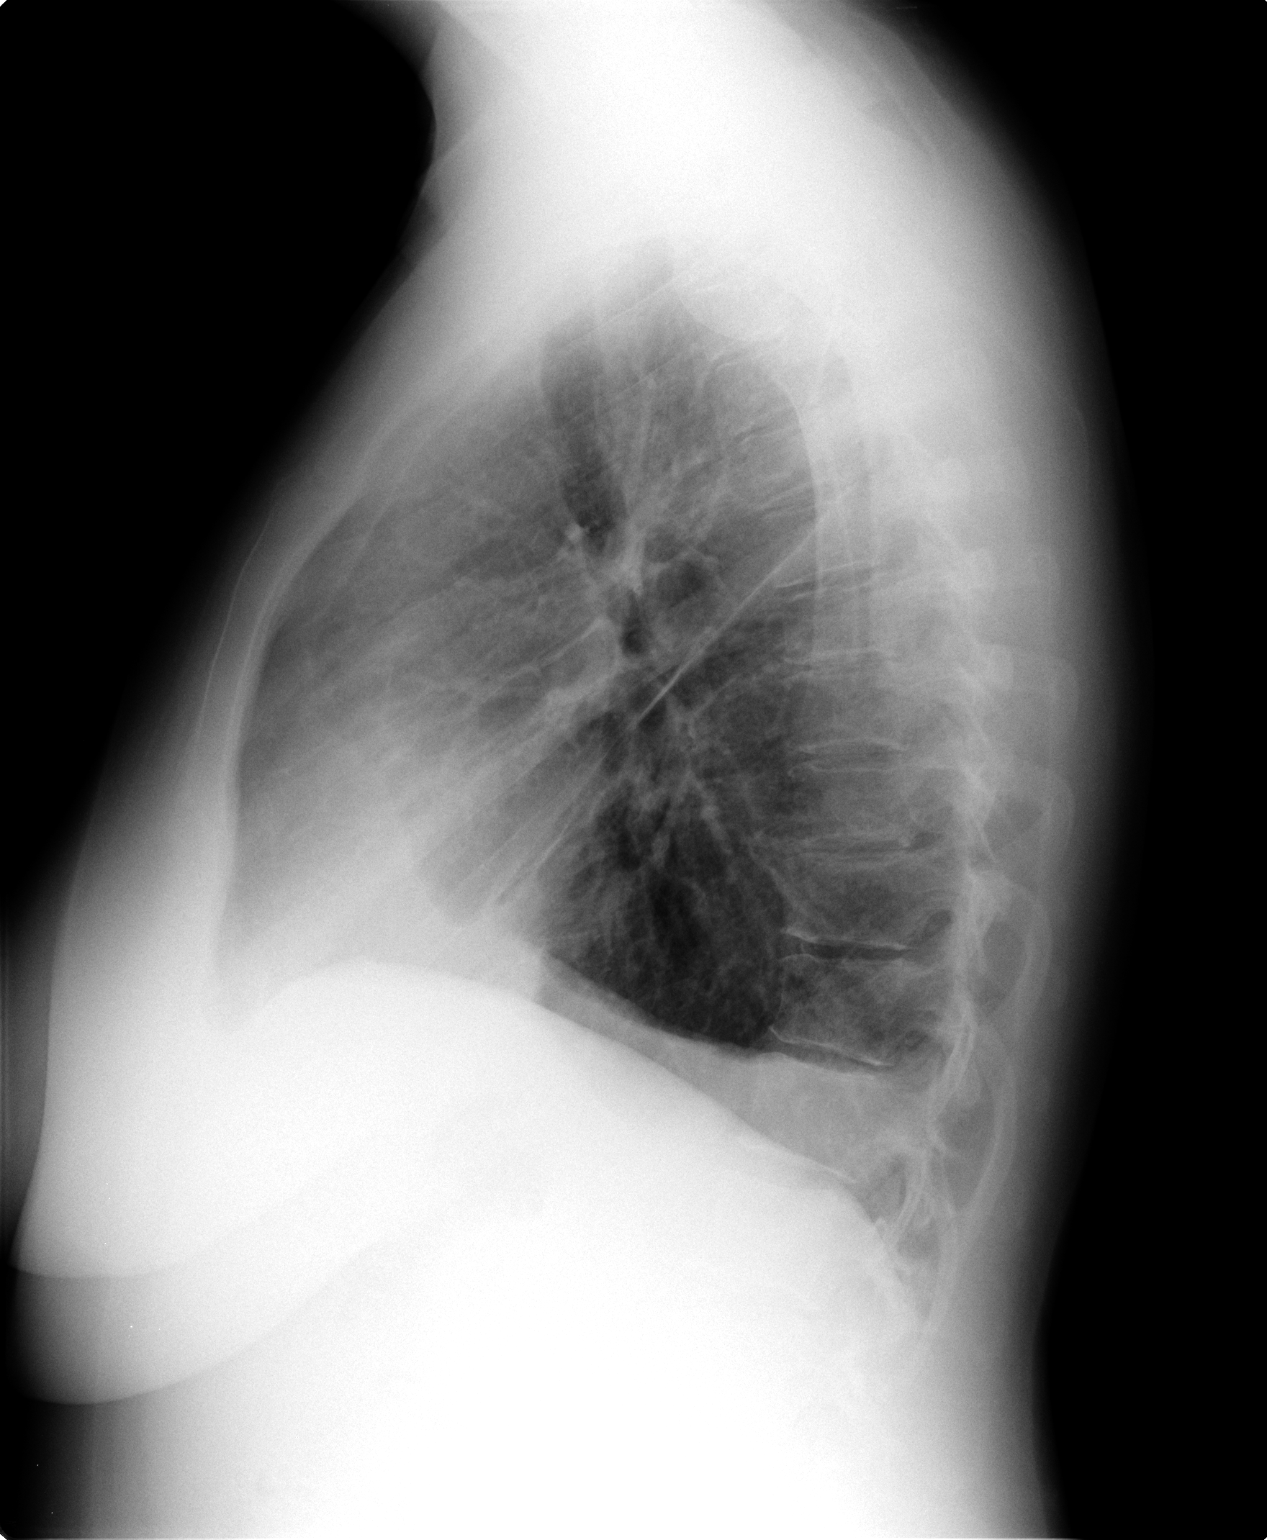

[2 of 2 positions shown; findings below may reference images not displayed]

FINDINGS: Aeration of the left lung base has improved slightly.
There are still prominent markings with blunting of the left
costophrenic angle consistent with small left effusion and basilar
atelectasis.  The right lung is clear.  Heart size is stable.  Old
healed right rib fractures are noted.
IMPRESSION: Better aeration of the left lung base.  Persistent pleural and
intimal opacity consistent with left pleural effusion and left
basilar atelectasis.

## 2011-04-05 MED ORDER — TRAMADOL HCL 50 MG PO TABS: 50.0000 mg | ORAL_TABLET | Freq: Four times a day (QID) | ORAL | Status: DC | PRN

## 2011-04-05 NOTE — Patient Instructions (Signed)
We  Are proud of you for not smoking.  Wash area with soap and water, pat dry and apply bandage until completely healed.  Call for redness, fever or drainage.  Warm heat to ribs.  follow up Dr. Sherene Sires  In 3 weeks and As needed   Please contact office for sooner follow up if symptoms do not improve or worsen or seek emergency care  May take tramadol for pain As needed  -we sent prescription to your pharmacy.

## 2011-04-09 DIAGNOSIS — J942 Hemothorax: Secondary | ICD-10-CM | POA: Insufficient documentation

## 2011-04-09 HISTORY — DX: Hemothorax: J94.2

## 2011-04-09 NOTE — Assessment & Plan Note (Addendum)
S/p traumatic fall with subsequent large left hemothorax that has improved significantly s/p chest tube Pleural fluid w/ no malignant cells.  Pt encouraged on smoking cesstation  Serial cxr with improved aeration and reexpansion. Will have her return for follow up cxr to document total clearance.  Sutures removed without difficulty, wound care instructions given   Plan:  We  Are proud of you for not smoking.  Wash area with soap and water, pat dry and apply bandage until completely healed.  Call for redness, fever or drainage.  Warm heat to ribs.  follow up Dr. Sherene Sires  In 3 weeks and As needed   Please contact office for sooner follow up if symptoms do not improve or worsen or seek emergency care  May take tramadol for pain As needed  -we sent prescription to your pharmacy.

## 2011-04-09 NOTE — Progress Notes (Signed)
  Subjective:    Patient ID: Kayla Sandoval, female    DOB: [birth year], 41 y.o.   MRN: [id number]  HPI 41 y/o female smoker seen for initial PCCM during hospitalization for traumatic left hemothorax 03/27/11.   04/05/11 Post Hospital follow up   Pt presents for a post hospital follow up . Pt was admitted 2/13 for severe dyspnea.  She fell approximately 3.5 weeks prior to admission and struck her left rib cage. She was found to have a large left effusion.  CT chest  showed large left effusion with mediastinal shift, collapse of left lung, cannot exclude underlying mass . A left chest tube was placed 2/14 with sucessful rexpansion and removed on 2/15 .Approx 3.5 liters of very bloody fluid removed over the 24 hr period . Serial CXR have showed reexpansion with residual LLL atx .   CX workup was neg. Pleural fluid showed inflammatory cells with no malignant cells noted.  Since discharge dyspnea is improved but she continues to have pain. Is using Tramadol with relief.  Did have some drainage at previous chest tube site yesterday. No fever or redness.  No hemoptysis, edema or cough.  CXR today shows improved aeration.  Encouraged on smoking cesstation   Review of Systems Constitutional:   No  weight loss, night sweats,  Fevers, chills, + fatigue, or  lassitude.  HEENT:   No headaches,  Difficulty swallowing,  Tooth/dental problems, or  Sore throat,                No sneezing, itching, ear ache, nasal congestion, post nasal drip,   CV:  No chest pain,  Orthopnea, PND, swelling in lower extremities, anasarca, dizziness, palpitations, syncope.   GI  No heartburn, indigestion, abdominal pain, nausea, vomiting, diarrhea, change in bowel habits, loss of appetite, bloody stools.   Resp:   No coughing up of blood.  No change in color of mucus.  No wheezing.  No chest wall deformity  Skin: no rash or lesions.  GU: no dysuria, change in color of urine, no urgency or frequency.  No flank pain, no  hematuria   MS:  No joint pain or swelling.  No decreased range of motion.     Psych:  No change in mood or affect. No depression or anxiety.  No memory loss.         Objective:   Physical Exam GEN: A/Ox3; pleasant , NAD, well nourished   HEENT:  /AT,  EACs-clear, TMs-wnl, NOSE-clear, THROAT-clear, no lesions, no postnasal drip or exudate noted.   NECK:  Supple w/ fair ROM; no JVD; normal carotid impulses w/o bruits; no thyromegaly or nodules palpated; no lymphadenopathy.  RESP  Clear  P & A; diminshed in bases  w/o, wheezes/ rales/ or rhonchi.no accessory muscle use, no dullness to percussion, left sided sutures site no significant redness, no drainage,  Sutures removed without difficulty, pt tolerated well.   CARD:  RRR, no m/r/g  , no peripheral edema, pulses intact, no cyanosis or clubbing.  GI:   Soft & nt; nml bowel sounds; no organomegaly or masses detected.  Musco: Warm bil, no deformities or joint swelling noted.   Neuro: alert, no focal deficits noted.    Skin: Warm, no lesions or rashes    CXR 04/05/11  Better aeration of the left lung base. Persistent pleural and  intimal opacity consistent with left pleural effusion and left  basilar atelectasis.      Assessment & Plan:

## 2011-04-15 ENCOUNTER — Other Ambulatory Visit: Payer: Self-pay | Admitting: Adult Health

## 2011-04-16 ENCOUNTER — Telehealth: Payer: Self-pay | Admitting: Internal Medicine

## 2011-04-16 NOTE — Telephone Encounter (Signed)
Pt requesting a refill on tramadol. Last fill was on 04-05-11 #45 tabs. Pt has f/u with [facility] on 04-24-11. Please advise if ok to refill? Carron Curie, CMA

## 2011-04-17 NOTE — Telephone Encounter (Signed)
That is fine for tramadol x 1 no refills Keep ov with Dr. Sherene Sires  Next week as planned  Please contact office for sooner follow up if symptoms do not improve or worsen or seek emergency care '

## 2011-04-17 NOTE — Telephone Encounter (Signed)
Dr. Anson Fret, pt's mother,  called to check on tramadol refill & wants a returned phone call asap at  [phone].  Antionette Fairy

## 2011-04-17 NOTE — Telephone Encounter (Signed)
Tammy, since MW out of the office and the pt was seen last by you, can you please advise if okay to refill tramadol? Pt had this filled last on 04-05-11 # 45 tablets. Thanks

## 2011-04-17 NOTE — Telephone Encounter (Signed)
Refill done per electronic refill request from pharmacy #[id number] with 0 refills as okayed by TP.  Left detailed message on mother's phone informing her that the requested prescription has been sent to the pharmacy and that pt needs to keep 3.13.13 appt with MW.  Algis Downs may call with any questions / concerns.  Will sign off.

## 2011-04-24 ENCOUNTER — Encounter: Payer: Self-pay | Admitting: Internal Medicine

## 2011-04-24 ENCOUNTER — Ambulatory Visit (INDEPENDENT_AMBULATORY_CARE_PROVIDER_SITE_OTHER): Payer: Self-pay | Admitting: Internal Medicine

## 2011-04-24 VITALS — BP 142/100 | HR 101 | Temp 98.3°F | Ht 64.0 in | Wt 148.0 lb

## 2011-04-24 DIAGNOSIS — I10 Essential (primary) hypertension: Secondary | ICD-10-CM

## 2011-04-24 DIAGNOSIS — J90 Pleural effusion, not elsewhere classified: Secondary | ICD-10-CM

## 2011-04-24 DIAGNOSIS — J942 Hemothorax: Secondary | ICD-10-CM

## 2011-04-24 MED ORDER — LISINOPRIL 20 MG PO TABS: 20.0000 mg | ORAL_TABLET | Freq: Every day | ORAL | Status: DC

## 2011-04-24 MED ORDER — ESCITALOPRAM OXALATE 10 MG PO TABS: 10.0000 mg | ORAL_TABLET | Freq: Every day | ORAL | Status: DC

## 2011-04-24 NOTE — Assessment & Plan Note (Signed)
Clnically resolved, no further cxr/s needed - f/u prn

## 2011-04-24 NOTE — Patient Instructions (Signed)
If you are satisfied with your treatment plan let your doctor know and he/she can either refill your medications or you can return here when your prescription runs out.     If in any way you are not 100% satisfied,  please tell us.  If 100% better, tell your friends!  

## 2011-04-24 NOTE — Progress Notes (Signed)
  Subjective:    Patient ID: Kayla Sandoval, female    DOB: Nov 02, 1970.   MRN: [id number]  Brief patient profile:  41 y/o female smoker seen for initial PCCM during hospitalization for traumatic left hemothorax 03/27/11.   04/05/11 Post Hospital follow up   Pt presents for a post hospital follow up . Pt was admitted 2/13 for severe dyspnea.  She fell approximately 3.5 weeks prior to admission and struck her left rib cage. She was found to have a large left effusion.  CT chest  showed large left effusion with mediastinal shift, collapse of left lung, cannot exclude underlying mass . A left chest tube was placed 2/14 with sucessful rexpansion and removed on 2/15 .Approx 3.5 liters of very bloody fluid removed over the 24 hr period . Serial CXR have showed reexpansion with residual LLL atx .   CX workup was neg. Pleural fluid showed inflammatory cells with no malignant cells noted.  Since discharge dyspnea is improved but she continues to have pain. Is using Tramadol with relief.  Did have some drainage at previous chest tube site yesterday. No fever or redness.  No hemoptysis, edema or cough.  CXR today shows improved aeration.  Encouraged on smoking cesstation rec Wash area with soap and water, pat dry and apply bandage until completely healed.  Call for redness, fever or drainage.  Warm heat to ribs.   04/24/2011 f/u ov/Rian Koon says still off cigarettes with slow improvement in all of her symptoms. No purulent sputum, hemoptysis, cp better with minimal motrin need, no limiting sob               Objective:   Physical Exam GEN: A/Ox3; pleasant , NAD, well nourished   HEENT:  [place]/AT,  EACs-clear, TMs-wnl, NOSE-clear, THROAT-clear, no lesions, no postnasal drip or exudate noted.   NECK:  Supple w/ fair ROM; no JVD; normal carotid impulses w/o bruits; no thyromegaly or nodules palpated; no lymphadenopathy.  RESP  Clear  P & A; diminshed in bases  w/o, wheezes/ rales/ or rhonchi.no accessory  muscle use, no dullness to percussion, left sided sutures site no significant redness, no drainage,  Sutures removed without difficulty, pt tolerated well.   CARD:  RRR, no m/r/g  , no peripheral edema, pulses intact, no cyanosis or clubbing.  GI:   Soft & nt; nml bowel sounds; no organomegaly or masses detected.  Musco: Warm bil, no deformities or joint swelling noted.   Neuro: alert, no focal deficits noted.    Skin: Warm, no lesions or rashes    CXR 04/05/11  Better aeration of the left lung base. Persistent pleural and  intimal opacity consistent with left pleural effusion and left  basilar atelectasis.      Assessment & Plan:

## 2015-04-03 ENCOUNTER — Inpatient Hospital Stay (HOSPITAL_COMMUNITY)
Admission: EM | Admit: 2015-04-03 | Discharge: 2015-04-07 | DRG: 917 | Disposition: A | Discharge status: Other Acute Inpatient Hospital | Payer: Self-pay | Attending: Internal Medicine | Admitting: Internal Medicine

## 2015-04-03 ENCOUNTER — Emergency Department (HOSPITAL_COMMUNITY): Payer: Self-pay

## 2015-04-03 ENCOUNTER — Encounter (HOSPITAL_COMMUNITY): Payer: Self-pay | Admitting: *Deleted

## 2015-04-03 DIAGNOSIS — F17210 Nicotine dependence, cigarettes, uncomplicated: Secondary | ICD-10-CM | POA: Diagnosis present

## 2015-04-03 DIAGNOSIS — G9340 Encephalopathy, unspecified: Secondary | ICD-10-CM

## 2015-04-03 DIAGNOSIS — F4311 Post-traumatic stress disorder, acute: Secondary | ICD-10-CM

## 2015-04-03 DIAGNOSIS — F10929 Alcohol use, unspecified with intoxication, unspecified: Secondary | ICD-10-CM

## 2015-04-03 DIAGNOSIS — F10239 Alcohol dependence with withdrawal, unspecified: Secondary | ICD-10-CM | POA: Diagnosis present

## 2015-04-03 DIAGNOSIS — T50901A Poisoning by unspecified drugs, medicaments and biological substances, accidental (unintentional), initial encounter: Secondary | ICD-10-CM

## 2015-04-03 DIAGNOSIS — F10939 Alcohol use, unspecified with withdrawal, unspecified: Secondary | ICD-10-CM

## 2015-04-03 DIAGNOSIS — F10120 Alcohol abuse with intoxication, uncomplicated: Secondary | ICD-10-CM

## 2015-04-03 DIAGNOSIS — Z634 Disappearance and death of family member: Secondary | ICD-10-CM

## 2015-04-03 DIAGNOSIS — F329 Major depressive disorder, single episode, unspecified: Secondary | ICD-10-CM | POA: Diagnosis present

## 2015-04-03 DIAGNOSIS — F17200 Nicotine dependence, unspecified, uncomplicated: Secondary | ICD-10-CM

## 2015-04-03 DIAGNOSIS — F10920 Alcohol use, unspecified with intoxication, uncomplicated: Secondary | ICD-10-CM

## 2015-04-03 DIAGNOSIS — T43212A Poisoning by selective serotonin and norepinephrine reuptake inhibitors, intentional self-harm, initial encounter: Principal | ICD-10-CM | POA: Diagnosis present

## 2015-04-03 DIAGNOSIS — R45851 Suicidal ideations: Secondary | ICD-10-CM | POA: Diagnosis present

## 2015-04-03 DIAGNOSIS — F430 Acute stress reaction: Secondary | ICD-10-CM | POA: Diagnosis present

## 2015-04-03 DIAGNOSIS — R0902 Hypoxemia: Secondary | ICD-10-CM

## 2015-04-03 DIAGNOSIS — J9601 Acute respiratory failure with hypoxia: Secondary | ICD-10-CM | POA: Diagnosis present

## 2015-04-03 DIAGNOSIS — I4581 Long QT syndrome: Secondary | ICD-10-CM | POA: Diagnosis present

## 2015-04-03 DIAGNOSIS — J189 Pneumonia, unspecified organism: Secondary | ICD-10-CM | POA: Diagnosis present

## 2015-04-03 DIAGNOSIS — T50902A Poisoning by unspecified drugs, medicaments and biological substances, intentional self-harm, initial encounter: Secondary | ICD-10-CM

## 2015-04-03 DIAGNOSIS — I10 Essential (primary) hypertension: Secondary | ICD-10-CM | POA: Diagnosis present

## 2015-04-03 DIAGNOSIS — G92 Toxic encephalopathy: Secondary | ICD-10-CM | POA: Diagnosis present

## 2015-04-03 HISTORY — DX: Nicotine dependence, unspecified, uncomplicated: F17.200

## 2015-04-03 HISTORY — DX: Alcohol use, unspecified with intoxication, unspecified: F10.929

## 2015-04-03 HISTORY — DX: Pneumonia, unspecified organism: J18.9

## 2015-04-03 HISTORY — DX: Acute respiratory failure with hypoxia: J96.01

## 2015-04-03 HISTORY — DX: Poisoning by unspecified drugs, medicaments and biological substances, accidental (unintentional), initial encounter: T50.901A

## 2015-04-03 HISTORY — DX: Encephalopathy, unspecified: G93.40

## 2015-04-03 LAB — URINALYSIS, ROUTINE W REFLEX MICROSCOPIC
BILIRUBIN URINE: NEGATIVE
GLUCOSE, UA: NEGATIVE mg/dL
HGB URINE DIPSTICK: NEGATIVE
KETONES UR: NEGATIVE mg/dL
Leukocytes, UA: NEGATIVE
Nitrite: NEGATIVE
PH: 5.5 (ref 5.0–8.0)
Protein, ur: NEGATIVE mg/dL
Specific Gravity, Urine: <1.005 — ABNORMAL LOW (ref 1.005–1.030)

## 2015-04-03 LAB — CBC WITH DIFFERENTIAL/PLATELET
BASOS ABS: 0 10*3/uL (ref 0.0–0.1)
BASOS PCT: 1 %
EOS ABS: 0 10*3/uL (ref 0.0–0.7)
EOS PCT: 1 %
HCT: 42.7 % (ref 36.0–46.0)
Hemoglobin: 15.5 g/dL — ABNORMAL HIGH (ref 12.0–15.0)
Lymphocytes Relative: 27 %
Lymphs Abs: 1.1 10*3/uL (ref 0.7–4.0)
MCH: 38.3 pg — ABNORMAL HIGH (ref 26.0–34.0)
MCHC: 36.3 g/dL — AB (ref 30.0–36.0)
MCV: 105.4 fL — ABNORMAL HIGH (ref 78.0–100.0)
MONO ABS: 0.5 10*3/uL (ref 0.1–1.0)
MONOS PCT: 12 %
Neutro Abs: 2.3 10*3/uL (ref 1.7–7.7)
Neutrophils Relative %: 59 %
PLATELETS: 81 10*3/uL — AB (ref 150–400)
RBC: 4.05 MIL/uL (ref 3.87–5.11)
RDW: 12.9 % (ref 11.5–15.5)
WBC: 3.9 10*3/uL — ABNORMAL LOW (ref 4.0–10.5)

## 2015-04-03 LAB — COMPREHENSIVE METABOLIC PANEL
ALBUMIN: 4.3 g/dL (ref 3.5–5.0)
ALK PHOS: 101 U/L (ref 38–126)
ALT: 51 U/L (ref 14–54)
ANION GAP: 15 (ref 5–15)
AST: 123 U/L — AB (ref 15–41)
BILIRUBIN TOTAL: 1 mg/dL (ref 0.3–1.2)
BUN: 6 mg/dL (ref 6–20)
CALCIUM: 9 mg/dL (ref 8.9–10.3)
CO2: 24 mmol/L (ref 22–32)
CREATININE: 0.37 mg/dL — AB (ref 0.44–1.00)
Chloride: 106 mmol/L (ref 101–111)
GFR calc Af Amer: >60 mL/min (ref >60)
GFR calc non Af Amer: >60 mL/min (ref >60)
GLUCOSE: 98 mg/dL (ref 65–99)
Potassium: 3.4 mmol/L — ABNORMAL LOW (ref 3.5–5.1)
Sodium: 145 mmol/L (ref 135–145)
TOTAL PROTEIN: 7.4 g/dL (ref 6.5–8.1)

## 2015-04-03 LAB — RAPID URINE DRUG SCREEN, HOSP PERFORMED
Amphetamines: NOT DETECTED
BENZODIAZEPINES: NOT DETECTED
Barbiturates: NOT DETECTED
COCAINE: NOT DETECTED
Opiates: NOT DETECTED
Tetrahydrocannabinol: POSITIVE — AB

## 2015-04-03 LAB — PREGNANCY, URINE: Preg Test, Ur: NEGATIVE

## 2015-04-03 LAB — BLOOD GAS, ARTERIAL
Acid-base deficit: 3 mmol/L — ABNORMAL HIGH (ref 0.0–2.0)
BICARBONATE: 21.9 meq/L (ref 20.0–24.0)
Drawn by: 105551
FIO2: 100
O2 SAT: 98.4 %
PO2 ART: 145 mmHg — AB (ref 80.0–100.0)
pCO2 arterial: 39.6 mmHg (ref 35.0–45.0)
pH, Arterial: 7.357 (ref 7.350–7.450)

## 2015-04-03 LAB — ETHANOL: Alcohol, Ethyl (B): 388 mg/dL (ref <5)

## 2015-04-03 LAB — CBG MONITORING, ED: Glucose-Capillary: 95 mg/dL (ref 65–99)

## 2015-04-03 LAB — CARBOXYHEMOGLOBIN
Carboxyhemoglobin: 4.5 % — ABNORMAL HIGH (ref 0.5–1.5)
METHEMOGLOBIN: 1.1 % (ref 0.0–1.5)
O2 Saturation: 98.4 %
TOTAL OXYGEN CONTENT: 17.2 mL/dL (ref 15.0–23.0)
Total hemoglobin: 13 g/dL (ref 12.0–16.0)

## 2015-04-03 LAB — INFLUENZA PANEL BY PCR (TYPE A & B)
H1N1FLUPCR: NOT DETECTED
INFLBPCR: NEGATIVE
Influenza A By PCR: NEGATIVE

## 2015-04-03 LAB — ACETAMINOPHEN LEVEL: Acetaminophen (Tylenol), Serum: <10 ug/mL — ABNORMAL LOW (ref 10–30)

## 2015-04-03 LAB — SALICYLATE LEVEL: Salicylate Lvl: <4 mg/dL (ref 2.8–30.0)

## 2015-04-03 LAB — MAGNESIUM: Magnesium: 1.9 mg/dL (ref 1.7–2.4)

## 2015-04-03 MED ORDER — DEXTROSE 5 % IV SOLN
1.0000 g | Freq: Once | INTRAVENOUS | Status: AC
  Administered 2015-04-03: 1 g via INTRAVENOUS
  Filled 2015-04-03: qty 10

## 2015-04-03 MED ORDER — SODIUM CHLORIDE 0.9 % IV SOLN
INTRAVENOUS | Status: DC
  Administered 2015-04-03: 08:00:00 via INTRAVENOUS

## 2015-04-03 MED ORDER — POTASSIUM CHLORIDE 10 MEQ/100ML IV SOLN
10.0000 meq | INTRAVENOUS | Status: AC
  Administered 2015-04-03 (×2): 10 meq via INTRAVENOUS
  Filled 2015-04-03 (×2): qty 100

## 2015-04-03 MED ORDER — THIAMINE HCL 100 MG/ML IJ SOLN
100.0000 mg | Freq: Every day | INTRAMUSCULAR | Status: DC
  Administered 2015-04-06: 100 mg via INTRAVENOUS
  Filled 2015-04-03: qty 2

## 2015-04-03 MED ORDER — LORAZEPAM 2 MG/ML IJ SOLN
1.0000 mg | Freq: Four times a day (QID) | INTRAMUSCULAR | Status: AC | PRN
  Administered 2015-04-03 – 2015-04-06 (×7): 1 mg via INTRAVENOUS
  Filled 2015-04-03 (×8): qty 1

## 2015-04-03 MED ORDER — MAGNESIUM OXIDE 400 (241.3 MG) MG PO TABS
400.0000 mg | ORAL_TABLET | Freq: Once | ORAL | Status: AC
  Administered 2015-04-03: 400 mg via ORAL
  Filled 2015-04-03: qty 1

## 2015-04-03 MED ORDER — DEXTROSE 5 % IV SOLN
500.0000 mg | INTRAVENOUS | Status: DC
  Administered 2015-04-04 – 2015-04-05 (×2): 500 mg via INTRAVENOUS
  Filled 2015-04-03 (×3): qty 500

## 2015-04-03 MED ORDER — ACETAMINOPHEN 325 MG PO TABS
650.0000 mg | ORAL_TABLET | Freq: Four times a day (QID) | ORAL | Status: DC | PRN
  Administered 2015-04-03 – 2015-04-05 (×3): 650 mg via ORAL
  Filled 2015-04-03 (×3): qty 2

## 2015-04-03 MED ORDER — LORAZEPAM 1 MG PO TABS
1.0000 mg | ORAL_TABLET | Freq: Four times a day (QID) | ORAL | Status: AC | PRN
  Administered 2015-04-04 – 2015-04-05 (×3): 1 mg via ORAL
  Filled 2015-04-03 (×4): qty 1

## 2015-04-03 MED ORDER — SODIUM CHLORIDE 0.9% FLUSH: 3.0000 mL | INTRAVENOUS | Status: DC | PRN

## 2015-04-03 MED ORDER — POTASSIUM CHLORIDE CRYS ER 20 MEQ PO TBCR
40.0000 meq | EXTENDED_RELEASE_TABLET | Freq: Once | ORAL | Status: AC
  Administered 2015-04-03: 40 meq via ORAL
  Filled 2015-04-03: qty 2

## 2015-04-03 MED ORDER — SODIUM CHLORIDE 0.9 % IV BOLUS (SEPSIS)
1000.0000 mL | Freq: Once | INTRAVENOUS | Status: AC
  Administered 2015-04-03: 1000 mL via INTRAVENOUS

## 2015-04-03 MED ORDER — SODIUM CHLORIDE 0.9 % IJ SOLN
INTRAMUSCULAR | Status: AC
  Filled 2015-04-03: qty 500

## 2015-04-03 MED ORDER — SODIUM CHLORIDE 0.9% FLUSH
INTRAVENOUS | Status: AC
  Filled 2015-04-03: qty 150

## 2015-04-03 MED ORDER — METHYLPREDNISOLONE SODIUM SUCC 125 MG IJ SOLR
125.0000 mg | Freq: Once | INTRAMUSCULAR | Status: AC
  Administered 2015-04-03: 125 mg via INTRAVENOUS
  Filled 2015-04-03: qty 2

## 2015-04-03 MED ORDER — ALBUTEROL (5 MG/ML) CONTINUOUS INHALATION SOLN
10.0000 mg/h | INHALATION_SOLUTION | Freq: Once | RESPIRATORY_TRACT | Status: DC
  Filled 2015-04-03: qty 20

## 2015-04-03 MED ORDER — SODIUM CHLORIDE 0.9 % IV SOLN
INTRAVENOUS | Status: DC
  Administered 2015-04-03: 04:00:00 via INTRAVENOUS

## 2015-04-03 MED ORDER — SODIUM CHLORIDE 0.9% FLUSH
3.0000 mL | Freq: Two times a day (BID) | INTRAVENOUS | Status: DC
  Administered 2015-04-03 – 2015-04-06 (×6): 3 mL via INTRAVENOUS

## 2015-04-03 MED ORDER — IOHEXOL 350 MG/ML SOLN
100.0000 mL | Freq: Once | INTRAVENOUS | Status: AC | PRN
  Administered 2015-04-03: 100 mL via INTRAVENOUS

## 2015-04-03 MED ORDER — MAGNESIUM OXIDE 400 (241.3 MG) MG PO TABS
ORAL_TABLET | ORAL | Status: AC
  Filled 2015-04-03: qty 1

## 2015-04-03 MED ORDER — DEXTROSE 5 % IV SOLN
500.0000 mg | Freq: Once | INTRAVENOUS | Status: AC
  Administered 2015-04-03: 500 mg via INTRAVENOUS
  Filled 2015-04-03: qty 500

## 2015-04-03 MED ORDER — PREDNISONE 50 MG PO TABS: 60.0000 mg | ORAL_TABLET | Freq: Every day | ORAL | Status: DC

## 2015-04-03 MED ORDER — PIPERACILLIN-TAZOBACTAM 3.375 G IVPB
3.3750 g | Freq: Three times a day (TID) | INTRAVENOUS | Status: DC
  Administered 2015-04-03 – 2015-04-05 (×7): 3.375 g via INTRAVENOUS
  Filled 2015-04-03 (×7): qty 50

## 2015-04-03 MED ORDER — IPRATROPIUM-ALBUTEROL 0.5-2.5 (3) MG/3ML IN SOLN
3.0000 mL | Freq: Four times a day (QID) | RESPIRATORY_TRACT | Status: DC
  Administered 2015-04-03: 3 mL via RESPIRATORY_TRACT
  Filled 2015-04-03: qty 3

## 2015-04-03 MED ORDER — IPRATROPIUM-ALBUTEROL 0.5-2.5 (3) MG/3ML IN SOLN
3.0000 mL | RESPIRATORY_TRACT | Status: DC
  Administered 2015-04-03 (×2): 3 mL via RESPIRATORY_TRACT
  Filled 2015-04-03 (×2): qty 3

## 2015-04-03 MED ORDER — FOLIC ACID 1 MG PO TABS
1.0000 mg | ORAL_TABLET | Freq: Every day | ORAL | Status: DC
  Administered 2015-04-03 – 2015-04-07 (×5): 1 mg via ORAL
  Filled 2015-04-03 (×5): qty 1

## 2015-04-03 MED ORDER — VITAMIN B-1 100 MG PO TABS
100.0000 mg | ORAL_TABLET | Freq: Every day | ORAL | Status: DC
  Administered 2015-04-03 – 2015-04-07 (×4): 100 mg via ORAL
  Filled 2015-04-03 (×4): qty 1

## 2015-04-03 MED ORDER — IPRATROPIUM-ALBUTEROL 0.5-2.5 (3) MG/3ML IN SOLN
3.0000 mL | Freq: Four times a day (QID) | RESPIRATORY_TRACT | Status: DC
Start: 2015-04-04 — End: 2015-04-05
  Administered 2015-04-04 – 2015-04-05 (×5): 3 mL via RESPIRATORY_TRACT
  Filled 2015-04-03 (×4): qty 3

## 2015-04-03 MED ORDER — ADULT MULTIVITAMIN W/MINERALS CH
1.0000 | ORAL_TABLET | Freq: Every day | ORAL | Status: DC
  Administered 2015-04-03 – 2015-04-07 (×5): 1 via ORAL
  Filled 2015-04-03 (×5): qty 1

## 2015-04-03 MED ORDER — ACETAMINOPHEN 650 MG RE SUPP: 650.0000 mg | Freq: Four times a day (QID) | RECTAL | Status: DC | PRN

## 2015-04-03 MED ORDER — NICOTINE 14 MG/24HR TD PT24
14.0000 mg | MEDICATED_PATCH | Freq: Every day | TRANSDERMAL | Status: DC
  Administered 2015-04-04 – 2015-04-07 (×4): 14 mg via TRANSDERMAL
  Filled 2015-04-03 (×4): qty 1

## 2015-04-03 MED ORDER — SODIUM CHLORIDE 0.9 % IV SOLN
250.0000 mL | INTRAVENOUS | Status: DC | PRN
  Administered 2015-04-05: 250 mL via INTRAVENOUS

## 2015-04-03 MED ORDER — ALBUTEROL SULFATE (2.5 MG/3ML) 0.083% IN NEBU
2.5000 mg | INHALATION_SOLUTION | RESPIRATORY_TRACT | Status: DC | PRN
  Administered 2015-04-06: 2.5 mg via RESPIRATORY_TRACT
  Filled 2015-04-03: qty 3

## 2015-04-03 NOTE — ED Notes (Signed)
Pt up and walked around nurses' station with assistance; pt's O2 sats dropped to the lower 80's; Dr. Leonides Schanz notified

## 2015-04-03 NOTE — ED Notes (Signed)
Pt gone over to CT 

## 2015-04-03 NOTE — ED Notes (Signed)
Dr. Sarajane Jews at beside.

## 2015-04-03 NOTE — Progress Notes (Signed)
Pharmacy Antibiotic Note  Kayla Sandoval is a 45 y.o. female admitted on 04/03/2015 with aspiration pneumonia.  Pharmacy has been consulted for zosyn dosing. Pt found yesterday unresponsive, attempted overdose with trazodone. Further evaluation revealed bilateral bronchopneumonia with acute hypoxic respiratory failure.  Plan: Zosyn 3.375g IV q8h (4 hour infusion).  Height: 5\' 5"  (165.1 cm) Weight: 160 lb (72.576 kg) IBW/kg (Calculated) : 57  Temp (24hrs), Avg:97 F (36.1 C), Min:93.9 F (34.4 C), Max:98.2 F (36.8 C)   Recent Labs Lab 04/03/15 0125  WBC 3.9*  CREATININE 0.37*    Estimated Creatinine Clearance: 89.5 mL/min (by C-G formula based on Cr of 0.37).    No Known Allergies  Antimicrobials this admission: Zosyn 2/20 >>  Dose adjustments this admission: none  Microbiology results: 2/20 BCx: pending  Thank you for allowing pharmacy to be a part of this patient's care. Isac Sarna, BS [place], [place] Clinical Pharmacist Pager [phone] 04/03/2015 12:53 PM

## 2015-04-03 NOTE — ED Notes (Signed)
Spoke with poison control to give an update

## 2015-04-03 NOTE — ED Notes (Signed)
Pt speaking with her mom on the phone at this time.

## 2015-04-03 NOTE — ED Provider Notes (Signed)
TIME SEEN: 1:40 AM  CHIEF COMPLAINT: Overdose  HPI: Pt is a 45 y.o. female with history of hypertension and depression who presents to the emergency department with an overdose. Patient was found by her daughter unresponsive. Concern for possible alcohol and trazodone ingestion. Given 2 mg of Narcan en route with EMS without improvement. In the ED patient is hypothermic but otherwise hemodynamically stable. Hypothermia likely from intoxication and being found down for an unknown period of time. Blood glucoses 95. Oxygen saturation will drop to the upper 80s. She's been placed on a nonrebreather.  ROS: Level V caveat for altered metal status  PAST MEDICAL HISTORY/PAST SURGICAL HISTORY:  Past Medical History  Diagnosis Date  . Hypertension   . Depression     MEDICATIONS:  Prior to Admission medications   Medication Sig Start Date End Date Taking? Authorizing Provider  escitalopram (LEXAPRO) 10 MG tablet Take 1 tablet (10 mg total) by mouth daily. 04/24/11   Tanda Rockers, MD  ibuprofen (ADVIL,MOTRIN) 200 MG tablet Take 600 mg by mouth every 6 (six) hours as needed.    Historical Provider, MD  lisinopril (PRINIVIL,ZESTRIL) 20 MG tablet Take 1 tablet (20 mg total) by mouth daily. 04/24/11   Tanda Rockers, MD  traMADol (ULTRAM) 50 MG tablet TAKE (1) TABLET BY MOUTH EVERY (6) HOURS AS NEEDED FOR PAIN 04/15/11   Melvenia Needles, NP    ALLERGIES:  No Known Allergies  SOCIAL HISTORY:  Social History  Substance Use Topics  . Smoking status: Former Smoker -- 0.50 packs/day for 10 years    Types: Cigarettes    Quit date: 03/15/2011  . Smokeless tobacco: Not on file  . Alcohol Use: Yes    FAMILY HISTORY: No family history on file.  EXAM: BP 154/94 mmHg  Pulse 86  Temp(Src) 96.2 F (35.7 C) (Rectal)  Resp 16  Ht 5\' 5"  (1.651 m)  Wt 160 lb (72.576 kg)  BMI 26.63 kg/m2  SpO2 100% CONSTITUTIONAL: Patient is very drowsy but will respond with sternal rub. She has very slurred speech is  difficult to understand. She can tell us her name. Falls back asleep quickly. HEAD: Normocephalic, patient's face appears very red but no sign of trauma EYES: Conjunctivae injected bilaterally, PERRL ENT: normal nose; no rhinorrhea; dry mucous membranes; pharynx without lesions noted NECK: Supple, no meningismus, no LAD  CARD: RRR; S1 and S2 appreciated; no murmurs, no clicks, no rubs, no gallops RESP: Normal chest excursion without splinting or tachypnea; breath sounds equal bilaterally; diffuse expiratory wheezes with diminished aeration at bases, no rhonchi, no rales, no hypoxia or respiratory distress, speaking full sentences ABD/GI: Normal bowel sounds; non-distended; soft, non-tender, no rebound, no guarding, no peritoneal signs BACK:  The back appears normal other than a small area of ecchymosis to the left thoracic area and is non-tender to palpation, there is no CVA tenderness, no midline spinal tenderness or step-off or deformity EXT: Normal ROM in all joints; non-tender to palpation; no edema; normal capillary refill; no cyanosis, no calf tenderness or swelling; no bony tenderness or bony deformity SKIN: Normal color for age and race; warm, no rashes or lacerations or ecchymosis NEURO: Moves all extremities equally, there is slurred speech, drowsy but arousable, able to tell us her name; no clonus PSYCH: Appears disheveled. Denies SI or HI.  MEDICAL DECISION MAKING: Patient here with overdose. Patient's husband died earlier today. Concern for possible intentional overdose. Family coming to the emergency room. She is hypothermic but otherwise clinically  stable. Patient is on a nonrebreather, has a Foley catheter placed and is being warmed with a Retail banker. Will obtain labs, urine, CT of her head and cervical spine and chest x-ray. We'll discuss with poison control.  ED PROGRESS:     3:00 AM  Pt's labs show a alcohol level of 388. Urine drug screen positive for marijuana. Tylenol and  salicylate level negative. ABG is reassuring. Carboxyhemoglobin elevated slightly at 4.5 which is likely from patient being a smoker. She is still on a nonrebreather for this. She is now more awake and awake spontaneously and is more easy to understand. Her daughter at bedside states that she does think that this was an intentional overdose. Patient denies a previous history of suicide attempt. Daughter reports that around midnight patient took approximately 30 trazodone tablets of unknown dose. Patient denies taking any trazodone. Patient's daughter reports that this is her grandmother's prescription. Patient does admit to alcohol use.   Discussed with Patty with poison control. She states that trazodone can prolong patient's QTC. Her QTC currently is 511 ms which is slightly longer than her previous EKG. She recommends optimizing patient's potassium and magnesium levels. Potassium is 3.4. We'll give 20 mEq IV, 40 mEq by mouth and obtain a magnesium level as well.    They recommend monitoring patient for proximal 6 hours after ingestion. They recommend repeating an EKG at that time to see if there is any change in her QT interval. If patient is more awake, still hemodynamically stable with no significant EKG changes and she can be medically cleared. She will need psychiatric evaluation at that time because daughter's concern that this was an intentional overdose triggered by recent death of patient's husband.   Patient's head and cervical spine CT are unremarkable as well as her chest x-ray.    5:10 AM  On reevaluation patient is now awake and sitting upright. Speech is clear and normal. She is tachycardic but has been tachycardic since she received albuterol treatment. Her lungs are now clear to auscultation. We have taken her off a nonrebreather but she does de-sat to 85%. She states she doesn't have any chest pain, chest discomfort or shortness of breath but states "I smoke like a chimney". States she  does not wear oxygen at home. We'll give duo nebs and IV Solu-Medrol.  ABG was reassuring.  CXR clear.  No history of PE or DVT and doubt that she has a PE currently given she is not having any symptoms. We'll continue to monitor patient. Will repeat EKG at 6 AM, 6 hours after history of ingestion.     6:30 AM  Pt has received a duo neb. Her lungs are still clear with good aeration and she has no wheezing right now. She reports she is not short of breath but when she gets up from bed with oxygen off she is in the mid 80s. After ambulating her sats dropped to 80% on room air. Patient placed back on nasal cannula and her oxygen improves. Will obtain a CT of her chest for further evaluation to make sure there is no PE or pneumonia not seen on x-ray. She does state she has had a "head cold" with runny nose, productive cough. No fever.   She does now admit to taking approximately 10 trazodone tablets at some point last night. She denies taking anything else other than alcohol. She reports she is no longer on Lexapro, lisinopril or tramadol. She states she knows this could have  killed her but she didn't care because of her husband's recent death. She states she doesn't feel like she needs to talk to a psychiatrist but agrees to stay. I will fill out IVC paperwork just in case if patient decides she is going to leave before psychiatric disposition.     Patient's repeat EKG shows a QTC of 478 ms.  Patient's temperature has improved to 36.7. Will remove Bair hugger. She is now awake, drinking and ambulatory. Oriented 3 and neurologically intact. Will remove Foley catheter.      7:15 AM  Pt's CT scan shows no pulmonary emboli but patient does have bilateral bronchopneumonia without lobar collapse. Will treat with ceftriaxone and azithromycin. We'll obtain blood cultures. Will admit to hospitalist for pneumonia and hypoxia. Once medically stable patient will need psychiatric evaluation for intentional overdose,  suicidal ideation.  She agrees with medical admission but appears very reluctant. Because of this I will complete her IVC paperwork. I do not feel she is safe to leave given she had a serious attempt to end her life last night. She does not currently have a primary care provider. She does have a sitter at bedside.    7:40 AM  D/w Dr. Sarajane Jews with hospitalist service who agrees on admission. I feel patient is safe to be admitted to a medical bed, inpatient. Patient does have a mild leukopenia but was not tachycardic or hypotensive initially. I still feel her hypothermia was secondary to being found down rather than sepsis. We are treating her for community-acquired pneumonia and she is doing well on nasal cannula. She has received 2 L of IV fluid bolus and has a continuous rate at this time. I will place holding orders per hospitalist request. Care transferred to hospitalist service. Hospitalist is aware patient is being involuntarily committed and will need psychiatric evaluation once medically stable.    EKG Interpretation  Date/Time:  Monday April 03 2015 01:26:38 EST Ventricular Rate:  87 PR Interval:  151 QRS Duration: 98 QT Interval:  425 QTC Calculation: 511 R Axis:   32 Text Interpretation:  Sinus rhythm Prolonged QT interval No significant change since last tracing other than QTC longer Confirmed by WARD,  DO, KRISTEN [phone]) on 04/03/2015 1:48:26 AM        EKG Interpretation  Date/Time:  Monday April 03 2015 06:20:50 EST Ventricular Rate:  106 PR Interval:  139 QRS Duration: 85 QT Interval:  360 QTC Calculation: 478 R Axis:   57 Text Interpretation:  Sinus tachycardia Low voltage, precordial leads QTc prolongation resolved compared to prior EKG Confirmed by WARD,  DO, KRISTEN [id number]) on 04/03/2015 6:32:01 AM       CRITICAL CARE Performed by: Nyra Jabs   Total critical care time: 60 minutes  Critical care time was exclusive of separately billable procedures  and treating other patients.  Critical care was necessary to treat or prevent imminent or life-threatening deterioration.  Critical care was time spent personally by me on the following activities: development of treatment plan with patient and/or surrogate as well as nursing, discussions with consultants, evaluation of patient's response to treatment, examination of patient, obtaining history from patient or surrogate, ordering and performing treatments and interventions, ordering and review of laboratory studies, ordering and review of radiographic studies, pulse oximetry and re-evaluation of patient's condition.    [place], DO 04/03/15 [phone]

## 2015-04-03 NOTE — H&P (Signed)
History and Physical  Kayla Sandoval [id number] DOB: 1970-07-03 DOA: 04/03/2015  Referring physician: Pryor Curia DO PCP: Pcp Not In System   Chief Complaint: Overdose   HPI:  45 year old woman with history of depression whose husband unexpectedly died Apr 09, 2022, presented to the emergency department after being found down unresponsive by her daughter. Initial history notable for trazodone overdose, complicated by alcohol intoxication. Found to be hypoxic. Further evaluation revealed bilateral bronchopneumonia with acute hypoxic respiratory failure. Initially had a prolonged QT secondary to trazodone overdose but this subsequently resolved. In regard to her overdose, she is medically clear but will be admitted for pneumonia and hypoxia.   Per patient Sunday morning she found her found deceased. She admits she typically takes 3 Trazodones but on this morning took 10. Although she denies SI she does admit just wanting to "go away." She states she has a strong family support. She also drank a lot of alcohol to make the pain go away. Currently denies suicidal ideation.  In the emergency department: temp 96.3>>98.1, VSS except hypoxia on RA. Pertinent labs: alcohol 388, plt 81, carboxyhemoglobin 4.5, ABG 7.357/39/145. AST 123. CMP otherwise unremarkable. Pregnancy negative. Tylenol and salicylate negative. UDS positive for THC EKG: Independently reviewed. 0126: SR prolonged QT. 0620: ST, normal QT, no acute changes. Imaging: independently reviewed. CT chest bilateral bronchopenumonia, no PE. CT head and neck negative. CXR independently reviewed: NAD.   Review of Systems:  Positive for SOB, sore throat, nausea Negative for fever, visual changes, rash, new muscle aches, chest pain, dysuria, bleeding, v/abdominal pain.  Past Medical History  Diagnosis Date  . Hypertension   . Depression     History reviewed. No pertinent past surgical history.  Social History:  reports that she quit smoking  about 4 years ago. Her smoking use included Cigarettes. She has a 5 pack-year smoking history. She does not have any smokeless tobacco history on file. She reports that she drinks alcohol. She reports that she does not use illicit drugs. lives alone Self-care  No Known Allergies  History reviewed. No pertinent family history.   Prior to Admission medications   Not on File   Physical Exam: Filed Vitals:   04/03/15 0558 04/03/15 0600 04/03/15 0630 04/03/15 0714  BP:  113/80 108/77 148/76  Pulse:  104 100 111  Temp:  97.7 F (36.5 C) 98.1 F (36.7 C)   TempSrc:      Resp:  17 16 18   Height:      Weight:      SpO2: 94% 95% 94% 92%    General:  Appears calm and comfortable Eyes: PERRL, normal lids, irises   ENT: grossly normal hearing, lips & tongue Neck: Appears normal Cardiovascular: Tachycardiac, otherwise normal. No m/r/g. No LE edema. Respiratory: Decreased breath sounds. No frank w/r/r. Normal respiratory effort. Speaks in full sentences.  Abdomen: soft, non-tender, not distended  Skin: Bruise over left scapula. Otherwise unremarkable.  Musculoskeletal: grossly normal tone BUE/BLE Psychiatric: grossly normal mood and affect, speech fluent and appropriate Neurologic: grossly non-focal.  Wt Readings from Last 3 Encounters:  04/03/15 72.576 kg (160 lb)  04/24/11 67.132 kg (148 lb)  04/05/11 64.683 kg (142 lb 9.6 oz)    Labs on Admission:  Basic Metabolic Panel:  Recent Labs Lab 04/03/15 0125  NA 145  K 3.4*  CL 106  CO2 24  GLUCOSE 98  BUN 6  CREATININE 0.37*  CALCIUM 9.0  MG 1.9    Liver Function Tests:  Recent Labs Lab  04/03/15 0125  AST 123*  ALT 51  ALKPHOS 101  BILITOT 1.0  PROT 7.4  ALBUMIN 4.3   CBC:  Recent Labs Lab 04/03/15 0125  WBC 3.9*  NEUTROABS 2.3  HGB 15.5*  HCT 42.7  MCV 105.4*  PLT 81*   CBG:  Recent Labs Lab 04/03/15 0148  GLUCAP 95     Radiological Exams on Admission: Ct Head Wo Contrast  04/03/2015   CLINICAL DATA:  Found down on floor, unresponsive. Drug overdose. Initial encounter. EXAM: CT HEAD WITHOUT CONTRAST CT CERVICAL SPINE WITHOUT CONTRAST TECHNIQUE: Multidetector CT imaging of the head and cervical spine was performed following the standard protocol without intravenous contrast. Multiplanar CT image reconstructions of the cervical spine were also generated. COMPARISON:  None. FINDINGS: CT HEAD FINDINGS There is no evidence of acute infarction, mass lesion, or intra- or extra-axial hemorrhage on CT. The posterior fossa, including the cerebellum, brainstem and fourth ventricle, is within normal limits. The third and lateral ventricles, and basal ganglia are unremarkable in appearance. The cerebral hemispheres are symmetric in appearance, with normal gray-white differentiation. No mass effect or midline shift is seen. There is no evidence of fracture; visualized osseous structures are unremarkable in appearance. The visualized portions of the orbits are within normal limits. Mucosal thickening is noted at the right maxillary sinus. The remaining paranasal sinuses and mastoid air cells are well-aerated. No significant soft tissue abnormalities are seen. CT CERVICAL SPINE FINDINGS There is no evidence of fracture or subluxation. Vertebral bodies demonstrate normal height and alignment. Intervertebral disc spaces are preserved. Prevertebral soft tissues are within normal limits. The visualized neural foramina are grossly unremarkable. The thyroid gland is unremarkable in appearance. The visualized lung apices are clear. No significant soft tissue abnormalities are seen. IMPRESSION: 1. No evidence of traumatic intracranial injury or fracture. 2. No evidence of fracture or subluxation along the cervical spine. 3. Mucosal thickening at the right maxillary sinus. Electronically Signed   By: Garald Balding M.D.   On: 04/03/2015 02:55   Ct Angio Chest Pe W/cm &/or Wo Cm  04/03/2015  CLINICAL DATA:  Hypoxia.  Shortness of breath. Mid sternal chest pain. EXAM: CT ANGIOGRAPHY CHEST WITH CONTRAST TECHNIQUE: Multidetector CT imaging of the chest was performed using the standard protocol during bolus administration of intravenous contrast. Multiplanar CT image reconstructions and MIPs were obtained to evaluate the vascular anatomy. CONTRAST:  158mL OMNIPAQUE IOHEXOL 350 MG/ML SOLN COMPARISON:  Radiography same day.  CT 03/29/2011. FINDINGS: Pulmonary arterial opacification is good. There are no pulmonary emboli. No evidence of a aortic pathology. No visible coronary artery calcification. No pleural or pericardial fluid. There is patchy airspace filling affecting the right upper lobe posteriorly and both lower lobes. This is consistent with bronchopneumonia. Largest area of consolidation is at the right base. No lobar collapse. Scans in the upper abdomen are unremarkable. Review of the MIP images confirms the above findings. IMPRESSION: No pulmonary emboli. Bilateral bronchopneumonia without lobar collapse. Electronically Signed   By: Nelson Chimes M.D.   On: 04/03/2015 07:14   Ct Cervical Spine Wo Contrast  04/03/2015  CLINICAL DATA:  Found down on floor, unresponsive. Drug overdose. Initial encounter. EXAM: CT HEAD WITHOUT CONTRAST CT CERVICAL SPINE WITHOUT CONTRAST TECHNIQUE: Multidetector CT imaging of the head and cervical spine was performed following the standard protocol without intravenous contrast. Multiplanar CT image reconstructions of the cervical spine were also generated. COMPARISON:  None. FINDINGS: CT HEAD FINDINGS There is no evidence of acute infarction, mass lesion,  or intra- or extra-axial hemorrhage on CT. The posterior fossa, including the cerebellum, brainstem and fourth ventricle, is within normal limits. The third and lateral ventricles, and basal ganglia are unremarkable in appearance. The cerebral hemispheres are symmetric in appearance, with normal gray-white differentiation. No mass effect or  midline shift is seen. There is no evidence of fracture; visualized osseous structures are unremarkable in appearance. The visualized portions of the orbits are within normal limits. Mucosal thickening is noted at the right maxillary sinus. The remaining paranasal sinuses and mastoid air cells are well-aerated. No significant soft tissue abnormalities are seen. CT CERVICAL SPINE FINDINGS There is no evidence of fracture or subluxation. Vertebral bodies demonstrate normal height and alignment. Intervertebral disc spaces are preserved. Prevertebral soft tissues are within normal limits. The visualized neural foramina are grossly unremarkable. The thyroid gland is unremarkable in appearance. The visualized lung apices are clear. No significant soft tissue abnormalities are seen. IMPRESSION: 1. No evidence of traumatic intracranial injury or fracture. 2. No evidence of fracture or subluxation along the cervical spine. 3. Mucosal thickening at the right maxillary sinus. Electronically Signed   By: Garald Balding M.D.   On: 04/03/2015 02:55   Dg Chest Port 1 View  04/03/2015  CLINICAL DATA:  Wheezing. EXAM: PORTABLE CHEST 1 VIEW COMPARISON:  04/05/2011 FINDINGS: The cardiomediastinal contours are normal. Resolution of pleural parenchymal opacity at the left lung base. Pulmonary vasculature is normal. No consolidation, pleural effusion, or pneumothorax. No acute osseous abnormalities are seen. Bowel right rib fractures are seen. IMPRESSION: No acute pulmonary process. Electronically Signed   By: Jeb Levering M.D.   On: 04/03/2015 02:37      Principal Problem:   CAP (community acquired pneumonia) Active Problems:   Overdose   Acute encephalopathy   Acute respiratory failure with hypoxia (HCC)   Alcohol intoxication ([place])   Tobacco use disorder   Assessment/Plan 1. Intentional overdose with trazodone [id number] tabs complicated by alcohol intoxication. Initial prolonged QT has resolved. Patient's husband  died unexpectedly Apr 25, 2022.  2. Acute encephalopathy secondary to intentional overdose and alcohol, resolved.  3. Bilateral bronchopneumonia with hypoxia. Consider aspiration although she has been sick for a week to 2 weeks. 4. Acute hypoxic respiratory failure secondary to above. 5. Alcohol intoxication. UDS positive for marijuana. 6. Prolonged QT, resolved. 7. Depression. 8. Tobacco use disorder.   Appears stable, plan admission to medical bed.  Sitter at bedside, psych consult when medically clear, IVC placed in ED  Abx directed at aspiration but will add atypical coverage given 1-2 week history of illness  Wean oxygen as tolerated  Monitor for alcohol withdrawal  Smoking cessation counseling   Code Status: Full  DVT prophylaxis:SCDs Family Communication: No family bedside. Discussed with patient who understands and has no concerns at this time. Disposition Plan/Anticipated LOS: Admit to medical bed. Anticipate discharge in 1-2 days.   Time spent: 55 minutes  Murray Hodgkins, MD  [facility] Pager [phone] 04/03/2015, 7:29 AM   By signing my name below, I, Rennis Harding attest that this documentation has been prepared under the direction and in the presence of Murray Hodgkins, MD Electronically signed: Rennis Harding 04/03/2015 8:28am   I personally performed the services described in this documentation. All medical record entries made by the scribe were at my direction. I have reviewed the chart and agree that the record reflects my personal performance and is accurate and complete. Murray Hodgkins, MD

## 2015-04-03 NOTE — ED Notes (Signed)
[facility] here to serve pt IVC paperwork.

## 2015-04-03 NOTE — ED Notes (Signed)
Phlebotomy at bedside.

## 2015-04-03 NOTE — ED Notes (Signed)
TTS machine placed at bedside.   

## 2015-04-03 NOTE — ED Notes (Signed)
Received verbal permission from patient to disclose information to her mother.

## 2015-04-03 NOTE — ED Notes (Signed)
Pt is currently in CT and should be back in room in approx 15 mins. Writer will attempt teleassessment at that time.  Arnold Long, [place] [facility]rapeutic Triage Specialist

## 2015-04-03 NOTE — ED Notes (Signed)
Pt vomited 1x.

## 2015-04-03 NOTE — BH Assessment (Addendum)
Tele Assessment Note   Kayla Sandoval is an 45 y.o. female. Pt presents to [facility]. Per chart review, pt intentionally ingested an unknown quantity of Trazodone. Pt's daughter found pt unresponsive.  Pt has a hx of depression. Her BAL was 388 upon arrival and UDS was + for THC. Pt is now under IVC. Pt is cooperative and oriented x 4. Her speech is coherent but a bit slurred. Her affect is sad. Pt describes mood as "up and down". She reports her husband died suddenly yesterday am. She says he uses a CPAP machine but they couldn't afford one after they lost their insurance. She says she tried to buy one online. Pt thinks he died from sleep apnea. She reports she intentionally ingested approx 10 Trazodone pills (unknown dose). Pt denies it was a suicide attempt. However, when writer asks what was going through pt's mind when she took the pills, pt replies, "My husband is dead." She reports she could keep herself safe if she was discharged because "I have lots of family around." She reports she drinks approx six 12-oz beers on the weekend and smokes "one joint" of marijuana every two months. Pt denies hx of outpatient or inpatient [facility] treatment. She says her mother is a Engineer, water and that is the only [facility] professional she has ever spoken with.   Diagnosis:  Major Depressive Disorder, Recurrent, Severe without Psychotic Features  Past Medical History:  Past Medical History  Diagnosis Date  . Hypertension   . Depression     History reviewed. No pertinent past surgical history.  Family History: History reviewed. No pertinent family history.  Social History:  reports that she quit smoking about 4 years ago. Her smoking use included Cigarettes. She has a 5 pack-year smoking history. She does not have any smokeless tobacco history on file. She reports that she drinks alcohol. She reports that she uses illicit drugs (Marijuana).  Additional Social History:  Alcohol / Drug Use Pain Medications: pt  denies abuse - see PTA meds list Prescriptions: pt denies abuse - see PTA meds list Over the Counter: pt denies abuse - see PTA meds list History of alcohol / drug use?: Yes Substance #1 Name of Substance 1: alcohol 1 - Age of First Use: teenager 1 - Amount (size/oz): six 12-oz beers 1 - Frequency: on the weekends  1 - Duration: years 1 - Last Use / Amount: 04/02/15 - unknown amount of beer Substance #2 Name of Substance 2: marijuana 2 - Amount (size/oz): one joint  2 - Frequency: once every two mos 2 - Last Use / Amount: 04/02/15 - one joint  CIWA: CIWA-Ar BP: 148/76 mmHg Pulse Rate: 111 COWS:    PATIENT STRENGTHS: (choose at least two) Ability for insight Average or above average intelligence Capable of independent living Communication skills General fund of knowledge Supportive family/friends  Allergies: No Known Allergies  Home Medications:  (Not in a hospital admission)  OB/GYN Status:  No LMP recorded. Patient is not currently having periods (Reason: IUD).  General Assessment Data Location of Assessment: [facility] TTS Assessment: In system Is this a Tele or Face-to-Face Assessment?: Tele Assessment Is this an Initial Assessment or a Re-assessment for this encounter?: Initial Assessment Marital status: Widowed (husband died yesterday) Is patient pregnant?: No Pregnancy Status: No Can pt return to current living arrangement?: Yes Admission Status: Involuntary Is patient capable of signing voluntary admission?: Yes Referral Source: Self/Family/Friend Insurance type: self pay     [place]  Name of Psychiatrist: none Name of Therapist: none  Education Status Is patient currently in school?: No Highest grade of school patient has completed: 58 Name of school: [employer] to self with the past 6 months Suicidal Ideation: No Has patient been a risk to self within the past 6 months prior to admission? : Yes Suicidal Intent: No Has patient had any  suicidal intent within the past 6 months prior to admission? : Yes Is patient at risk for suicide?: Yes Suicidal Plan?:  (pt overdosed on trazodone last night) Has patient had any suicidal plan within the past 6 months prior to admission? : Yes Access to Means: Yes Specify Access to Suicidal Means: access to meds What has been your use of drugs/alcohol within the last 12 months?: alcohol on the weekends, marijuana every 2 mos Previous Attempts/Gestures: No How many times?: 0 Other Self Harm Risks: none Triggers for Past Attempts:  (n/a) Intentional Self Injurious Behavior: None Family Suicide History: Unknown Recent stressful life event(s): Other (Comment) (husband died yesterday) Persecutory voices/beliefs?: No Depression: Yes Substance abuse history and/or treatment for substance abuse?: No Suicide prevention information given to non-admitted patients: Not applicable  Risk to Others within the past 6 months Homicidal Ideation: No Does patient have any lifetime risk of violence toward others beyond the six months prior to admission? : No Thoughts of Harm to Others: No Current Homicidal Intent: No Current Homicidal Plan: No Access to Homicidal Means: No Identified Victim: none History of harm to others?: No Assessment of Violence: None Noted Violent Behavior Description: pt denies hx violence Does patient have access to weapons?: No Criminal Charges Pending?: No Does patient have a court date: No Is patient on probation?: No  Psychosis Hallucinations: None noted Delusions: None noted  Mental Status Report Appearance/Hygiene: Unremarkable, In hospital gown Eye Contact: Good Motor Activity: Freedom of movement Speech: Logical/coherent, Slurred Level of Consciousness: Quiet/awake Mood: Depressed, Sad Affect: Sad, Depressed, Appropriate to circumstance Anxiety Level: Minimal Thought Processes: Coherent, Relevant Judgement: Unimpaired Orientation: Person, Place, Time,  Situation Obsessive Compulsive Thoughts/Behaviors: None  Cognitive Functioning Concentration: Normal Memory: Recent Intact, Remote Intact IQ: Average Insight: Fair Impulse Control: Poor Appetite: Good Sleep: Decreased Total Hours of Sleep: 6 (interrupted) Vegetative Symptoms: None  ADLScreening [facility] Assessment Services) Patient's cognitive ability adequate to safely complete daily activities?: Yes Patient able to express need for assistance with ADLs?: Yes Independently performs ADLs?: Yes (appropriate for developmental age)  Prior Inpatient Therapy Prior Inpatient Therapy: No  Prior Outpatient Therapy Prior Outpatient Therapy: No Does patient have an ACCT team?: No Does patient have Intensive In-House Services?  : No Does patient have [facility] services? : No Does patient have P4CC services?: No  ADL Screening (condition at time of admission) Patient's cognitive ability adequate to safely complete daily activities?: Yes Is the patient deaf or have difficulty hearing?: No Does the patient have difficulty seeing, even when wearing glasses/contacts?: No Does the patient have difficulty concentrating, remembering, or making decisions?: No Patient able to express need for assistance with ADLs?: Yes Does the patient have difficulty dressing or bathing?: No Independently performs ADLs?: Yes (appropriate for developmental age) Does the patient have difficulty walking or climbing stairs?: No Weakness of Legs: None Weakness of Arms/Hands: None  Home Assistive Devices/Equipment Home Assistive Devices/Equipment: None    Abuse/Neglect Assessment (Assessment to be complete while patient is alone) Physical Abuse: Denies Verbal Abuse: Yes, past (Comment) Sexual Abuse: Denies Exploitation of patient/patient's resources: Denies Self-Neglect: Denies  Advance Directives (For Healthcare) Does patient have an advance directive?: No Would patient like information on creating an  advanced directive?: No - patient declined information    Additional Information 1:1 In Past 12 Months?: No CIRT Risk: No Elopement Risk: No Does patient have medical clearance?: Yes     Disposition:  Disposition Initial Assessment Completed for this Encounter: Yes Disposition of Patient: Inpatient treatment program (laura davis recommends inpatient treatment) Type of inpatient treatment program: Adult  Nyoka Lint 04/03/2015 7:39 AM

## 2015-04-03 NOTE — ED Notes (Signed)
Pt given cup of water to drink. 

## 2015-04-03 NOTE — ED Notes (Signed)
Pt brought in by [facility] for c/o overdose; pt took an unknown amount of possible trazadone; pt given 2mg  of narcan in route to ED; pt is unresponsive upon arrival to ED; ems reports patient's husband died today suddenly; pt responds to painful stimuli

## 2015-04-03 NOTE — ED Notes (Signed)
CRITICAL VALUE ALERT  Critical value received: ETOH  Date of notification:  04/03/2015  Time of notification:  0212  Critical value read back:Yes.    Nurse who received alert:  Fabio Neighbors RN  MD notified (1st page):  Dr. Leonides Schanz  Time of first page:  [phone]

## 2015-04-04 NOTE — Progress Notes (Signed)
Patient ambulated in hallway on room air O2 sat remained between 94-95% no c/o of sob while ambulating. Kayla Sandoval [employer]

## 2015-04-04 NOTE — Progress Notes (Signed)
PROGRESS NOTE  Kayla Sandoval [id number] DOB: 1970-09-16 DOA: 04/03/2015 PCP: Pcp Not In System  Summary: 45 year old woman with history of depression whose husband unexpectedly died 04-07-22, presented to the emergency department after being found down unresponsive by her daughter. Initial history notable for trazodone overdose, complicated by alcohol intoxication. Found to be hypoxic. Further evaluation revealed bilateral bronchopneumonia with acute hypoxic respiratory failure. Initially had a prolonged QT secondary to trazodone overdose but this subsequently resolved. Medically clear regard to overdose but admitted for hypoxia and pneumonia. Plan for psychiatry consultation once hypoxia resolved.  Assessment/Plan: 1. Intentional overdose with trazodone [id number] tabs complicated by alcohol intoxication. Initial prolonged QT has resolved. Patient's husband died unexpectedly April 07, 2022.  2. Acute encephalopathy secondary to intentional overdose and alcohol, resolved.  3. Bilateral bronchopneumonia with hypoxia. Possibly aspiration although she has been sick for 1-2 weeks prior to admission. Therefore treated both for aspiration and atypical pneumonia. 4. Acute hypoxic respiratory failure secondary to above. Resolved. 5. Alcohol intoxication. Resolved. UDS positive for marijuana. 6. Prolonged QT, resolved. 7. Depression. 8. Tobacco use disorder.   Overall improving. Change oral antibiotics in the morning.  Anticipate medical clearance in the morning and psychiatry consultation.  Code Status: [place] DVT prophylaxis: SCDs Family Communication: No family bedside. Discussed with patient who understands and has no concerns at this time. Disposition Plan: Anticipate discharge within 2-3 days.   Murray Hodgkins, MD  [facility]  Pager [phone] If 7PM-7AM, please contact night-coverage at [email or web address], password [facility] 04/04/2015, 7:41 AM  LOS: 1 day   Consultants:  None  Procedures:  None    Antibiotics:  Azithromycin 2/21>>  Zosyn 2/21>>  HPI/Subjective: Feeling achy with mild SOB. Has not really had an appetite.   Objective: Filed Vitals:   04/03/15 1152 04/03/15 1948 04/03/15 2204 04/04/15 0458  BP: 133/88  142/85 121/77  Pulse: 111  85 81  Temp: 98.2 F (36.8 C)  99.3 F (37.4 C) 98.4 F (36.9 C)  TempSrc: Oral  Oral Oral  Resp:   20 20  Height:      Weight:      SpO2: 96% 98% 95% 95%   No intake or output data in the 24 hours ending 04/04/15 0741   Filed Weights   04/03/15 0131  Weight: 72.576 kg (160 lb)    Exam:  Afebrile, VSS, on [place]  General:  Appears calm and comfortable Cardiovascular: RRR, no m/r/g. No LE edema. Respiratory: CTA bilaterally, no w/r/r. Normal respiratory effort. Psychiatric: grossly normal mood and affect, speech fluent and appropriate  New data reviewed:  Influenza negative   Pertinent data since admission:  none  Pending data:  BC  Scheduled Meds: . azithromycin  500 mg Intravenous Q24H  . folic acid  1 mg Oral Daily  . ipratropium-albuterol  3 mL Nebulization Q6H WA  . multivitamin with minerals  1 tablet Oral Daily  . nicotine  14 mg Transdermal Daily  . piperacillin-tazobactam (ZOSYN)  IV  3.375 g Intravenous 3 times per day  . sodium chloride flush  3 mL Intravenous Q12H  . thiamine  100 mg Oral Daily   Or  . thiamine  100 mg Intravenous Daily   Continuous Infusions:   Principal Problem:   CAP (community acquired pneumonia) Active Problems:   Pneumonia   Overdose   Acute encephalopathy   Acute respiratory failure with hypoxia ([place])   Alcohol intoxication ([place])   Tobacco use disorder   Time spent 25  minutes   By signing  my name below, I, Rennis Harding attest that this documentation has been prepared under the direction and in the presence of Murray Hodgkins, MD Electronically signed: Rennis Harding  04/04/2015 10:50am   I personally performed the services described in this  documentation. All medical record entries made by the scribe were at my direction. I have reviewed the chart and agree that the record reflects my personal performance and is accurate and complete. Murray Hodgkins, MD

## 2015-04-04 NOTE — Progress Notes (Signed)
Present with patient for emotional and spiritual support in connection with her husband's recent sudden and unexpected death. She stated she is still very much in shock. We discussed her support system and we discussed her grief. Brief visit and will continue to offer support. She shared she had been able to talk with her nurses and that had been helpful.

## 2015-04-04 NOTE — Care Management Note (Signed)
Case Management Note  Patient Details  Name: Kayla Sandoval MRN: [id number] Date of Birth: [birth year]  Subjective/Objective:                  Pt admitted for pnuemonia and OD. Pt is from home and is ind with ADL's. Pt's husband died unexpectedly just prior to pt's admission. Pt will need TTS to determine eligibility for inpt psych treatment. Pt has no insurance, is unemployed and has no children living in the home. Pt is supposed to be on BP meds and antidepressants. Pt has not begin compliant with medications due to not having a MD to prescribe refills. Pt given list of options for PCP care in the [place] area.   Action/Plan: Pt may need app made for PCP f/u care. Pt may need MATCH voucher. Will cont to follow for DC planning.   Expected Discharge Date:    04/05/2015              Expected Discharge Plan:    home vs inpt psych  In-House Referral:  Development worker, community, Chaplain  Discharge planning Services  CM Consult  Post Acute Care Choice:  NA Choice offered to:  NA  DME Arranged:    DME Agency:     HH Arranged:    HH Agency:     Status of Service:  In process, will continue to follow  Medicare Important Message Given:    Date Medicare IM Given:    Medicare IM give by:    Date Additional Medicare IM Given:    Additional Medicare Important Message give by:     If discussed at [place] of Stay Meetings, dates discussed:    Additional Comments:  Sherald Barge, RN 04/04/2015, 1:46 PM

## 2015-04-05 DIAGNOSIS — T50902D Poisoning by unspecified drugs, medicaments and biological substances, intentional self-harm, subsequent encounter: Secondary | ICD-10-CM

## 2015-04-05 LAB — HIV ANTIBODY (ROUTINE TESTING W REFLEX): HIV Screen 4th Generation wRfx: NONREACTIVE

## 2015-04-05 MED ORDER — IPRATROPIUM-ALBUTEROL 0.5-2.5 (3) MG/3ML IN SOLN
3.0000 mL | Freq: Three times a day (TID) | RESPIRATORY_TRACT | Status: DC
  Administered 2015-04-05 – 2015-04-07 (×4): 3 mL via RESPIRATORY_TRACT
  Filled 2015-04-05 (×7): qty 3

## 2015-04-05 MED ORDER — AZITHROMYCIN 250 MG PO TABS: 500.0000 mg | ORAL_TABLET | Freq: Every day | ORAL | Status: DC

## 2015-04-05 MED ORDER — LISINOPRIL 10 MG PO TABS
20.0000 mg | ORAL_TABLET | Freq: Every day | ORAL | Status: DC
  Administered 2015-04-05 – 2015-04-07 (×3): 20 mg via ORAL
  Filled 2015-04-05 (×3): qty 2

## 2015-04-05 MED ORDER — LORAZEPAM 0.5 MG PO TABS
0.5000 mg | ORAL_TABLET | ORAL | Status: AC | PRN
  Administered 2015-04-05 – 2015-04-07 (×2): 0.5 mg via ORAL
  Filled 2015-04-05 (×3): qty 1

## 2015-04-05 NOTE — Plan of Care (Signed)
End of shift Report given to Vista Deck, RN. Tanzania Melchizedek Espinola, SN [facility].

## 2015-04-05 NOTE — Progress Notes (Signed)
Follow up visit with patient for support. Gave her grief support materials and a prayer shawl. We discussed spiritual and emotional pain--extreme sadness, hurt and isolating feelings, and uncertainty and feelings of anxiety. She said it made her feel better to have several staff listening to her story. Plan to follow up for continued support.

## 2015-04-05 NOTE — Plan of Care (Signed)
Report received by night nurse. Assumed care of pt. [place], [place].

## 2015-04-05 NOTE — Progress Notes (Signed)
[facility] PROGRESS NOTE  Kayla Sandoval [id number] DOB: [birth year] DOA: 04/03/2015 PCP: Pcp Not In System  Assessment/Plan: Intentional drug overdose  -with trazodone approximately [id number] tablets complicated by alcohol intoxication. -We'll request psychiatry consultation, suspect will need inpatient psychiatric placement. -Had QT prolongation initially but has resolved. -Patient is quite tearful during my exam today tell me the events surrounding the unexpected death of her husband on 04/04/2022. She tells me that at the time she thought it would be a good idea to "join him" and that's why she overdosed. She tells me now that she knows that was not the correct solution.  Acute encephalopathy -Secondary to intentional overdose, resolved.  Bronchopneumonia with hypoxemia -Likely due to aspiration. -Hypoxemia has resolved. -Chest x-ray is negative, with absence of fever, leukocytosis we'll elect to discontinue antibiotics and monitor her clinically.  Code Status: Full code Family Communication: Patient only  Disposition Plan: To be determined pending psychiatric consultation   Consultants:  Psychiatry   Antibiotics:  None   Subjective: Tearful  Objective: Filed Vitals:   04/05/15 0913 04/05/15 1135 04/05/15 1315 04/05/15 1419  BP: 160/96 172/108 160/125   Pulse: 99 95 88   Temp: 98.1 F (36.7 C)     TempSrc: Oral     Resp: 20     Height:      Weight:      SpO2: 100%   94%    Intake/Output Summary (Last 24 hours) at 04/05/15 1614 Last data filed at 04/05/15 1000  Gross per 24 hour  Intake    843 ml  Output      0 ml  Net    843 ml   Filed Weights   04/03/15 0131  Weight: 72.576 kg (160 lb)    Exam:   General:  Alert, awake, oriented 3  Cardiovascular: Regular rate and rhythm, no murmurs, rubs or gallops  Respiratory: Clear to auscultation bilaterally  Abdomen: Soft, nontender, nondistended, positive bowel sounds  Extremities: No  clubbing, cyanosis or edema, positive pulses   Neurologic:  Grossly intact and nonfocal  Data Reviewed: Basic Metabolic Panel:  Recent Labs Lab 04/03/15 0125  NA 145  K 3.4*  CL 106  CO2 24  GLUCOSE 98  BUN 6  CREATININE 0.37*  CALCIUM 9.0  MG 1.9   Liver Function Tests:  Recent Labs Lab 04/03/15 0125  AST 123*  ALT 51  ALKPHOS 101  BILITOT 1.0  PROT 7.4  ALBUMIN 4.3   No results for input(s): LIPASE, AMYLASE in the last 168 hours. No results for input(s): AMMONIA in the last 168 hours. CBC:  Recent Labs Lab 04/03/15 0125  WBC 3.9*  NEUTROABS 2.3  HGB 15.5*  HCT 42.7  MCV 105.4*  PLT 81*   Cardiac Enzymes: No results for input(s): CKTOTAL, CKMB, CKMBINDEX, TROPONINI in the last 168 hours. BNP (last 3 results) No results for input(s): BNP in the last 8760 hours.  ProBNP (last 3 results) No results for input(s): PROBNP in the last 8760 hours.  CBG:  Recent Labs Lab 04/03/15 0148  GLUCAP 95    Recent Results (from the past 240 hour(s))  Blood culture (routine x 2)     Status: None (Preliminary result)   Collection Time: 04/03/15  7:31 AM  Result Value Ref Range Status   Specimen Description BLOOD RIGHT ANTECUBITAL  Final   Special Requests BOTTLES DRAWN AEROBIC AND ANAEROBIC [place]  Final   Culture NO GROWTH 2 DAYS  Final  Report Status PENDING  Incomplete  Blood culture (routine x 2)     Status: None (Preliminary result)   Collection Time: 04/03/15  7:34 AM  Result Value Ref Range Status   Specimen Description BLOOD LEFT ANTECUBITAL  Final   Special Requests BOTTLES DRAWN AEROBIC AND ANAEROBIC [place]  Final   Culture NO GROWTH 2 DAYS  Final   Report Status PENDING  Incomplete     Studies: No results found.  Scheduled Meds: . [START ON 04/06/2015] azithromycin  500 mg Oral Daily  . folic acid  1 mg Oral Daily  . ipratropium-albuterol  3 mL Nebulization TID  . lisinopril  20 mg Oral Daily  . multivitamin with minerals  1 tablet  Oral Daily  . nicotine  14 mg Transdermal Daily  . piperacillin-tazobactam (ZOSYN)  IV  3.375 g Intravenous 3 times per day  . sodium chloride flush  3 mL Intravenous Q12H  . thiamine  100 mg Oral Daily   Or  . thiamine  100 mg Intravenous Daily   Continuous Infusions:   Principal Problem:   CAP (community acquired pneumonia) Active Problems:   Pneumonia   Overdose   Acute encephalopathy   Acute respiratory failure with hypoxia ([place])   Alcohol intoxication ([place])   Tobacco use disorder    Time spent: 25 minutes. Greater than 50% of this time was spent in direct contact with the patient coordinating care.    Lelon Frohlich  [facility] Pager [phone]  If 7PM-7AM, please contact night-coverage at [email or web address], password [facility] 04/05/2015, 4:14 PM  LOS: 2 days

## 2015-04-05 NOTE — Progress Notes (Signed)
Pharmacy Antibiotic Note  Kayla Sandoval is a 45 y.o. female admitted on 04/03/2015 with aspiration pneumonia.  Pharmacy has been consulted for zosyn dosing. Pt found unresponsive, attempted overdose with trazodone. Further evaluation revealed bilateral bronchopneumonia with acute hypoxic respiratory failure.   Plan: Zosyn 3.375g IV q8h (4 hour infusion).   PHARMACIST - PHYSICIAN COMMUNICATION CONCERNING: Antibiotic IV to Oral Route Change Policy  RECOMMENDATION: This patient is receiving Zithromax by the intravenous route.  Based on criteria approved by the Pharmacy and Therapeutics Committee, the antibiotic(s) is/are being converted to the equivalent oral dose form(s).  DESCRIPTION: These criteria include:  Patient being treated for a respiratory tract infection, urinary tract infection, cellulitis or clostridium difficile associated diarrhea if on metronidazole  The patient is not neutropenic and does not exhibit a GI malabsorption state  The patient is eating (either orally or via tube) and/or has been taking other orally administered medications for a least 24 hours  The patient is improving clinically and has a Tmax < 100.5  If you have questions about this conversion, please contact the Pharmacy Department  [x]   [phone] )  [place] Na  Height: 5\' 5"  (165.1 cm) Weight: 160 lb (72.576 kg) IBW/kg (Calculated) : 57  Temp (24hrs), Avg:98.5 F (36.9 C), Min:98.1 F (36.7 C), Max:98.8 F (37.1 C)   Recent Labs Lab 04/03/15 0125  WBC 3.9*  CREATININE 0.37*    Estimated Creatinine Clearance: 89.5 mL/min (by C-G formula based on Cr of 0.37).    No Known Allergies  Microbiology results: 2/20 BCx: pending  Thank you for allowing pharmacy to be a part of this patient's care.  Hart Robinsons, PharmD Clinical Pharmacist 04/05/2015 2:42 PM

## 2015-04-05 NOTE — Progress Notes (Signed)
Assessed pt. Pt states she was having pain all over and that she is depressed. Pt asked nurse to ask MD if she could have something for sleep. Notified NP and awaiting response.

## 2015-04-06 DIAGNOSIS — F10230 Alcohol dependence with withdrawal, uncomplicated: Secondary | ICD-10-CM

## 2015-04-06 DIAGNOSIS — F10239 Alcohol dependence with withdrawal, unspecified: Secondary | ICD-10-CM

## 2015-04-06 DIAGNOSIS — F10939 Alcohol use, unspecified with withdrawal, unspecified: Secondary | ICD-10-CM

## 2015-04-06 HISTORY — DX: Alcohol use, unspecified with withdrawal, unspecified: F10.939

## 2015-04-06 HISTORY — DX: Alcohol dependence with withdrawal, unspecified: F10.239

## 2015-04-06 MED ORDER — HALOPERIDOL LACTATE 5 MG/ML IJ SOLN
2.5000 mg | Freq: Once | INTRAMUSCULAR | Status: AC
  Administered 2015-04-06: 2.5 mg via INTRAVENOUS
  Filled 2015-04-06: qty 1

## 2015-04-06 MED ORDER — LORAZEPAM 2 MG/ML IJ SOLN
2.0000 mg | INTRAMUSCULAR | Status: DC | PRN
  Administered 2015-04-06 – 2015-04-07 (×5): 2 mg via INTRAVENOUS
  Filled 2015-04-06 (×5): qty 1

## 2015-04-06 NOTE — Progress Notes (Signed)
MD aware of pt's blood pressures and pt's behaviors. Pt has been trying to get up out of bed the entire shift. Pt had to be redirected constantly. Pt on fall precautions and suicide precautions. MD aware.

## 2015-04-06 NOTE — Progress Notes (Signed)
Pt extremely combative, agitated and restless. Ativan 2 mg given via IV. Will continue to monitor patient throughout the shift.

## 2015-04-06 NOTE — Progress Notes (Signed)
Pt is asleep and resting comfortably. Received verbal order to discontinue order for restraints. Will continue to monitor patient.

## 2015-04-06 NOTE — Progress Notes (Signed)
Pt extremely combative and verbally abusive. Pt is restless and fidgety with auditory and visual hallucinations. Pt unsteady on feet. MD contacted and made aware. Received verbal order with read back for Haldol 2.5 mg once.

## 2015-04-06 NOTE — Progress Notes (Signed)
Haldol 2.5 mg given to patient. Will continue to monitor patient throughout the shift.
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
## 2015-04-06 NOTE — Progress Notes (Signed)
[facility] PROGRESS NOTE  Kayla Sandoval [id number] DOB: [birth year] DOA: 04/03/2015 PCP: Pcp Not In System  Assessment/Plan: Intentional drug overdose  -with trazodone approximately [id number] tablets complicated by alcohol intoxication. -We'll request psychiatry consultation, suspect will need inpatient psychiatric placement. -Had QT prolongation initially but has resolved. -Patient is quite tearful during my exam today tell me the events surrounding the unexpected death of her husband on 04-09-2022. She tells me that at the time she thought it would be a good idea to "join him" and that's why she overdosed. She tells me now that she knows that was not the correct solution.  Acute alcohol withdrawals -She has been verbally abusive with staff, combative, hallucinating and is quite hypertensive and tachycardic. -Has been started on a CIWA protocol with Ativan, also required a one time 2.5 mg Haldol dose earlier today. -Is currently resting.  Bronchopneumonia with hypoxemia -Likely due to aspiration. -Hypoxemia has resolved. -Chest x-ray is negative, with absence of fever, leukocytosis we'll elect to discontinue antibiotics and monitor her clinically.  Code Status: Full code Family Communication: Patient only  Disposition Plan: To be determined pending psychiatric consultation   Consultants:  Psychiatry   Antibiotics:  None   Subjective: Sleeping peacefully  Objective: Filed Vitals:   04/05/15 2207 04/06/15 0000 04/06/15 0552 04/06/15 0922  BP: 129/112 139/107 160/107 151/104  Pulse: 107 110 113 130  Temp: 98.5 F (36.9 C)  98.7 F (37.1 C) 97.8 F (36.6 C)  TempSrc: Oral  Oral Oral  Resp: 20  20 20   Height:      Weight:      SpO2: 98% 96% 97% 98%    Intake/Output Summary (Last 24 hours) at 04/06/15 1132 Last data filed at 04/06/15 0954  Gross per 24 hour  Intake    723 ml  Output      0 ml  Net    723 ml   Filed Weights   04/03/15 0131  Weight:  72.576 kg (160 lb)    Exam:   General:  Sleeping  Cardiovascular: Regular rate and rhythm, no murmurs, rubs or gallops  Respiratory: Clear to auscultation bilaterally  Abdomen: Soft, nontender, nondistended, positive bowel sounds  Extremities: No clubbing, cyanosis or edema, positive pulses   Neurologic: Unable to fully assess  Data Reviewed: Basic Metabolic Panel:  Recent Labs Lab 04/03/15 0125  NA 145  K 3.4*  CL 106  CO2 24  GLUCOSE 98  BUN 6  CREATININE 0.37*  CALCIUM 9.0  MG 1.9   Liver Function Tests:  Recent Labs Lab 04/03/15 0125  AST 123*  ALT 51  ALKPHOS 101  BILITOT 1.0  PROT 7.4  ALBUMIN 4.3   No results for input(s): LIPASE, AMYLASE in the last 168 hours. No results for input(s): AMMONIA in the last 168 hours. CBC:  Recent Labs Lab 04/03/15 0125  WBC 3.9*  NEUTROABS 2.3  HGB 15.5*  HCT 42.7  MCV 105.4*  PLT 81*   Cardiac Enzymes: No results for input(s): CKTOTAL, CKMB, CKMBINDEX, TROPONINI in the last 168 hours. BNP (last 3 results) No results for input(s): BNP in the last 8760 hours.  ProBNP (last 3 results) No results for input(s): PROBNP in the last 8760 hours.  CBG:  Recent Labs Lab 04/03/15 0148  GLUCAP 95    Recent Results (from the past 240 hour(s))  Blood culture (routine x 2)     Status: None (Preliminary result)   Collection Time: 04/03/15  7:31 AM  Result Value Ref Range Status   Specimen Description BLOOD RIGHT ANTECUBITAL  Final   Special Requests BOTTLES DRAWN AEROBIC AND ANAEROBIC 6CC EACH  Final   Culture NO GROWTH 3 DAYS  Final   Report Status PENDING  Incomplete  Blood culture (routine x 2)     Status: None (Preliminary result)   Collection Time: 04/03/15  7:34 AM  Result Value Ref Range Status   Specimen Description BLOOD LEFT ANTECUBITAL  Final   Special Requests BOTTLES DRAWN AEROBIC AND ANAEROBIC [place]  Final   Culture NO GROWTH 3 DAYS  Final   Report Status PENDING  Incomplete      Studies: No results found.  Scheduled Meds: . folic acid  1 mg Oral Daily  . ipratropium-albuterol  3 mL Nebulization TID  . lisinopril  20 mg Oral Daily  . multivitamin with minerals  1 tablet Oral Daily  . nicotine  14 mg Transdermal Daily  . sodium chloride flush  3 mL Intravenous Q12H  . thiamine  100 mg Oral Daily   Or  . thiamine  100 mg Intravenous Daily   Continuous Infusions:   Principal Problem:   CAP (community acquired pneumonia) Active Problems:   Pneumonia   Overdose   Acute encephalopathy   Acute respiratory failure with hypoxia ([place])   Alcohol intoxication ([place])   Tobacco use disorder   Alcohol withdrawal ([place])    Time spent: 25 minutes. Greater than 50% of this time was spent in direct contact with the patient coordinating care.    Lelon Frohlich  [facility] Pager [phone]  If 7PM-7AM, please contact night-coverage at [email or web address], password [facility] 04/06/2015, 11:32 AM  LOS: 3 days

## 2015-04-07 DIAGNOSIS — F10239 Alcohol dependence with withdrawal, unspecified: Secondary | ICD-10-CM

## 2015-04-07 DIAGNOSIS — R45851 Suicidal ideations: Secondary | ICD-10-CM

## 2015-04-07 DIAGNOSIS — Z634 Disappearance and death of family member: Secondary | ICD-10-CM

## 2015-04-07 DIAGNOSIS — F4311 Post-traumatic stress disorder, acute: Secondary | ICD-10-CM

## 2015-04-07 HISTORY — DX: Disappearance and death of family member: Z63.4

## 2015-04-07 LAB — BASIC METABOLIC PANEL
ANION GAP: 9 (ref 5–15)
BUN: 12 mg/dL (ref 6–20)
CO2: 22 mmol/L (ref 22–32)
Calcium: 9.1 mg/dL (ref 8.9–10.3)
Chloride: 108 mmol/L (ref 101–111)
Creatinine, Ser: 0.4 mg/dL — ABNORMAL LOW (ref 0.44–1.00)
GFR calc Af Amer: >60 mL/min (ref >60)
GLUCOSE: 89 mg/dL (ref 65–99)
POTASSIUM: 3.4 mmol/L — AB (ref 3.5–5.1)
Sodium: 139 mmol/L (ref 135–145)

## 2015-04-07 MED ORDER — LORAZEPAM 0.5 MG PO TABS
0.5000 mg | ORAL_TABLET | Freq: Once | ORAL | Status: AC
  Administered 2015-04-07: 0.5 mg via ORAL
  Filled 2015-04-07: qty 1

## 2015-04-07 NOTE — Plan of Care (Signed)
Problem: Coping: Goal: Verbalizations of decreased anxiety will increase Outcome: Progressing When pt begins to answer questions she becomes tearful and withdrawn.   Problem: Physical Regulation: Goal: Ability to maintain a body temperature in the normal range will improve Outcome: Progressing See flowsheet. Pt's blood pressure is still not within defined limits.

## 2015-04-07 NOTE — Progress Notes (Signed)
Patient accepted at [facility], to Dr. Sabra Heck, room 300-2, can arrive at 20:30. Call report at [phone].   Verlon Setting, [place] Disposition staff 04/07/2015 5:26 PM

## 2015-04-07 NOTE — Progress Notes (Signed)
Late entry:  Patient tearful.  Received Ativan 0.5 mg and stated it help with her anxiety.  Dr. Jerilee Hoh notified and gave order for one time dose of Ativan 0.5 mg po.

## 2015-04-07 NOTE — Discharge Summary (Signed)
Physician Discharge Summary  Kayla Sandoval [id number] DOB: 1970-12-21 DOA: 04/03/2015  PCP: Pcp Not In System  Admit date: 04/03/2015 Discharge date: 04/07/2015  Time spent: 45 minutes  Recommendations for Outpatient Follow-up:  -Will be discharged to [facility] for inpatient psychiatric care.   Discharge Diagnoses:  Active Problems:   Pneumonia   CAP (community acquired pneumonia)   Overdose   Acute encephalopathy   Acute respiratory failure with hypoxia ([place])   Alcohol intoxication ([place])   Tobacco use disorder   Alcohol withdrawal ([place])   Recent bereavement   Discharge Condition: Stable  Filed Weights   04/03/15 0131  Weight: 72.576 kg (160 lb)    History of present illness:  As per Dr. Sarajane Jews 59/6: 45 year old woman with history of depression whose husband unexpectedly died 2022/04/30, presented to the emergency department after being found down unresponsive by her daughter. Initial history notable for trazodone overdose, complicated by alcohol intoxication. Found to be hypoxic. Further evaluation revealed bilateral bronchopneumonia with acute hypoxic respiratory failure. Initially had a prolonged QT secondary to trazodone overdose but this subsequently resolved. In regard to her overdose, she is medically clear but will be admitted for pneumonia and hypoxia.   Per patient Sunday morning she found her found deceased. She admits she typically takes 3 Trazodones but on this morning took 10. Although she denies SI she does admit just wanting to "go away." She states she has a strong family support. She also drank a lot of alcohol to make the pain go away. Currently denies suicidal ideation.  In the emergency department: temp 96.3>>98.1, VSS except hypoxia on RA. Pertinent labs: alcohol 388, plt 81, carboxyhemoglobin 4.5, ABG 7.357/39/145. AST 123. CMP otherwise unremarkable. Pregnancy negative. Tylenol and salicylate negative. UDS positive for THC EKG: Independently reviewed.  0126: SR prolonged QT. 0620: ST, normal QT, no acute changes. Imaging: independently reviewed. CT chest bilateral bronchopenumonia, no PE. CT head and neck negative. CXR independently reviewed: NAD.   Hospital Course:   Intentional drug overdose  -with trazodone approximately [id number] tablets complicated by alcohol intoxication. -Seen by psychiatry, will need inpatient psychiatric placement. -Had QT prolongation initially but has resolved. -She tells me the events surrounding the unexpected death of her husband on 04/30/22. She tells me that at the time she thought it would be a good idea to "join him" and that's why she overdosed. She tells me now that she knows that was not the correct solution.  Acute alcohol withdrawals -Seem to have mostly resolved. -Continue CIWA protocol.  ??Bronchopneumonia with hypoxemia -Likely due to aspiration. -Hypoxemia has resolved. -Chest x-ray is negative, with absence of fever, leukocytosis we'll elect to discontinue antibiotics and monitor her clinically. -No abx required on discharge.  Procedures:  None   Consultations:  Psychiatry  Discharge Instructions     Medication List    TAKE these medications        ibuprofen 600 MG tablet  Commonly known as:  ADVIL,MOTRIN  Take 600 mg by mouth every 6 (six) hours as needed for moderate pain.       No Known Allergies    The results of significant diagnostics from this hospitalization (including imaging, microbiology, ancillary and laboratory) are listed below for reference.    Significant Diagnostic Studies: Ct Head Wo Contrast  04/03/2015  CLINICAL DATA:  Found down on floor, unresponsive. Drug overdose. Initial encounter. EXAM: CT HEAD WITHOUT CONTRAST CT CERVICAL SPINE WITHOUT CONTRAST TECHNIQUE: Multidetector CT imaging of the head and cervical spine was  performed following the standard protocol without intravenous contrast. Multiplanar CT image reconstructions of the cervical spine were  also generated. COMPARISON:  None. FINDINGS: CT HEAD FINDINGS There is no evidence of acute infarction, mass lesion, or intra- or extra-axial hemorrhage on CT. The posterior fossa, including the cerebellum, brainstem and fourth ventricle, is within normal limits. The third and lateral ventricles, and basal ganglia are unremarkable in appearance. The cerebral hemispheres are symmetric in appearance, with normal gray-white differentiation. No mass effect or midline shift is seen. There is no evidence of fracture; visualized osseous structures are unremarkable in appearance. The visualized portions of the orbits are within normal limits. Mucosal thickening is noted at the right maxillary sinus. The remaining paranasal sinuses and mastoid air cells are well-aerated. No significant soft tissue abnormalities are seen. CT CERVICAL SPINE FINDINGS There is no evidence of fracture or subluxation. Vertebral bodies demonstrate normal height and alignment. Intervertebral disc spaces are preserved. Prevertebral soft tissues are within normal limits. The visualized neural foramina are grossly unremarkable. The thyroid gland is unremarkable in appearance. The visualized lung apices are clear. No significant soft tissue abnormalities are seen. IMPRESSION: 1. No evidence of traumatic intracranial injury or fracture. 2. No evidence of fracture or subluxation along the cervical spine. 3. Mucosal thickening at the right maxillary sinus. Electronically Signed   By: Garald Balding M.D.   On: 04/03/2015 02:55   Ct Angio Chest Pe W/cm &/or Wo Cm  04/03/2015  CLINICAL DATA:  Hypoxia. Shortness of breath. Mid sternal chest pain. EXAM: CT ANGIOGRAPHY CHEST WITH CONTRAST TECHNIQUE: Multidetector CT imaging of the chest was performed using the standard protocol during bolus administration of intravenous contrast. Multiplanar CT image reconstructions and MIPs were obtained to evaluate the vascular anatomy. CONTRAST:  17mL OMNIPAQUE IOHEXOL  350 MG/ML SOLN COMPARISON:  Radiography same day.  CT 03/29/2011. FINDINGS: Pulmonary arterial opacification is good. There are no pulmonary emboli. No evidence of a aortic pathology. No visible coronary artery calcification. No pleural or pericardial fluid. There is patchy airspace filling affecting the right upper lobe posteriorly and both lower lobes. This is consistent with bronchopneumonia. Largest area of consolidation is at the right base. No lobar collapse. Scans in the upper abdomen are unremarkable. Review of the MIP images confirms the above findings. IMPRESSION: No pulmonary emboli. Bilateral bronchopneumonia without lobar collapse. Electronically Signed   By: Nelson Chimes M.D.   On: 04/03/2015 07:14   Ct Cervical Spine Wo Contrast  04/03/2015  CLINICAL DATA:  Found down on floor, unresponsive. Drug overdose. Initial encounter. EXAM: CT HEAD WITHOUT CONTRAST CT CERVICAL SPINE WITHOUT CONTRAST TECHNIQUE: Multidetector CT imaging of the head and cervical spine was performed following the standard protocol without intravenous contrast. Multiplanar CT image reconstructions of the cervical spine were also generated. COMPARISON:  None. FINDINGS: CT HEAD FINDINGS There is no evidence of acute infarction, mass lesion, or intra- or extra-axial hemorrhage on CT. The posterior fossa, including the cerebellum, brainstem and fourth ventricle, is within normal limits. The third and lateral ventricles, and basal ganglia are unremarkable in appearance. The cerebral hemispheres are symmetric in appearance, with normal gray-white differentiation. No mass effect or midline shift is seen. There is no evidence of fracture; visualized osseous structures are unremarkable in appearance. The visualized portions of the orbits are within normal limits. Mucosal thickening is noted at the right maxillary sinus. The remaining paranasal sinuses and mastoid air cells are well-aerated. No significant soft tissue abnormalities are  seen. CT CERVICAL SPINE FINDINGS There  is no evidence of fracture or subluxation. Vertebral bodies demonstrate normal height and alignment. Intervertebral disc spaces are preserved. Prevertebral soft tissues are within normal limits. The visualized neural foramina are grossly unremarkable. The thyroid gland is unremarkable in appearance. The visualized lung apices are clear. No significant soft tissue abnormalities are seen. IMPRESSION: 1. No evidence of traumatic intracranial injury or fracture. 2. No evidence of fracture or subluxation along the cervical spine. 3. Mucosal thickening at the right maxillary sinus. Electronically Signed   By: Garald Balding M.D.   On: 04/03/2015 02:55   Dg Chest Port 1 View  04/03/2015  CLINICAL DATA:  Wheezing. EXAM: PORTABLE CHEST 1 VIEW COMPARISON:  04/05/2011 FINDINGS: The cardiomediastinal contours are normal. Resolution of pleural parenchymal opacity at the left lung base. Pulmonary vasculature is normal. No consolidation, pleural effusion, or pneumothorax. No acute osseous abnormalities are seen. Bowel right rib fractures are seen. IMPRESSION: No acute pulmonary process. Electronically Signed   By: Jeb Levering M.D.   On: 04/03/2015 02:37    Microbiology: Recent Results (from the past 240 hour(s))  Blood culture (routine x 2)     Status: None (Preliminary result)   Collection Time: 04/03/15  7:31 AM  Result Value Ref Range Status   Specimen Description BLOOD RIGHT ANTECUBITAL  Final   Special Requests BOTTLES DRAWN AEROBIC AND ANAEROBIC 6CC EACH  Final   Culture NO GROWTH 4 DAYS  Final   Report Status PENDING  Incomplete  Blood culture (routine x 2)     Status: None (Preliminary result)   Collection Time: 04/03/15  7:34 AM  Result Value Ref Range Status   Specimen Description BLOOD LEFT ANTECUBITAL  Final   Special Requests BOTTLES DRAWN AEROBIC AND ANAEROBIC 6CC EACH  Final   Culture NO GROWTH 4 DAYS  Final   Report Status PENDING  Incomplete      Labs: Basic Metabolic Panel:  Recent Labs Lab 04/03/15 0125 04/07/15 0535  NA 145 139  K 3.4* 3.4*  CL 106 108  CO2 24 22  GLUCOSE 98 89  BUN 6 12  CREATININE 0.37* 0.40*  CALCIUM 9.0 9.1  MG 1.9  --    Liver Function Tests:  Recent Labs Lab 04/03/15 0125  AST 123*  ALT 51  ALKPHOS 101  BILITOT 1.0  PROT 7.4  ALBUMIN 4.3   No results for input(s): LIPASE, AMYLASE in the last 168 hours. No results for input(s): AMMONIA in the last 168 hours. CBC:  Recent Labs Lab 04/03/15 0125  WBC 3.9*  NEUTROABS 2.3  HGB 15.5*  HCT 42.7  MCV 105.4*  PLT 81*   Cardiac Enzymes: No results for input(s): CKTOTAL, CKMB, CKMBINDEX, TROPONINI in the last 168 hours. BNP: BNP (last 3 results) No results for input(s): BNP in the last 8760 hours.  ProBNP (last 3 results) No results for input(s): PROBNP in the last 8760 hours.  CBG:  Recent Labs Lab 04/03/15 0148  GLUCAP 95       Signed:  Lelon Frohlich  [facility] Pager: [phone] 04/07/2015, 6:24 PM

## 2015-04-07 NOTE — Progress Notes (Signed)
Patient was assessed by TSS and inpatient placement recommendation given by Psych for follow up.  IVC paper work in place and has been submitted to [facility]; Custody order in place.  [facility] provided bed offer and information re: report.  Discussed with unit nurse and with nursing supervisor.  No further CSW needs identified. CSW signing off.  Lorie Phenix. Pauline Good, [place]

## 2015-04-07 NOTE — Progress Notes (Signed)
Patient admitted due to suicide attempt- overdose after recent death of her husband.  She is awaiting TTS; per nursing- yesterday became extremely combative with staff. She has a Actuary and is poorly responsive today.  CSW anticipates need for inpatient psych admission but will need to await Psych recommendations.  Lorie Phenix. Pauline Good, [place]

## 2015-04-07 NOTE — Consult Note (Signed)
Telepsych Consultation   Reason for Consult: Suicide attempt  Referring Physician: Forestine Na EDP Patient Identification: Kayla Sandoval MRN:  [id number] Principal Diagnosis: Acute stress disorder  Diagnosis:   Patient Active Problem List   Diagnosis Date Noted  . Recent bereavement [Z78.9] 04/07/2015  . Alcohol withdrawal ([place]) [F10.239] 04/06/2015  . CAP (community acquired pneumonia) [J18.9] 04/03/2015  . Overdose [T50.901A] 04/03/2015  . Acute encephalopathy [G93.40] 04/03/2015  . Acute respiratory failure with hypoxia ([place]) [J96.01] 04/03/2015  . Alcohol intoxication ([place]) [F10.129] 04/03/2015  . Tobacco use disorder [F17.200] 04/03/2015  . Hemothorax on left [J94.2] 04/09/2011  . Pleural effusion [J90] 03/27/2011  . Pneumonia [J18.9] 03/27/2011  . Tobacco abuse [Z72.0] 03/27/2011  . Hypertension [I10] 03/27/2011  . Macrocytosis without anemia [D75.89] 03/27/2011  . Depression [F32.9] 03/27/2011    Total Time spent with patient: 30 minutes  Subjective:   Kayla Sandoval is a 45 y.o. female patient admitted under IVC after attempting suicide in the context of finding her husband deceased last May 21, 2022. Patient continues to struggle with the sudden death of her husband. Patient states "I was doing fine before this. I had been off medications for depression for several years. I was not expecting this. My heart really hurts. I have children to live for. I am starting to deal with the reality. I want to go stay with my mother after I leave here."   HPI:    Kayla Sandoval is a 45 year old woman history of depression whose husband unexpectedly died 05/01/2022, presented to the [facility] Emergency department after being found down unresponsive by her daughter. Initial history notable for trazodone overdose, complicated by alcohol intoxication. Found to be hypoxic. Further evaluation revealed bilateral bronchopneumonia with acute hypoxic respiratory failure. Initially had a prolonged QT  secondary to trazodone overdose but this subsequently resolved. In regard to her overdose, she is medically clear but is being treated for pneumonia and hypoxia.  Per patient 2022/05/21 morning she found her found deceased. She admits she typically takes three Trazodones but on this morning took ten. Although she denies SI she does admit just wanting to "go away." She states she has a strong family support. She also drank a lot of alcohol to make the pain go away. Patient currently denies suicidal ideation. During the assessment the patient was very tearful and depressed. Patient reports that her heart is very heavy with grief. Jinny reports that Lexapro worked well in the past to help with depressive symptoms. Patient appears to at continued risk for self harm due to symptoms of reactive sadness related to the loss. Nursing staff report that yesterday the patient became agitated yesterday requiring Haldol IV. Patient will need inpatient stabilization for a few days with referral for grief counseling at discharge. The patient reported feeling stable to go stay with mother. This Probation officer explained to patient that she is at continued risk for self harm due to recent trauma. Nursing staff at [facility] report that the husband's funeral was today. Patient denies any psychotic symptoms or past manic episodes. She is interested in being started on an antidepressant to help with her symptoms. She denies any regular pattern of alcohol use reporting that she only drank to cope with her husband's sudden death.   Past Psychiatric History: Depression/anxiety   Risk to Self: Suicidal Ideation: No Suicidal Intent: No Is patient at risk for suicide?: Yes Suicidal Plan?:  (pt overdosed on trazodone last night) Access to Means: Yes Specify Access to Suicidal  Means: access to meds What has been your use of drugs/alcohol within the last 12 months?: alcohol on the weekends, marijuana every 2 mos How many times?: 0 Other Self  Harm Risks: none Triggers for Past Attempts:  (n/a) Intentional Self Injurious Behavior: None Risk to Others: Homicidal Ideation: No Thoughts of Harm to Others: No Current Homicidal Intent: No Current Homicidal Plan: No Access to Homicidal Means: No Identified Victim: none History of harm to others?: No Assessment of Violence: None Noted Violent Behavior Description: pt denies hx violence Does patient have access to weapons?: No Criminal Charges Pending?: No Does patient have a court date: No Prior Inpatient Therapy: Prior Inpatient Therapy: No Prior Outpatient Therapy: Prior Outpatient Therapy: No Does patient have an ACCT team?: No Does patient have Intensive In-House Services?  : No Does patient have [facility] services? : No Does patient have P4CC services?: No  Past Medical History:  Past Medical History  Diagnosis Date  . Hypertension   . Depression     Past Surgical History  Procedure Laterality Date  . Chest thoracostomy     Family History: History reviewed. No pertinent family history. Social History:  History  Alcohol Use  . 3.6 oz/week  . 6 Standard drinks or equivalent per week     History  Drug Use  . Yes  . Special: Marijuana    Social History   Social History  . Marital Status: Married    Spouse Name: N/A  . Number of Children: N/A  . Years of Education: N/A   Social History Main Topics  . Smoking status: Current Every Day Smoker -- 1.00 packs/day for 10 years    Types: Cigarettes    Last Attempt to Quit: 03/15/2011  . Smokeless tobacco: None  . Alcohol Use: 3.6 oz/week    6 Standard drinks or equivalent per week  . Drug Use: Yes    Special: Marijuana  . Sexual Activity: Yes   Other Topics Concern  . None   Social History Narrative   Smoked "a few cigarettes a day" for many years up until 02/2011. Drinks "a few" drinks of alcohol on weekend, never until drunk or passing out.  Denies illicit drug use.  Stay at home mom.   Additional  Social History:    Allergies:  No Known Allergies  Labs:  Results for orders placed or performed during the hospital encounter of 04/03/15 (from the past 48 hour(s))  Basic metabolic panel     Status: Abnormal   Collection Time: 04/07/15  5:35 AM  Result Value Ref Range   Sodium 139 135 - 145 mmol/L   Potassium 3.4 (L) 3.5 - 5.1 mmol/L   Chloride 108 101 - 111 mmol/L   CO2 22 22 - 32 mmol/L   Glucose, Bld 89 65 - 99 mg/dL   BUN 12 6 - 20 mg/dL   Creatinine, Ser 0.40 (L) 0.44 - 1.00 mg/dL   Calcium 9.1 8.9 - 10.3 mg/dL   GFR calc non Af Amer >60 >60 mL/min   GFR calc Af Amer >60 >60 mL/min    Comment: (NOTE) The eGFR has been calculated using the CKD EPI equation. This calculation has not been validated in all clinical situations. eGFR's persistently <60 mL/min signify possible Chronic Kidney Disease.    Anion gap 9 5 - 15    Current Facility-Administered Medications  Medication Dose Route Frequency Provider Last Rate Last Dose  . 0.9 %  sodium chloride infusion  250 mL Intravenous PRN  Samuella Cota, MD 10 mL/hr at 04/05/15 0958 250 mL at 04/05/15 0958  . acetaminophen (TYLENOL) tablet 650 mg  650 mg Oral Q6H PRN Samuella Cota, MD   650 mg at 04/05/15 2126   Or  . acetaminophen (TYLENOL) suppository 650 mg  650 mg Rectal Q6H PRN Samuella Cota, MD      . albuterol (PROVENTIL) (2.5 MG/3ML) 0.083% nebulizer solution 2.5 mg  2.5 mg Nebulization Q4H PRN Samuella Cota, MD   2.5 mg at 04/06/15 2244  . folic acid (FOLVITE) tablet 1 mg  1 mg Oral Daily Samuella Cota, MD   1 mg at 04/07/15 1119  . ipratropium-albuterol (DUONEB) 0.5-2.5 (3) MG/3ML nebulizer solution 3 mL  3 mL Nebulization TID Erline Hau, MD   3 mL at 04/07/15 1437  . lisinopril (PRINIVIL,ZESTRIL) tablet 20 mg  20 mg Oral Daily Erline Hau, MD   20 mg at 04/07/15 1119  . LORazepam (ATIVAN) injection 2 mg  2 mg Intravenous Q3H PRN Orvan Falconer, MD   2 mg at 04/07/15 0421  .  multivitamin with minerals tablet 1 tablet  1 tablet Oral Daily Samuella Cota, MD   1 tablet at 04/07/15 1119  . nicotine (NICODERM CQ - dosed in mg/24 hours) patch 14 mg  14 mg Transdermal Daily Samuella Cota, MD   14 mg at 04/07/15 1117  . sodium chloride flush (NS) 0.9 % injection 3 mL  3 mL Intravenous Q12H Samuella Cota, MD   3 mL at 04/06/15 2200  . sodium chloride flush (NS) 0.9 % injection 3 mL  3 mL Intravenous PRN Samuella Cota, MD      . thiamine (VITAMIN B-1) tablet 100 mg  100 mg Oral Daily Samuella Cota, MD   100 mg at 04/07/15 1119   Or  . thiamine (B-1) injection 100 mg  100 mg Intravenous Daily Samuella Cota, MD   100 mg at 04/06/15 4098    Musculoskeletal:  Unable to assess via camera   Psychiatric Specialty Exam: Review of Systems  Constitutional: Positive for malaise/fatigue.  Psychiatric/Behavioral: Positive for depression, suicidal ideas and substance abuse. Negative for hallucinations and memory loss. The patient is nervous/anxious. The patient does not have insomnia.     Blood pressure 126/94, pulse 118, temperature 99 F (37.2 C), temperature source Oral, resp. rate 20, height '5\' 5"'  (1.651 m), weight 72.576 kg (160 lb), SpO2 96 %.Body mass index is 26.63 kg/(m^2).  General Appearance: Disheveled  Eye Sport and exercise psychologist::  Fair  Speech:  Clear and Coherent  Volume:  Normal  Mood:  Dysphoric  Affect:  Tearful  Thought Process:  Goal Directed and Intact  Orientation:  Full (Time, Place, and Person)  Thought Content:  Events surrounding husband's death  Suicidal Thoughts:  Yes.  without intent/plan  Homicidal Thoughts:  No  Memory:  Immediate;   Good Recent;   Good Remote;   Good  Judgement:  Fair  Insight:  Present  Psychomotor Activity:  Decreased  Concentration:  Fair  Recall:  Good  Fund of Knowledge:Good  Language: Good  Akathisia:  No  Handed:  Right  AIMS (if indicated):     Assets:  Communication Skills Desire for  Improvement Housing Intimacy Leisure Time Physical Health Resilience Social Support  ADL's:  Intact  Cognition: WNL  Sleep:      Disposition: Recommend psychiatric Inpatient admission when medically cleared. Supportive therapy provided about ongoing stressors.  Elmarie Shiley, NP 04/07/2015 3:14 PM

## 2015-04-07 NOTE — Progress Notes (Signed)
[facility] PROGRESS NOTE  Kayla Sandoval LA:[id number] DOB: 1970/12/15 DOA: 04/03/2015 PCP: Pcp Not In System  Assessment/Plan: Intentional drug overdose  -with trazodone approximately [id number] tablets complicated by alcohol intoxication. -Awaiting psychiatry evaluation, suspect will need inpatient psychiatric placement. -Had QT prolongation initially but has resolved. -She tells me the events surrounding the unexpected death of her husband on 04-03-2022. She tells me that at the time she thought it would be a good idea to "join him" and that's why she overdosed. She tells me now that she knows that was not the correct solution.  Acute alcohol withdrawals -Seem to have mostly resolved. -Continue CIWA protocol.  ??Bronchopneumonia with hypoxemia -Likely due to aspiration. -Hypoxemia has resolved. -Chest x-ray is negative, with absence of fever, leukocytosis we'll elect to discontinue antibiotics and monitor her clinically.  Code Status: Full code Family Communication: Patient only  Disposition Plan: To be determined pending psychiatric consultation   Consultants:  Psychiatry   Antibiotics:  None   Subjective: No complaints. Appears listless and does not readily make eye contact today. She asks me "when can I go home?".  Objective: Filed Vitals:   04/06/15 2143 04/06/15 2245 04/07/15 0646 04/07/15 1115  BP: 136/104  111/67 132/89  Pulse: 111  98 105  Temp: 98.7 F (37.1 C)  99 F (37.2 C)   TempSrc: Oral  Oral   Resp: 20  20   Height:      Weight:      SpO2: 96% 93% 95%     Intake/Output Summary (Last 24 hours) at 04/07/15 1325 Last data filed at 04/07/15 0949  Gross per 24 hour  Intake    240 ml  Output      0 ml  Net    240 ml   Filed Weights   04/03/15 0131  Weight: 72.576 kg (160 lb)    Exam:   General:  AA Ox3  Cardiovascular: Regular rate and rhythm, no murmurs, rubs or gallops  Respiratory: Clear to auscultation bilaterally  Abdomen:  Soft, nontender, nondistended, positive bowel sounds  Extremities: No clubbing, cyanosis or edema, positive pulses   Neurologic: grossly intact and non-focal  Data Reviewed: Basic Metabolic Panel:  Recent Labs Lab 04/03/15 0125 04/07/15 0535  NA 145 139  K 3.4* 3.4*  CL 106 108  CO2 24 22  GLUCOSE 98 89  BUN 6 12  CREATININE 0.37* 0.40*  CALCIUM 9.0 9.1  MG 1.9  --    Liver Function Tests:  Recent Labs Lab 04/03/15 0125  AST 123*  ALT 51  ALKPHOS 101  BILITOT 1.0  PROT 7.4  ALBUMIN 4.3   No results for input(s): LIPASE, AMYLASE in the last 168 hours. No results for input(s): AMMONIA in the last 168 hours. CBC:  Recent Labs Lab 04/03/15 0125  WBC 3.9*  NEUTROABS 2.3  HGB 15.5*  HCT 42.7  MCV 105.4*  PLT 81*   Cardiac Enzymes: No results for input(s): CKTOTAL, CKMB, CKMBINDEX, TROPONINI in the last 168 hours. BNP (last 3 results) No results for input(s): BNP in the last 8760 hours.  ProBNP (last 3 results) No results for input(s): PROBNP in the last 8760 hours.  CBG:  Recent Labs Lab 04/03/15 0148  GLUCAP 95    Recent Results (from the past 240 hour(s))  Blood culture (routine x 2)     Status: None (Preliminary result)   Collection Time: 04/03/15  7:31 AM  Result Value Ref Range Status   Specimen Description  BLOOD RIGHT ANTECUBITAL  Final   Special Requests BOTTLES DRAWN AEROBIC AND ANAEROBIC 6CC EACH  Final   Culture NO GROWTH 4 DAYS  Final   Report Status PENDING  Incomplete  Blood culture (routine x 2)     Status: None (Preliminary result)   Collection Time: 04/03/15  7:34 AM  Result Value Ref Range Status   Specimen Description BLOOD LEFT ANTECUBITAL  Final   Special Requests BOTTLES DRAWN AEROBIC AND ANAEROBIC [place]  Final   Culture NO GROWTH 4 DAYS  Final   Report Status PENDING  Incomplete     Studies: No results found.  Scheduled Meds: . folic acid  1 mg Oral Daily  . ipratropium-albuterol  3 mL Nebulization TID  .  lisinopril  20 mg Oral Daily  . multivitamin with minerals  1 tablet Oral Daily  . nicotine  14 mg Transdermal Daily  . sodium chloride flush  3 mL Intravenous Q12H  . thiamine  100 mg Oral Daily   Or  . thiamine  100 mg Intravenous Daily   Continuous Infusions:   Principal Problem:   CAP (community acquired pneumonia) Active Problems:   Pneumonia   Overdose   Acute encephalopathy   Acute respiratory failure with hypoxia ([place])   Alcohol intoxication ([place])   Tobacco use disorder   Alcohol withdrawal ([place])    Time spent: 25 minutes. Greater than 50% of this time was spent in direct contact with the patient coordinating care.    Lelon Frohlich  [facility] Pager [phone]  If 7PM-7AM, please contact night-coverage at [email or web address], password [facility] 04/07/2015, 1:25 PM  LOS: 4 days

## 2015-04-07 NOTE — Progress Notes (Signed)
[place] and spoke to Lino [place] at [facility] to schedule tele-psychiatry consultation.  Otila Kluver stated she would call back with appointment time.

## 2015-04-08 ENCOUNTER — Encounter (HOSPITAL_COMMUNITY): Payer: Self-pay | Admitting: Nurse Practitioner

## 2015-04-08 ENCOUNTER — Inpatient Hospital Stay (HOSPITAL_COMMUNITY)
Admission: AD | Admit: 2015-04-08 | Discharge: 2015-04-10 | DRG: 885 | Disposition: A | Discharge status: Home / Self Care | Payer: No Typology Code available for payment source | Source: Intra-hospital | Attending: Psychiatry | Admitting: Psychiatry

## 2015-04-08 DIAGNOSIS — F17210 Nicotine dependence, cigarettes, uncomplicated: Secondary | ICD-10-CM | POA: Diagnosis present

## 2015-04-08 DIAGNOSIS — Z8249 Family history of ischemic heart disease and other diseases of the circulatory system: Secondary | ICD-10-CM | POA: Diagnosis not present

## 2015-04-08 DIAGNOSIS — F4323 Adjustment disorder with mixed anxiety and depressed mood: Secondary | ICD-10-CM | POA: Diagnosis not present

## 2015-04-08 DIAGNOSIS — F439 Reaction to severe stress, unspecified: Secondary | ICD-10-CM

## 2015-04-08 DIAGNOSIS — F10129 Alcohol abuse with intoxication, unspecified: Secondary | ICD-10-CM | POA: Diagnosis present

## 2015-04-08 DIAGNOSIS — F411 Generalized anxiety disorder: Secondary | ICD-10-CM | POA: Diagnosis present

## 2015-04-08 DIAGNOSIS — Z634 Disappearance and death of family member: Secondary | ICD-10-CM

## 2015-04-08 DIAGNOSIS — F410 Panic disorder [episodic paroxysmal anxiety] without agoraphobia: Secondary | ICD-10-CM | POA: Diagnosis present

## 2015-04-08 DIAGNOSIS — F332 Major depressive disorder, recurrent severe without psychotic features: Principal | ICD-10-CM

## 2015-04-08 DIAGNOSIS — G4700 Insomnia, unspecified: Secondary | ICD-10-CM

## 2015-04-08 DIAGNOSIS — Z801 Family history of malignant neoplasm of trachea, bronchus and lung: Secondary | ICD-10-CM

## 2015-04-08 DIAGNOSIS — F4321 Adjustment disorder with depressed mood: Secondary | ICD-10-CM | POA: Insufficient documentation

## 2015-04-08 DIAGNOSIS — F430 Acute stress reaction: Secondary | ICD-10-CM | POA: Diagnosis present

## 2015-04-08 DIAGNOSIS — I10 Essential (primary) hypertension: Secondary | ICD-10-CM | POA: Diagnosis present

## 2015-04-08 HISTORY — DX: Acute stress reaction: F43.0

## 2015-04-08 MED ORDER — HYDROXYZINE HCL 25 MG PO TABS
25.0000 mg | ORAL_TABLET | Freq: Three times a day (TID) | ORAL | Status: DC | PRN
  Administered 2015-04-08 – 2015-04-10 (×7): 25 mg via ORAL
  Filled 2015-04-08 (×3): qty 1
  Filled 2015-04-08: qty 10
  Filled 2015-04-08 (×5): qty 1

## 2015-04-08 MED ORDER — IPRATROPIUM-ALBUTEROL 0.5-2.5 (3) MG/3ML IN SOLN
3.0000 mL | Freq: Three times a day (TID) | RESPIRATORY_TRACT | Status: DC
  Filled 2015-04-08 (×12): qty 3

## 2015-04-08 MED ORDER — ALUM & MAG HYDROXIDE-SIMETH 200-200-20 MG/5ML PO SUSP
30.0000 mL | ORAL | Status: DC | PRN
Start: 2015-04-08 — End: 2015-04-11

## 2015-04-08 MED ORDER — HYDROXYZINE HCL 50 MG PO TABS
50.0000 mg | ORAL_TABLET | Freq: Once | ORAL | Status: AC
  Administered 2015-04-08: 50 mg via ORAL

## 2015-04-08 MED ORDER — ADULT MULTIVITAMIN W/MINERALS CH
1.0000 | ORAL_TABLET | Freq: Every day | ORAL | Status: DC
  Administered 2015-04-08 – 2015-04-10 (×3): 1 via ORAL
  Filled 2015-04-08 (×6): qty 1

## 2015-04-08 MED ORDER — FOLIC ACID 1 MG PO TABS
1.0000 mg | ORAL_TABLET | Freq: Every day | ORAL | Status: DC
  Administered 2015-04-08 – 2015-04-10 (×3): 1 mg via ORAL
  Filled 2015-04-08 (×3): qty 1
  Filled 2015-04-08: qty 7
  Filled 2015-04-08 (×3): qty 1

## 2015-04-08 MED ORDER — ALBUTEROL SULFATE (2.5 MG/3ML) 0.083% IN NEBU
2.5000 mg | INHALATION_SOLUTION | RESPIRATORY_TRACT | Status: DC | PRN
  Administered 2015-04-08: 2.5 mg via RESPIRATORY_TRACT
  Filled 2015-04-08: qty 3

## 2015-04-08 MED ORDER — INFLUENZA VAC SPLIT QUAD 0.5 ML IM SUSY
0.5000 mL | PREFILLED_SYRINGE | INTRAMUSCULAR | Status: AC
  Administered 2015-04-09: 0.5 mL via INTRAMUSCULAR
  Filled 2015-04-08: qty 0.5

## 2015-04-08 MED ORDER — LISINOPRIL 20 MG PO TABS
20.0000 mg | ORAL_TABLET | Freq: Every day | ORAL | Status: DC
  Administered 2015-04-08 – 2015-04-10 (×3): 20 mg via ORAL
  Filled 2015-04-08 (×2): qty 1
  Filled 2015-04-08: qty 7
  Filled 2015-04-08 (×4): qty 1

## 2015-04-08 MED ORDER — ESCITALOPRAM OXALATE 5 MG PO TABS
5.0000 mg | ORAL_TABLET | Freq: Every day | ORAL | Status: DC
  Administered 2015-04-08: 5 mg via ORAL
  Filled 2015-04-08 (×2): qty 1

## 2015-04-08 MED ORDER — ACETAMINOPHEN 325 MG PO TABS
650.0000 mg | ORAL_TABLET | Freq: Four times a day (QID) | ORAL | Status: DC | PRN
  Administered 2015-04-09 (×2): 650 mg via ORAL
  Filled 2015-04-08 (×2): qty 2

## 2015-04-08 MED ORDER — MAGNESIUM HYDROXIDE 400 MG/5ML PO SUSP: 30.0000 mL | Freq: Every day | ORAL | Status: DC | PRN

## 2015-04-08 MED ORDER — ESCITALOPRAM OXALATE 10 MG PO TABS
ORAL_TABLET | ORAL | Status: AC
  Filled 2015-04-08: qty 1

## 2015-04-08 MED ORDER — NICOTINE 14 MG/24HR TD PT24
14.0000 mg | MEDICATED_PATCH | Freq: Every day | TRANSDERMAL | Status: DC
  Administered 2015-04-08 – 2015-04-10 (×3): 14 mg via TRANSDERMAL
  Filled 2015-04-08 (×6): qty 1

## 2015-04-08 NOTE — Plan of Care (Signed)
Problem: Alteration in mood Goal: LTG-Patient reports reduction in suicidal thoughts (Patient reports reduction in suicidal thoughts and is able to verbalize a safety plan for whenever patient is feeling suicidal)  Outcome: Progressing Pt denies SI at this time     

## 2015-04-08 NOTE — Progress Notes (Signed)
Kayla Sandoval is very tearful throughout the entire admission process to the unit. She is endorsing hopelessness, sadness, depression, anxiety, insomnia, helplessness, loneliness and worrying. She denies SI/HI/AVH at this time. She has 2 living children, 1 son and 1 daughter. They are her support system. She reports financial stress as a major factor in her life right now. She does not have insurance or the finances to pay for any of her medications once she is discharged. She is still grieving the loss of her spouse (04-02-15).  She would like to work on her coping skills and get started on an antidepressant while she is here. She hopes to have grief counseling upon discharge as well. She endorses chronic headaches for which she takes OTC ibuprofen. She has been admitted to the unit and is currently resting in her bed. On call provider notified. Medications administered as prescribed. Continue Q 15 minute checks for patient safety and medication effectiveness.

## 2015-04-08 NOTE — Progress Notes (Signed)
D: Pt denies SI/HI/AVH. Pt is pleasant and cooperative. Pt is depressed and very tearful and emotional.   A: Pt was offered support and encouragement. Pt was given scheduled medications. Pt was encourage to attend groups. Q 15 minute checks were done for safety.   R:Pt attends groups and interacts well with peers and staff. Pt is taking medication. Pt receptive to treatment and safety maintained on unit.

## 2015-04-08 NOTE — Progress Notes (Signed)
Pt attended AA group this evening.  

## 2015-04-08 NOTE — BHH Group Notes (Signed)
[place] LCSW Group Therapy  04/08/2015   10:00 AM   Type of Therapy:  Group Therapy  Participation Level:  Active  Participation Quality:  Appropriate and Attentive  Affect:  Flat, depressed  Cognitive:  Alert and Appropriate  Insight:  Developing/Improving and Engaged  Engagement in Therapy:  Developing/Improving and Engaged  Modes of Intervention:  Clarification, Confrontation, Discussion, Education, Exploration, Limit-setting, Orientation, Problem-solving, Rapport Building, Art therapist, Socialization and Support  Summary of Progress/Problems: Today's group topic was avoiding self sabotage and enabling behaviors. Group members were asked to define self sabotage and enabling and provide examples. Group members were then asked to discuss unhealthy relationships and how to have positive healthy boundaries with those that enable. Group members were asked to process how communicating needs and establishing a plan to change the above identified behavior.  Pt was tearful throughout group today.  When called upon pt shared that she was home for 20 years and when her kids left the home she became bored and isolated to her room.  Pt states her husband passed away this past 07-Jun-2022 and he was her best friend.  Pt states she drank a 12 pack prior to admitting to the hospital and is about to lose everything - her home, her job and now her husband. Pt discussed being glad she was inpatient and has the support as she goes through this difficult time.  Pt actively participated and was engaged in group discussion.    Regan Lemming, LCSW 04/08/2015 12:37 PM

## 2015-04-08 NOTE — Tx Team (Signed)
Initial Interdisciplinary Treatment Plan   PATIENT STRESSORS: Financial difficulties Loss of spouse on 03-15-2015 Substance abuse Traumatic event   PATIENT STRENGTHS: General fund of knowledge Motivation for treatment/growth Supportive family/friends   PROBLEM LIST: Problem List/Patient Goals Date to be addressed Date deferred Reason deferred Estimated date of resolution  "I want help with my coping skills" 04-08-2015     "Getting on the right medicine for depression and anxiety" 04-08-2015     Depression 04-08-2015     Suicide Attempt 04-08-2015                                    DISCHARGE CRITERIA:  Ability to meet basic life and health needs Improved stabilization in mood, thinking, and/or behavior Safe-care adequate arrangements made Verbal commitment to aftercare and medication compliance  PRELIMINARY DISCHARGE PLAN: Attend aftercare/continuing care group Outpatient therapy Participate in family therapy  PATIENT/FAMIILY INVOLVEMENT: This treatment plan has been presented to and reviewed with the patient, Kayla Sandoval.  The patient and family have been given the opportunity to ask questions and make suggestions.  Gildardo Pounds 04/08/2015, 4:55 AM

## 2015-04-08 NOTE — BHH Counselor (Signed)
Adult Comprehensive Assessment  Patient ID: Vanshika A Pellot, female   DOB: 16-Jan-1971, 35 Y.Jenetta Downer   MRN: [id number]  Information Source: Information source: Patient  Current Stressors:  Educational / Learning stressors: NA Employment / Job issues: Unemployed Family Relationships: Death of husband April 17, 2015 Museum/gallery curator / Lack of resources (include bankruptcy): No income Housing / Lack of housing: No housing yet plans to live with mother Physical health (include injuries & life threatening diseases): NA Social relationships: Isolated other than family Substance abuse: Over drank day of husband's death Bereavement / Loss: Husband died Jun 03, 2022 04-17-15  Living/Environment/Situation:  Living Arrangements: Spouse/significant other Living conditions (as described by patient or guardian): Comfortable apartment of 17 years How long has patient lived in current situation?: 17 years What is atmosphere in current home: Comfortable, Quarry manager, Supportive  Family History:  Marital status: Widowed Widowed, when?: 03/02/15; was married for 23 years What is your sexual orientation?: Heterosexual Has your sexual activity been affected by drugs, alcohol, medication, or emotional stress?: no Does patient have children?: Yes How many children?: 2 How is patient's relationship with their children?: Good with both  Childhood History:  By whom was/is the patient raised?: Mother Additional childhood history information: Grew up with mother and one sister Description of patient's relationship with caregiver when they were a child: "Good" Patient's description of current relationship with people who raised him/her: "Good" How were you disciplined when you got in trouble as a child/adolescent?: Reprimanded; grounded; nothing major Does patient have siblings?: Yes Number of Siblings: 1 Description of patient's current relationship with siblings: Good yet she lives in [place] Did patient suffer any  verbal/emotional/physical/sexual abuse as a child?: No Did patient suffer from severe childhood neglect?: No Has patient ever been sexually abused/assaulted/raped as an adolescent or adult?: No Was the patient ever a victim of a crime or a disaster?: No Witnessed domestic violence?: No Has patient been effected by domestic violence as an adult?: No  Education:  Highest grade of school patient has completed: 27 Currently a Ship broker?: No Learning disability?: No  Employment/Work Situation:   Employment situation: Unemployed Patient's job has been impacted by current illness: No What is the longest time patient has a held a job?: 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 Has patient ever been in the [employer]?: No Are There Guns or Other Weapons in [place]?: No  Financial Resources:   Financial resources: No income Does patient have a Programmer, applications or guardian?: No  Alcohol/Substance Abuse:   What has been your use of drugs/alcohol within the last 12 months?: Patient usually will consume no more than 6 beers on a weekend in total and maybe have a joint every few months; did consume a 12 pack the day husband died If attempted suicide, did drugs/alcohol play a role in this?: Yes Alcohol/Substance Abuse Treatment Hx: Denies past history Has alcohol/substance abuse ever caused legal problems?: No  Social Support System:   Pensions consultant Support System: Fair Astronomer System: Friends yet 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 once husband lost his insurance and she did not have her psych medications Type of faith/religion: Darrick Meigs How does patient's faith help to cope with current illness?: NA at this time  Leisure/Recreation:   Leisure and Hobbies: "Gardening"  Strengths/Needs:   What things does the patient do well?: 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; Training and development officer; gardener In what areas does patient struggle / problems for patient: Loss of husband who died this week; no  financial resources  Discharge  Plan:   Does patient have access to transportation?: No Plan for no access to transportation at discharge: Mother will pick her up Will patient be returning to same living situation after discharge?: No Plan for living situation after discharge: Pt plans to stay with her mother or daughter Currently receiving community mental health services: No If no, would patient like referral for services when discharged?: Yes (What county?) [facility]) Does patient have financial barriers related to discharge medications?: Yes Patient description of barriers related to discharge medications: No income  Summary/Recommendations:   Summary and Recommendations (to be completed by the evaluator): Patient is 45 YO caucasian unemployed female who was admitted following uncharacteristically high alcohol ingestion and overdose on trazodone. Patient's stressors include husband's sudden death. and lack of psych medications for depression and anxiety over last two years due to loss of insurance. Patient will benefit from crisis stabilization, medication evaluation, group therapy and psycho education, in addition to case management for discharge planning. At discharge it is recommended that patient adhere to the established discharge plan and continue in treatment.   Lyla Glassing. 04/08/2015

## 2015-04-08 NOTE — Progress Notes (Signed)
D- Kayla Sandoval is out in the milieu accepting support from hall peers.  She is tearful and distraught.  She speaks openly about her husband's passing.  She denies overt discomfort.  States this is a first time psych admission.  Reports that trazedone OD was an "accident".  Denies SI.  A-  Support,encouragement, and prn medications offered.  Reenforced milieu schedule. POC continued with 15' checks.  R- Safety maintained.

## 2015-04-08 NOTE — H&P (Signed)
Psychiatric Admission Assessment Adult  Patient Identification: Kayla Sandoval MRN:  [id number] Date of Evaluation:  04/08/2015 Chief Complaint:  UNSPECIFIED DEPRESSION  ALCOHOL INTOXICATION  UNCOMPLICATED BEREAVEMENT  Principal Diagnosis: MDD (major depressive disorder), recurrent severe, without psychosis ([place]) Diagnosis:   Patient Active Problem List   Diagnosis Date Noted  . MDD (major depressive disorder), recurrent severe, without psychosis ([place]) [F33.2] 04/08/2015  . Acute stress disorder [F43.9] 04/08/2015  . Insomnia [G47.00] 04/08/2015  . Adjustment disorder with mixed anxiety and depressed mood [F43.23] 04/08/2015  . Recent bereavement [Z78.9] 04/07/2015  . Alcohol withdrawal ([place]) [F10.239] 04/06/2015  . CAP (community acquired pneumonia) [J18.9] 04/03/2015  . Overdose [T50.901A] 04/03/2015  . Acute encephalopathy [G93.40] 04/03/2015  . Acute respiratory failure with hypoxia ([place]) [J96.01] 04/03/2015  . Alcohol intoxication ([place]) [F10.129] 04/03/2015  . Tobacco use disorder [F17.200] 04/03/2015  . Hemothorax on left [J94.2] 04/09/2011  . Pleural effusion [J90] 03/27/2011  . Pneumonia [J18.9] 03/27/2011  . Tobacco abuse [Z72.0] 03/27/2011  . Hypertension [I10] 03/27/2011  . Macrocytosis without anemia [D75.89] 03/27/2011  . Depression [F32.9] 03/27/2011   History of Present Illness:: Kayla Sandoval is an 45 yr old female admitted to [facility] after an intentional overdose of Trazodone and found unresponsive by her daughter.  This happened say after the sudden death of her husband.  Patient states "I found my husband dead in the bed Apr 30, 2022 morning.  I started having a real bad panic attack.  I found 12 pk beer in frig drank it quick.  I started having these thoughts that if died we could be together;  but knew it was wrong.  I remeber seeing the pills in my hand; I don't remember taking them.  I thought it was a dream.  Next thing I knew my daughter found me unconscious  04/30/2022 night.  Patient also states referring to the overdose "No it wasn't intentional I don't believe in that."  Patient does state that she feel like her husbands death is her fault because they couldn't afford the sleep apnea machine.  "If I would have just pressured him a little bit more to go back to the doctor or to get some help to get the machine; maybe he would be dead now."  Patient states her reasons for not wanting to kill herself are her religious beliefs," God don't let suicidal victim in heaven so I wouldn't been able to meet if done that.  I have 2 children and I got to carry on for therm."    Patient reports that she has a history of depression and was prescribed Lexapro 5 years ago and it worked for her.  States that the Trazodone helped with her sleep but doesn't want to restart it would like something else for sleep.   Drug related disorders:  Denies  Legal History:Denies  Past Psychiatric History: Depression and Anxiety              Outpatient: None                Inpatient: No prior psych inpatient              Past medication trial:  Lexapro for depression (5 yrs ago), Lisinopril, Trazodone for Insomnia, Amitriptyline for Insomnia (didn't work)              Past SA: Denies a history of suicide attempts and states this incident was an accident thought she was dreaming  Psychological testing: None  Medical Problems:: HTN             Allergies:  None             Surgeries: States that she had to have her Lungs drained 2 years ago             Head trauma: None             STD:  None   Family Psychiatric history: Mother Anxiety/Depression   Family Medical History: Father lung cancer; Everyone in family has HTN  Associated Signs/Symptoms: Depression Symptoms:  depressed mood, feelings of worthlessness/guilt, hopelessness, anxiety, (Hypo) Manic Symptoms:  Distractibility, Impulsivity, Anxiety Symptoms:  Excessive Worry, Psychotic Symptoms:   Denies PTSD Symptoms: NA Total Time spent with patient: 1 hour  Past Psychiatric History: See above  Is the patient at risk to self? No.  Has the patient been a risk to self in the past 6 months? No.  Has the patient been a risk to self within the distant past? No.  Is the patient a risk to others? No.  Has the patient been a risk to others in the past 6 months? No.  Has the patient been a risk to others within the distant past? No.   Prior Inpatient Therapy:   Prior Outpatient Therapy:    Alcohol Screening: 1. How often do you have a drink containing alcohol?: Monthly or less 2. How many drinks containing alcohol do you have on a typical day when you are drinking?: 1 or 2 3. How often do you have six or more drinks on one occasion?: Never Preliminary Score: 0 4. How often during the last year have you found that you were not able to stop drinking once you had started?: Never 5. How often during the last year have you failed to do what was normally expected from you becasue of drinking?: Never 6. How often during the last year have you needed a first drink in the morning to get yourself going after a heavy drinking session?: Never 7. How often during the last year have you had a feeling of guilt of remorse after drinking?: Never 8. How often during the last year have you been unable to remember what happened the night before because you had been drinking?: Never 9. Have you or someone else been injured as a result of your drinking?: No 10. Has a relative or friend or a doctor or another health worker been concerned about your drinking or suggested you cut down?: No Alcohol Use Disorder Identification Test Final Score (AUDIT): 1 Brief Intervention: AUDIT score less than 7 or less-screening does not suggest unhealthy drinking-brief intervention not indicated Substance Abuse History in the last 12 months:  No. Consequences of Substance Abuse: NA Previous Psychotropic Medications: See  above Psychological Evaluations: None Past Medical History:  Past Medical History  Diagnosis Date  . Hypertension   . Depression     Past Surgical History  Procedure Laterality Date  . Chest thoracostomy     Family History: History reviewed. No pertinent family history. Family Psychiatric  History: See above Tobacco Screening: Everyday smoker Social History:  History  Alcohol Use  . 3.6 oz/week  . 6 Standard drinks or equivalent per week     History  Drug Use  . Yes  . Special: Marijuana    Additional Social History:      Pain Medications: pt denies abuse - see PTA meds list Prescriptions: pt denies abuse -  see PTA meds list Over the Counter: pt denies abuse - see PTA meds list History of alcohol / drug use?: Yes Name of Substance 1: alcohol 1 - Age of First Use: teenager 1 - Amount (size/oz): six 12-oz beers 1 - Frequency: on the weekends  1 - Duration: years 1 - Last Use / Amount: 04/02/15 - unknown amount of beer Name of Substance 2: marijuana 2 - Amount (size/oz): one joint  2 - Frequency: once every two mos 2 - Last Use / Amount: 04/02/15 - one joint  Allergies:  No Known Allergies Lab Results:  Results for orders placed or performed during the hospital encounter of 04/03/15 (from the past 48 hour(s))  Basic metabolic panel     Status: Abnormal   Collection Time: 04/07/15  5:35 AM  Result Value Ref Range   Sodium 139 135 - 145 mmol/L   Potassium 3.4 (L) 3.5 - 5.1 mmol/L   Chloride 108 101 - 111 mmol/L   CO2 22 22 - 32 mmol/L   Glucose, Bld 89 65 - 99 mg/dL   BUN 12 6 - 20 mg/dL   Creatinine, Ser 0.40 (L) 0.44 - 1.00 mg/dL   Calcium 9.1 8.9 - 10.3 mg/dL   GFR calc non Af Amer >60 >60 mL/min   GFR calc Af Amer >60 >60 mL/min    Comment: (NOTE) The eGFR has been calculated using the CKD EPI equation. This calculation has not been validated in all clinical situations. eGFR's persistently <60 mL/min signify possible Chronic Kidney Disease.    Anion gap  9 5 - 15    Blood Alcohol level:  Lab Results  Component Value Date   ETH 388* 04/03/2015   ETH * 05/20/2009    315        LOWEST DETECTABLE LIMIT FOR SERUM ALCOHOL IS 5 mg/dL FOR MEDICAL PURPOSES ONLY    Metabolic Disorder Labs:  No results found for: HGBA1C, MPG No results found for: PROLACTIN No results found for: CHOL, TRIG, HDL, CHOLHDL, VLDL, LDLCALC  Current Medications: Current Facility-Administered Medications  Medication Dose Route Frequency Provider Last Rate Last Dose  . acetaminophen (TYLENOL) tablet 650 mg  650 mg Oral Q6H PRN Harriet Butte, NP      . albuterol (PROVENTIL) (2.5 MG/3ML) 0.083% nebulizer solution 2.5 mg  2.5 mg Nebulization Q4H PRN Niel Hummer, NP      . alum & mag hydroxide-simeth (MAALOX/MYLANTA) 200-200-20 MG/5ML suspension 30 mL  30 mL Oral Q4H PRN Harriet Butte, NP      . escitalopram (LEXAPRO) tablet 5 mg  5 mg Oral QHS Shuvon B Rankin, NP      . folic acid (FOLVITE) tablet 1 mg  1 mg Oral Daily Niel Hummer, NP      . hydrOXYzine (ATARAX/VISTARIL) tablet 25 mg  25 mg Oral TID PRN Harriet Butte, NP   25 mg at 04/08/15 9892  . [START ON 04/09/2015] Influenza vac split quadrivalent PF (FLUARIX) injection 0.5 mL  0.5 mL Intramuscular Tomorrow-1000 Saramma Eappen, MD      . ipratropium-albuterol (DUONEB) 0.5-2.5 (3) MG/3ML nebulizer solution 3 mL  3 mL Nebulization TID Niel Hummer, NP      . lisinopril (PRINIVIL,ZESTRIL) tablet 20 mg  20 mg Oral Daily Niel Hummer, NP      . magnesium hydroxide (MILK OF MAGNESIA) suspension 30 mL  30 mL Oral Daily PRN Harriet Butte, NP      . multivitamin with minerals  tablet 1 tablet  1 tablet Oral Daily Niel Hummer, NP      . nicotine (NICODERM CQ - dosed in mg/24 hours) patch 14 mg  14 mg Transdermal Daily Niel Hummer, NP       PTA Medications: Prescriptions prior to admission  Medication Sig Dispense Refill Last Dose  . ibuprofen (ADVIL,MOTRIN) 600 MG tablet Take 600 mg by mouth every 6 (six)  hours as needed for moderate pain.   04/02/2015 at Unknown time    Musculoskeletal: Strength & Muscle Tone: within normal limits Gait & Station: normal Patient leans: N/A  Psychiatric Specialty Exam: Physical Exam  Constitutional: She is oriented to person, place, and time.  Neck: Normal range of motion.  Respiratory: Effort normal.  Musculoskeletal: Normal range of motion.  Neurological: She is alert and oriented to person, place, and time. Coordination and gait normal.  Skin: Skin is warm and dry.  Psychiatric: Her speech is normal and behavior is normal. Her mood appears anxious. Thought content is not paranoid and not delusional. Cognition and memory are normal. She expresses impulsivity. She exhibits a depressed mood. She expresses no homicidal and no suicidal ideation.    Review of Systems  HENT:       Recent episode of pneumonia   Respiratory: Positive for cough. Negative for hemoptysis, shortness of breath and wheezing.   Cardiovascular: Negative for chest pain.       History HTN  Psychiatric/Behavioral: Positive for depression. Suicidal ideas: Denies. Hallucinations: Denies. Substance abuse: Denies. The patient is nervous/anxious and has insomnia.     Blood pressure 113/89, pulse 109, temperature 98.6 F (37 C), temperature source Oral, resp. rate 16, height '5\' 4"'  (1.626 m), weight 56.7 kg (125 lb).Body mass index is 21.45 kg/(m^2).  General Appearance: Casual  Eye Contact::  Fair  Speech:  Clear and Coherent and Normal Rate  Volume:  Normal  Mood:  Anxious, Depressed and Hopeless  Affect:  Depressed, Flat and Tearful  Thought Process:  Circumstantial and Goal Directed  Orientation:  Full (Time, Place, and Person)  Thought Content:  Rumination and Denies hallucinations, delusions and paranoia  Suicidal Thoughts:  Denies; stating that was an accidential overdose.  Doesn't remember taking pills  Homicidal Thoughts:  No  Memory:  Immediate;   Fair Recent;   Fair Remote;    Good  Judgement:  Intact  Insight:  Fair and Present  Psychomotor Activity:  Normal  Concentration:  Fair  Recall:  Good  Fund of Knowledge:Good  Language: Good  Akathisia:  No  Handed:  Right  AIMS (if indicated):     Assets:  Communication Skills Desire for Improvement Housing Physical Health Social Support  ADL's:  Intact  Cognition: WNL  Sleep:  Number of Hours: 3     Treatment Plan Summary: Daily contact with patient to assess and evaluate symptoms and progress in treatment and Medication management 1. Kayla Sandoval was admitted to [facility] under the service of Dr. Sabra Heck for MDD (major depressive disorder), recurrent severe, without psychosis ([place]), crisis management, and stabilization. 2. Routine labs; which include CBC, CMP, UA, ETOH, and UDS were reviewed consultation for spiritual care also ordered.    3. During this hospital stay Kayla Sandoval will receive psychosocial and assessment.  A treatment plan developed to decrease risk of relapse upon discharge and the need for readmission.  Kayla Sandoval will participate in group therapy.  Psychotherapy:  Psychosocial education regarding relapse prevention and self  care; Social and Airline pilot; Learning based strategies; Cognitive behavioral; and family object relations individuation separation intervention psychotherapies can be considered.   4. Will maintain observation checks every 15 minutes for safety. 5. Due to acute depression/anxiety and long standing insomnia will start  medication management to reduce current symptoms to improve patient's overall level of functioning a trial of Lexapro for depression/anxiety/insomnia Vistaril for anxiety/insomnia was/will be suggested.  Home medications will be restarted where appropriate.   6. Kayla Sandoval was educated about medication efficacy and side effects.  Lexapro and Vistaril.  Kayla Sandoval voiced understanding and agreed to start trial of  medications Will Start Vistaril 25 mg Tid prn for anxiety/insomnia and Lexapro 5 mg Q hs for Major Depression; Insomnia/Anxiety.  Will Continue to monitor Kayla Sandoval for mood/behavioral; medication adverse reactions. 7. Health care follow ups to be scheduled when indicated for medical problems at discharge 8. Social work will consult with family for collateral information and discuss discharge and follow up plan.  Observation Level/Precautions:  15 minute checks for safety  Laboratory:  CBC Chemistry Profile UDS UA Reviewed Hospital labs  Psychotherapy:  Individual and group  Medications:  Will Lexapro 5 mg Q hs for Depression/Insomnia/Anxiety and Vistaril for Anxiety/Insomnia.  May consider Buspar for anxiety.   Consultations:  As appropriate  Discharge Concerns:  Safety, stabilization, and access to medication  Estimated LOS: 5-7 days  Other:     I certify that inpatient services furnished can reasonably be expected to improve the patient's condition.    Rankin, Delphia Grates, NP 2/25/201712:21 PM

## 2015-04-08 NOTE — [facility] Suicide Risk Assessment (Signed)
[facility] Admission Suicide Risk Assessment   Nursing information obtained from:  Patient Demographic factors:  Divorced or widowed, Caucasian, Low socioeconomic status, Living alone, Unemployed Current Mental Status:  Trazodone OD with alcohol intoxication after finding husband dead last 2022/05/26 Loss Factors:  Loss of significant relationship, Financial problems / change in socioeconomic status Historical Factors:  Prior suicide attempts, family hx of mental illness Risk Reduction Factors:  Positive social support  Total Time spent with patient: 1 hour Principal Problem: MDD (major depressive disorder), recurrent severe, without psychosis ([place]) Diagnosis:   Patient Active Problem List   Diagnosis Date Noted  . MDD (major depressive disorder), recurrent severe, without psychosis ([place]) [F33.2] 04/08/2015  . Recent bereavement [Z78.9] 04/07/2015  . Alcohol withdrawal ([place]) [F10.239] 04/06/2015  . CAP (community acquired pneumonia) [J18.9] 04/03/2015  . Overdose [T50.901A] 04/03/2015  . Acute encephalopathy [G93.40] 04/03/2015  . Acute respiratory failure with hypoxia ([place]) [J96.01] 04/03/2015  . Alcohol intoxication ([place]) [F10.129] 04/03/2015  . Tobacco use disorder [F17.200] 04/03/2015  . Hemothorax on left [J94.2] 04/09/2011  . Pleural effusion [J90] 03/27/2011  . Pneumonia [J18.9] 03/27/2011  . Tobacco abuse [Z72.0] 03/27/2011  . Hypertension [I10] 03/27/2011  . Macrocytosis without anemia [D75.89] 03/27/2011  . Depression [F32.9] 03/27/2011   Subjective Data: Pt reports her husband died suddenly on 2022-05-26. She is very depressed and drank and OD on Trazodone. Pt was not aware of her actions and regrets it. She is overwhelmed. Denies HI/AVH. She is having insomnia. IN the past Lexapro has helped.   Continued Clinical Symptoms:  Alcohol Use Disorder Identification Test Final Score (AUDIT): 1 The "Alcohol Use Disorders Identification Test", Guidelines for Use in Primary Care, Second  Edition.  World Pharmacologist [facility]). Score between 0-7:  no or low risk or alcohol related problems. Score between 8-15:  moderate risk of alcohol related problems. Score between 16-19:  high risk of alcohol related problems. Score 20 or above:  warrants further diagnostic evaluation for alcohol dependence and treatment.   CLINICAL FACTORS:   Depression:   Anhedonia Hopelessness Impulsivity Severe Alcohol/Substance Abuse/Dependencies   Musculoskeletal: Strength & Muscle Tone: within normal limits Gait & Station: normal Patient leans: straight  Psychiatric Specialty Exam: Review of Systems  Psychiatric/Behavioral: Positive for depression and substance abuse. The patient has insomnia.     Blood pressure 113/89, pulse 109, temperature 98.6 F (37 C), temperature source Oral, resp. rate 16, height 5\' 4"  (1.626 m), weight 56.7 kg (125 lb).Body mass index is 21.45 kg/(m^2).  General Appearance: Casual  Eye Contact::  Good  Speech:  Clear and Coherent and Normal Rate  Volume:  Normal  Mood:  Anxious and Depressed  Affect:  Depressed and Tearful  Thought Process:  Circumstantial  Orientation:  Full (Time, Place, and Person)  Thought Content:  Negative  Suicidal Thoughts:  No  Homicidal Thoughts:  No  Memory:  Immediate;   Fair Recent;   Fair Remote;   Fair  Judgement:  Poor  Insight:  Fair  Psychomotor Activity:  Normal  Concentration:  Good  Recall:  Good  Fund of Knowledge:Good  Language: Good  Akathisia:  No  Handed:  Right  AIMS (if indicated):     Assets:  Communication Skills Desire for Improvement  Sleep:  Number of Hours: 3  Cognition: WNL  ADL's:  Intact    COGNITIVE FEATURES THAT CONTRIBUTE TO RISK:  Closed-mindedness, Polarized thinking and Thought constriction (tunnel vision)    SUICIDE RISK:   Extreme:  Frequent, intense, and  enduring suicidal ideation, specific plans, clear subjective and objective intent, impaired self-control, severe  dysphoria/symptomatology, many risk factors and no protective factors.  PLAN OF CARE: admit to inpt psych. See H&P.   I certify that inpatient services furnished can reasonably be expected to improve the patient's condition.   Charlcie Cradle, MD 04/08/2015, 8:59 AM

## 2015-04-09 DIAGNOSIS — F4321 Adjustment disorder with depressed mood: Secondary | ICD-10-CM | POA: Insufficient documentation

## 2015-04-09 LAB — CULTURE, BLOOD (ROUTINE X 2)
Culture: NO GROWTH
Culture: NO GROWTH

## 2015-04-09 MED ORDER — ESCITALOPRAM OXALATE 5 MG PO TABS: 5.0000 mg | ORAL_TABLET | Freq: Every day | ORAL | Status: DC

## 2015-04-09 MED ORDER — ESCITALOPRAM OXALATE 10 MG PO TABS: 10.0000 mg | ORAL_TABLET | Freq: Every day | ORAL | Status: DC

## 2015-04-09 MED ORDER — ESCITALOPRAM OXALATE 10 MG PO TABS
10.0000 mg | ORAL_TABLET | Freq: Every day | ORAL | Status: DC
  Administered 2015-04-09 – 2015-04-10 (×2): 10 mg via ORAL
  Filled 2015-04-09: qty 1
  Filled 2015-04-09: qty 7
  Filled 2015-04-09 (×4): qty 1

## 2015-04-09 MED ORDER — TRAZODONE HCL 50 MG PO TABS
50.0000 mg | ORAL_TABLET | Freq: Every evening | ORAL | Status: DC | PRN
  Administered 2015-04-09: 50 mg via ORAL
  Filled 2015-04-09: qty 14
  Filled 2015-04-09: qty 1

## 2015-04-09 NOTE — Plan of Care (Signed)
Problem: Alteration in mood Goal: LTG-Patient reports reduction in suicidal thoughts (Patient reports reduction in suicidal thoughts and is able to verbalize a safety plan for whenever patient is feeling suicidal)  Outcome: Progressing Pt denies SI at this time     

## 2015-04-09 NOTE — Progress Notes (Signed)
[facility] MD Progress Note  04/09/2015 12:34 PM Berdella TONIA HABECKER  MRN:  [id number]   Subjective:  "I can't take Lexapro at night it keeps me up" Patient seems by this provider, case reviewed with social worker and nursing.  On evaluation:  Jenaya A Blower reports that the Lexapro kept her up last night "I can't take it at night"  Patient states that she is feeling better except for knowing that she is going to be alone for the rest of her life.  Reports that she is tolerating her medications without adverse reaction except for not being able to sleep last night related to the Lexapro.  States that she is attending/participating in group sessions and eating without difficulty.  Continues to endorse depression and anxiety.  At this time denies suicidal/homicidal ideation, psychosis, and paranoia   Principal Problem: MDD (major depressive disorder), recurrent severe, without psychosis ([place]) Diagnosis:   Patient Active Problem List   Diagnosis Date Noted  . MDD (major depressive disorder), recurrent severe, without psychosis ([place]) [F33.2] 04/08/2015  . Acute stress disorder [F43.9] 04/08/2015  . Insomnia [G47.00] 04/08/2015  . Adjustment disorder with mixed anxiety and depressed mood [F43.23] 04/08/2015  . Recent bereavement [Z78.9] 04/07/2015  . Alcohol withdrawal ([place]) [F10.239] 04/06/2015  . CAP (community acquired pneumonia) [J18.9] 04/03/2015  . Overdose [T50.901A] 04/03/2015  . Acute encephalopathy [G93.40] 04/03/2015  . Acute respiratory failure with hypoxia ([place]) [J96.01] 04/03/2015  . Alcohol intoxication ([place]) [F10.129] 04/03/2015  . Tobacco use disorder [F17.200] 04/03/2015  . Hemothorax on left [J94.2] 04/09/2011  . Pleural effusion [J90] 03/27/2011  . Pneumonia [J18.9] 03/27/2011  . Tobacco abuse [Z72.0] 03/27/2011  . Hypertension [I10] 03/27/2011  . Macrocytosis without anemia [D75.89] 03/27/2011  . Depression [F32.9] 03/27/2011   Total Time spent with patient: 30 minutes  Past  Psychiatric History: Depression and Anxiety; no out pt; no prior in pt;  Past medication trial: Lexapro for depression (5 yrs ago), Lisinopril, Trazodone for Insomnia, Amitriptyline for Insomnia (didn't work)  Past SA: Denies a history of suicide attempts and states this incident was an accident thought she was dreaming  Past Medical History:  Past Medical History  Diagnosis Date  . Hypertension   . Depression     Past Surgical History  Procedure Laterality Date  . Chest thoracostomy     Family History: History reviewed. No pertinent family history. Family Psychiatric  History: Mother Anxiety/Depression Social History:  History  Alcohol Use  . 3.6 oz/week  . 6 Standard drinks or equivalent per week     History  Drug Use  . Yes  . Special: Marijuana    Social History   Social History  . Marital Status: Married    Spouse Name: N/A  . Number of Children: N/A  . Years of Education: N/A   Social History Main Topics  . Smoking status: Current Every Day Smoker -- 1.00 packs/day for 10 years    Types: Cigarettes    Last Attempt to Quit: 03/15/2011  . Smokeless tobacco: None  . Alcohol Use: 3.6 oz/week    6 Standard drinks or equivalent per week  . Drug Use: Yes    Special: Marijuana  . Sexual Activity: Yes   Other Topics Concern  . None   Social History Narrative   Smoked "a few cigarettes a day" for many years up until 02/2011. Drinks "a few" drinks of alcohol on weekend, never until drunk or passing out.  Denies illicit drug use.  Stay at home  mom.   Additional Social History:    Pain Medications: pt denies abuse - see PTA meds list Prescriptions: pt denies abuse - see PTA meds list Over the Counter: pt denies abuse - see PTA meds list History of alcohol / drug use?: Yes Name of Substance 1: alcohol 1 - Age of First Use: teenager 1 - Amount (size/oz): six 12-oz beers 1 - Frequency: on the weekends  1 - Duration: years 1 - Last Use /  Amount: 04/02/15 - unknown amount of beer Name of Substance 2: marijuana 2 - Amount (size/oz): one joint  2 - Frequency: once every two mos 2 - Last Use / Amount: 04/02/15 - one joint                Sleep: Fair  Appetite:  Good  Current Medications: Current Facility-Administered Medications  Medication Dose Route Frequency Provider Last Rate Last Dose  . acetaminophen (TYLENOL) tablet 650 mg  650 mg Oral Q6H PRN Harriet Butte, NP   650 mg at 04/09/15 1121  . albuterol (PROVENTIL) (2.5 MG/3ML) 0.083% nebulizer solution 2.5 mg  2.5 mg Nebulization Q4H PRN Niel Hummer, NP   2.5 mg at 04/08/15 1425  . alum & mag hydroxide-simeth (MAALOX/MYLANTA) 200-200-20 MG/5ML suspension 30 mL  30 mL Oral Q4H PRN Harriet Butte, NP      . escitalopram (LEXAPRO) tablet 10 mg  10 mg Oral Daily Daouda Lonzo B Waris Rodger, NP   10 mg at 04/09/15 1002  . folic acid (FOLVITE) tablet 1 mg  1 mg Oral Daily Niel Hummer, NP   1 mg at 04/09/15 [id number]  . hydrOXYzine (ATARAX/VISTARIL) tablet 25 mg  25 mg Oral TID PRN Harriet Butte, NP   25 mg at 04/09/15 [id number]  . ipratropium-albuterol (DUONEB) 0.5-2.5 (3) MG/3ML nebulizer solution 3 mL  3 mL Nebulization TID Niel Hummer, NP   3 mL at 04/08/15 1730  . lisinopril (PRINIVIL,ZESTRIL) tablet 20 mg  20 mg Oral Daily Niel Hummer, NP   20 mg at 04/09/15 [phone]  . magnesium hydroxide (MILK OF MAGNESIA) suspension 30 mL  30 mL Oral Daily PRN Harriet Butte, NP      . multivitamin with minerals tablet 1 tablet  1 tablet Oral Daily Niel Hummer, NP   1 tablet at 04/09/15 [phone]  . nicotine (NICODERM CQ - dosed in mg/24 hours) patch 14 mg  14 mg Transdermal Daily Niel Hummer, NP   14 mg at 04/09/15 [id number]    Lab Results: No results found for this or any previous visit (from the past 48 hour(s)).  Blood Alcohol level:  Lab Results  Component Value Date   ETH 388* 04/03/2015   ETH * 05/20/2009    315        LOWEST DETECTABLE LIMIT FOR SERUM ALCOHOL IS 5 mg/dL FOR MEDICAL  PURPOSES ONLY    Physical Findings: AIMS: Facial and Oral Movements Muscles of Facial Expression: None, normal Lips and Perioral Area: None, normal Jaw: None, normal Tongue: None, normal,Extremity Movements Upper (arms, wrists, hands, fingers): None, normal Lower (legs, knees, ankles, toes): None, normal, Trunk Movements Neck, shoulders, hips: None, normal, Overall Severity Severity of abnormal movements (highest score from questions above): None, normal Incapacitation due to abnormal movements: None, normal Patient's awareness of abnormal movements (rate only patient's report): No Awareness, Dental Status Current problems with teeth and/or dentures?: Yes (multiple cavities) Does patient usually wear dentures?: No  CIWA:  CIWA-Ar Total: 7  COWS:  COWS Total Score: 3  Musculoskeletal: Strength & Muscle Tone: within normal limits Gait & Station: normal Patient leans: N/A  Psychiatric Specialty Exam: Review of Systems  Psychiatric/Behavioral: Positive for depression and substance abuse. Negative for hallucinations. Suicidal ideas: Denies. The patient is nervous/anxious and has insomnia.   All other systems reviewed and are negative.   Blood pressure 115/83, pulse 105, temperature 98.6 F (37 C), temperature source Oral, resp. rate 16, height 5\' 4"  (1.626 m), weight 56.7 kg (125 lb).Body mass index is 21.45 kg/(m^2).  General Appearance: Casual and Fairly Groomed  Eye Contact::  Good  Speech:  Clear and Coherent and Normal Rate  Volume:  Normal  Mood:  Anxious, Depressed and Hopeless  Affect:  Depressed, Flat and Tearful  Thought Process:  Circumstantial and Goal Directed  Orientation:  Full (Time, Place, and Person)  Thought Content:  Rumination  Suicidal Thoughts:  Denies at this time  Homicidal Thoughts:  No  Memory:  Immediate;   Good Recent;   Good Remote;   Good  Judgement:  Fair  Insight:  Fair  Psychomotor Activity:  Normal  Concentration:  Fair  Recall:  Good   Fund of Knowledge:Good  Language: Good  Akathisia:  No  Handed:  Right  AIMS (if indicated):     Assets:  Communication Skills Desire for Improvement Housing Social Support  ADL's:  Intact  Cognition: WNL  Sleep:  Number of Hours: 2.25   Treatment Plan Summary: Daily contact with patient to assess and evaluate symptoms and progress in treatment and Medication management   Continue supportive approach/coping skills; encourage group sessions Medication management: Major Depression; Increased Lexapro 10 mg daily.  Also changed for medication to be taken in the morning instead of night General Anxiety: Changed Vistaril to 25 mg Bid prn  Insomnia; Continue Vistaril 50 mg Q hs prn Grieving: Still tearful and up set related to the sudden death of husband.  Order for Spiritual consult.  States she has not seen patient since she has been admitted to [place] home medications Work with CBT/mindfulness Continue to explore residential treatment options  Nivia Gervase, NP 04/09/2015, 12:34 PM

## 2015-04-09 NOTE — [facility] Suicide Risk Assessment (Signed)
[facility] INPATIENT:  Family/Significant Other Suicide Prevention Education  Suicide Prevention Education:  Education Completed; Patient's daughter Kayla Sandoval at [phone] has been identified by the patient as the family member/significant other with whom the patient will be residing, and identified as the person(s) who will aid the patient in the event of a mental health crisis (suicidal ideations/suicide attempt).  With written consent from the patient, the family member/significant other has been provided the following suicide prevention education, prior to the and/or following the discharge of the patient.  The suicide prevention education provided includes the following:  Suicide risk factors  Suicide prevention and interventions  National Suicide Hotline telephone number  [facility] assessment telephone number  [facility] Emergency Assistance [place] and/or Residential Mobile Crisis Unit telephone number  Request made of family/significant other to:  Remove weapons (e.g., guns, rifles, knives), all items previously/currently identified as safety concern.    Remove drugs/medications (over-the-counter, prescriptions, illicit drugs), all items previously/currently identified as a safety concern.  The family member/significant other verbalizes understanding of the suicide prevention education information provided.  The family member/significant other agrees to remove the items of safety concern listed above.  Kayla Sandoval 04/09/2015, 5:08 PM

## 2015-04-09 NOTE — Progress Notes (Signed)
Adult Psychoeducational Group Note  Date: 04/08/2015 Time: 1:15pm   Group Topic/Focus:  Healthy Communication:   The focus of this group is to discuss communication, barriers to communication, as well as healthy ways to communicate with others.  Participation Level:  Active  Participation Quality:  Appropriate  Affect:  Appropriate  Cognitive:  Alert and Oriented  Insight: Appropriate  Engagement in Group:  Engaged  Modes of Intervention:  Activity, Discussion, Education and Support  Additional Comments:

## 2015-04-09 NOTE — [facility] Group Notes (Signed)
[facility] LCSW Group Therapy  04/09/2015   10:00 AM   Type of Therapy:  Group Therapy  Participation Level:  Active  Participation Quality:  Appropriate and Attentive  Affect:  Flat, Depressed  Cognitive:  Alert and Appropriate  Insight:  Developing/Improving and Engaged  Engagement in Therapy:  Developing/Improving and Engaged  Modes of Intervention:  Clarification, Confrontation, Discussion, Education, Exploration, Limit-setting, Orientation, Problem-solving, Rapport Building, Art therapist, Socialization and Support  Summary of Progress/Problems: The main focus of today's process group was to identify the patient's current support system and decide on other supports that can be put in place.  An emphasis was placed on using counselor, doctor, therapy groups, 12-step groups, and problem-specific support groups to expand supports, as well as doing something different than has been done before. Pt appeared attentive to group discussion but did not share anything.  Pt was observed crying at times during group.    Regan Lemming, LCSW 04/09/2015 1:48 PM

## 2015-04-09 NOTE — Progress Notes (Signed)
Pt stated the lexapro at night was keeping her up and not helping her sleep

## 2015-04-09 NOTE — Progress Notes (Signed)
Pt attended AA group this evening.  

## 2015-04-09 NOTE — Progress Notes (Signed)
D: Pt denies SI/HI/AVH. Pt is pleasant and cooperative. Pt emotional about anniversary of husband death.   A: Pt was offered support and encouragement. Pt was given scheduled medications. Pt was encourage to attend groups. Q 15 minute checks were done for safety.   R:Pt attends groups and interacts well with peers and staff. Pt is taking medication. Pt has no complaints at this time.Pt receptive to treatment and safety maintained on unit.

## 2015-04-09 NOTE — Progress Notes (Signed)
D: Pt presents with flat affect and depressed mood. Pt tearful and sad on approach. Pt rates depression 7/10. Hopeless 3/10. Anxiety 8/10. Pt denies suicidal thoughts. Pt reported to Probation officer that today is the anniversary of finding her husband dead. Pt reported that she's having a difficult time this morning processing her emotions. Pt requested to have Lexapro changed to morning time. Writer informed NP of pt request. Pt started on Lexapro 10 mg daily in the am. Morning dose of Lexapro given this morning.  A: Medications administered as ordered per MD. Medications reviewed with pt. Verbal support provided. Pt encouraged to attend groups. Pt encouraged to talk to writer if symptoms worsen. 15 minute checks performed for safety. R: Pt stated goal "trying to have a positive nite day and a good nite sleep". Pt receptive to tx.

## 2015-04-10 MED ORDER — ALBUTEROL SULFATE (2.5 MG/3ML) 0.083% IN NEBU: 2.5000 mg | INHALATION_SOLUTION | RESPIRATORY_TRACT | Status: DC | PRN

## 2015-04-10 MED ORDER — HYDROXYZINE HCL 25 MG PO TABS: 25.0000 mg | ORAL_TABLET | Freq: Three times a day (TID) | ORAL | Status: DC | PRN

## 2015-04-10 MED ORDER — FOLIC ACID 1 MG PO TABS: 1.0000 mg | ORAL_TABLET | Freq: Every day | ORAL | Status: DC

## 2015-04-10 MED ORDER — TRAZODONE HCL 50 MG PO TABS: 50.0000 mg | ORAL_TABLET | Freq: Every evening | ORAL | Status: DC | PRN

## 2015-04-10 MED ORDER — LISINOPRIL 20 MG PO TABS: 20.0000 mg | ORAL_TABLET | Freq: Every day | ORAL | Status: DC

## 2015-04-10 MED ORDER — ESCITALOPRAM OXALATE 10 MG PO TABS: 10.0000 mg | ORAL_TABLET | Freq: Every day | ORAL | Status: DC

## 2015-04-10 MED ORDER — NICOTINE 14 MG/24HR TD PT24: 14.0000 mg | MEDICATED_PATCH | Freq: Every day | TRANSDERMAL | Status: DC

## 2015-04-10 NOTE — Progress Notes (Signed)
Discharge note: Pt received both written and verbal discharge instructions. Pt verbalized understanding of discharge instructions. Pt agreed to f/u appt and med regimen. Pt received sample meds, prescriptions, AVS, SRA, Trans record and belongings. Pt safely discharged to lobby.

## 2015-04-10 NOTE — Discharge Summary (Signed)
Physician Discharge Summary Note  Patient:  Kayla Sandoval is an 45 y.o., female MRN:  [id number] DOB:  1970/10/29 Patient phone:  [phone] (home)  Patient address:   [address]  [place] [address],  Total Time spent with patient: Greater than 30 minutes  Date of Admission:  04/08/2015  Date of Discharge: 04-10-15  Reason for Admission: Suicide attempt by overdose  Principal Problem: MDD (major depressive disorder), recurrent severe, without psychosis ([place]), Acute stress syndrome.  Discharge Diagnoses: Patient Active Problem List   Diagnosis Date Noted  . Feeling grief [F43.21]   . MDD (major depressive disorder), recurrent severe, without psychosis ([place]) [F33.2] 04/08/2015  . Acute stress disorder [F43.9] 04/08/2015  . Insomnia [G47.00] 04/08/2015  . Adjustment disorder with mixed anxiety and depressed mood [F43.23] 04/08/2015  . Recent bereavement [Z78.9] 04/07/2015  . Alcohol withdrawal ([place]) [F10.239] 04/06/2015  . CAP (community acquired pneumonia) [J18.9] 04/03/2015  . Overdose [T50.901A] 04/03/2015  . Acute encephalopathy [G93.40] 04/03/2015  . Acute respiratory failure with hypoxia ([place]) [J96.01] 04/03/2015  . Alcohol intoxication ([place]) [F10.129] 04/03/2015  . Tobacco use disorder [F17.200] 04/03/2015  . Hemothorax on left [J94.2] 04/09/2011  . Pleural effusion [J90] 03/27/2011  . Pneumonia [J18.9] 03/27/2011  . Tobacco abuse [Z72.0] 03/27/2011  . Hypertension [I10] 03/27/2011  . Macrocytosis without anemia [D75.89] 03/27/2011  . Depression [F32.9] 03/27/2011   Past Psychiatric History: Major depression  Past Medical History:  Past Medical History  Diagnosis Date  . Hypertension   . Depression     Past Surgical History  Procedure Laterality Date  . Chest thoracostomy     Family History: History reviewed. No pertinent family history.  Family Psychiatric  History: See H&P  Social History:  History  Alcohol Use  . 3.6 oz/week  . 6 Standard drinks  or equivalent per week     History  Drug Use  . Yes  . Special: Marijuana    Social History   Social History  . Marital Status: Married    Spouse Name: N/A  . Number of Children: N/A  . Years of Education: N/A   Social History Main Topics  . Smoking status: Current Every Day Smoker -- 1.00 packs/day for 10 years    Types: Cigarettes    Last Attempt to Quit: 03/15/2011  . Smokeless tobacco: None  . Alcohol Use: 3.6 oz/week    6 Standard drinks or equivalent per week  . Drug Use: Yes    Special: Marijuana  . Sexual Activity: Yes   Other Topics Concern  . None   Social History Narrative   Smoked "a few cigarettes a day" for many years up until 02/2011. Drinks "a few" drinks of alcohol on weekend, never until drunk or passing out.  Denies illicit drug use.  Stay at home mom.   Hospital Course: Kayla Sandoval is an 45 yr old female admitted to [facility] after an intentional overdose of Trazodone and found unresponsive by her daughter. This happened say after the sudden death of her husband. Patient states "I found my husband dead in the bed May 21, 2022 morning. I started having a real bad panic attack. I found 12 pk beer in frig drank it quick. I started having these thoughts that if died we could be together; but knew it was wrong. I remember seeing the pills in my hand; I don't remember taking them. I thought it was a dream. Next thing I knew my daughter found me unconscious 05-21-22 night. Patient also states  referring to the overdose "No it wasn't intentional I don't believe in that." Patient does state that she feel like her husbands death is her fault because they couldn't afford the sleep apnea machine.  Kayla Sandoval was admitted to the [facility] after an intentional overdose on Trazodone & 12 packs of beer. She was found unconscious, hospitalized at the [facility]. This happened after her husband suffered a sudden death. Her alcohol level on arrival to the [facility] ED was 388. After medical clearance 5 days at the [facility], Kayla Sandoval was transferred to the [facility] hospital for psychiatric evaluation/ treatment. She was no longer intoxicated or required detoxification on arrival to [facility] adult unit. However, she was in need & received mood stabilization treatments.  During the course of her hospital stay, Kayla Sandoval was medicated & discharged on; Lexapro 10 mg for depression, Hydroxyzine 25 mg for anxiety & Trazodone 50 mg for insomnia. She received other medication regimen for her other medical issues that she presented. She tolerated her treatment regimen without any adverse effects or reactions reported. She was enrolled & participated in the group counseling sessions being offered & held on this unit. She learned coping skills that should help her cope better after discharge.  Kayla Sandoval's symptoms responded well to her treatment regimen. This is evidenced by her reports of improved mood & absence of suicidal ideations/substance withdrawal symptoms. She is currently being discharged to continue psychiatric treatment & medication management as noted below. Kayla Sandoval is provided with all the necessary information needed to make this appointment without problems. Upon discharge, she adamantly denies any SIHI, AVH, delusional thoughts, paranoia or substance withdrawal symptoms. She was provided with a 7 days worth, supply samples of her [facility] discharge medications. Kayla Sandoval left [place] with all personal belongings in no apparent distress. Transportation per daughter.  Physical Findings: AIMS: Facial and Oral Movements Muscles of Facial Expression: None, normal Lips and Perioral Area: None, normal Jaw: None, normal Tongue: None, normal,Extremity Movements Upper (arms, wrists, hands, fingers): None, normal Lower (legs, knees, ankles, toes): None, normal, Trunk Movements Neck, shoulders, hips: None, normal, Overall Severity Severity of abnormal movements (highest score from  questions above): None, normal Incapacitation due to abnormal movements: None, normal Patient's awareness of abnormal movements (rate only patient's report): No Awareness, Dental Status Current problems with teeth and/or dentures?: Yes (multiple cavities) Does patient usually wear dentures?: No  CIWA:  CIWA-Ar Total: 7 COWS:  COWS Total Score: 3  Musculoskeletal: Strength & Muscle Tone: within normal limits Gait & Station: normal Patient leans: N/A  Psychiatric Specialty Exam: Review of Systems  Constitutional: Negative.   HENT: Negative.   Eyes: Negative.   Respiratory: Negative.   Cardiovascular: Negative.   Gastrointestinal: Negative.   Genitourinary: Negative.   Musculoskeletal: Negative.   Skin: Negative.   Neurological: Negative.   Endo/Heme/Allergies: Negative.   Psychiatric/Behavioral: Positive for depression (Stable). Negative for suicidal ideas, hallucinations, memory loss and substance abuse. The patient has insomnia (Stable). The patient is not nervous/anxious.     Blood pressure 118/79, pulse 91, temperature 98.5 F (36.9 C), temperature source Oral, resp. rate 18, height 5\' 4"  (1.626 m), weight 56.7 kg (125 lb).Body mass index is 21.45 kg/(m^2).  See Md's SRA  Have you used any form of tobacco in the last 30 days? (Cigarettes, Smokeless Tobacco, Cigars, and/or Pipes): Yes  Has this patient used any form of tobacco in the last 30 days? (Cigarettes, Smokeless Tobacco, Cigars, and/or Pipes): Yes, A prescription for an  FDA-approved tobacco cessation medication was offered at discharge and the patient refused  Blood Alcohol level:  Lab Results  Component Value Date   ETH 388* 04/03/2015   ETH * 05/20/2009    315        LOWEST DETECTABLE LIMIT FOR SERUM ALCOHOL IS 5 mg/dL FOR MEDICAL PURPOSES ONLY   Metabolic Disorder Labs:  No results found for: HGBA1C, MPG No results found for: PROLACTIN No results found for: CHOL, TRIG, HDL, CHOLHDL, VLDL, LDLCALC  See  Psychiatric Specialty Exam and Suicide Risk Assessment completed by Attending Physician prior to discharge.  Discharge destination:  Home  Is patient on multiple antipsychotic therapies at discharge:  No   Has Patient had three or more failed trials of antipsychotic monotherapy by history:  No  Recommended Plan for Multiple Antipsychotic Therapies: NA    Medication List    STOP taking these medications        diphenhydrAMINE 25 mg capsule  Commonly known as:  BENADRYL     ibuprofen 600 MG tablet  Commonly known as:  ADVIL,MOTRIN      TAKE these medications      Indication   albuterol (2.5 MG/3ML) 0.083% nebulizer solution  Commonly known as:  PROVENTIL  Take 3 mLs (2.5 mg total) by nebulization every 4 (four) hours as needed for wheezing or shortness of breath.   Indication:  Acute Bronchospasm     escitalopram 10 MG tablet  Commonly known as:  LEXAPRO  Take 1 tablet (10 mg total) by mouth daily. For depression   Indication:  Generalized Anxiety Disorder, Major Depressive Disorder     folic acid 1 MG tablet  Commonly known as:  FOLVITE  Take 1 tablet (1 mg total) by mouth daily. (May buy from over the counter at yr local pharmacy): For low Folate   Indication:  Deficiency of Folic Acid in the Diet     hydrOXYzine 25 MG tablet  Commonly known as:  ATARAX/VISTARIL  Take 1 tablet (25 mg total) by mouth 3 (three) times daily as needed for anxiety (sleep).   Indication:  Anxiety/insomnia     lisinopril 20 MG tablet  Commonly known as:  PRINIVIL,ZESTRIL  Take 1 tablet (20 mg total) by mouth daily. For high blood pressure   Indication:  High Blood Pressure     nicotine 14 mg/24hr patch  Commonly known as:  NICODERM CQ - dosed in mg/24 hours  Place 1 patch (14 mg total) onto the skin daily. For smoking cessation   Indication:  Nicotine Addiction     traZODone 50 MG tablet  Commonly known as:  DESYREL  Take 1 tablet (50 mg total) by mouth at bedtime as needed and may  repeat dose one time if needed for sleep.   Indication:  Trouble Sleeping       Follow-up Information    Follow up with Tamela Gammon On 04/12/2015.   Why:  Walk in at 9:00AM for assessment of mental health services. Please bring Photo ID/Social Security Card/Proof of income if you have it.    Contact information:   [place] [place], [place] [address] Phone: [phone] Fax: [phone]     Follow-up recommendations: Activity:  As tolerated Diet: As recommended by your primary care doctor. Keep all scheduled follow-up appointments as recommended.    Comments: Take all your medications as prescribed by your mental healthcare provider. Report any adverse effects and or reactions from your medicines to your outpatient provider promptly. Patient is  instructed and cautioned to not engage in alcohol and or illegal drug use while on prescription medicines. In the event of worsening symptoms, patient is instructed to call the crisis hotline, 911 and or go to the nearest ED for appropriate evaluation and treatment of symptoms. Follow-up with your primary care provider for your other medical issues, concerns and or health care needs.   Signed: Encarnacion Slates, NP, PMHNP, FNP-BC 04/10/2015, 3:29 PM  I personally assessed the patient and formulated the plan Geralyn Flash A. Sabra Heck, M.D.

## 2015-04-10 NOTE — BHH Suicide Risk Assessment (Signed)
[facility] Discharge Suicide Risk Assessment   Principal Problem: MDD (major depressive disorder), recurrent severe, without psychosis ([place]) Discharge Diagnoses:  Patient Active Problem List   Diagnosis Date Noted  . Feeling grief [F43.21]   . MDD (major depressive disorder), recurrent severe, without psychosis ([place]) [F33.2] 04/08/2015  . Acute stress disorder [F43.9] 04/08/2015  . Insomnia [G47.00] 04/08/2015  . Adjustment disorder with mixed anxiety and depressed mood [F43.23] 04/08/2015  . Recent bereavement [Z78.9] 04/07/2015  . Alcohol withdrawal ([place]) [F10.239] 04/06/2015  . CAP (community acquired pneumonia) [J18.9] 04/03/2015  . Overdose [T50.901A] 04/03/2015  . Acute encephalopathy [G93.40] 04/03/2015  . Acute respiratory failure with hypoxia ([place]) [J96.01] 04/03/2015  . Alcohol intoxication ([place]) [F10.129] 04/03/2015  . Tobacco use disorder [F17.200] 04/03/2015  . Hemothorax on left [J94.2] 04/09/2011  . Pleural effusion [J90] 03/27/2011  . Pneumonia [J18.9] 03/27/2011  . Tobacco abuse [Z72.0] 03/27/2011  . Hypertension [I10] 03/27/2011  . Macrocytosis without anemia [D75.89] 03/27/2011  . Depression [F32.9] 03/27/2011    Total Time spent with patient: 20 minutes  Musculoskeletal: Strength & Muscle Tone: within normal limits Gait & Station: normal Patient leans: normal  Psychiatric Specialty Exam: Review of Systems  Constitutional: Negative.   HENT: Negative.   Eyes: Negative.   Respiratory: Negative.   Cardiovascular: Negative.   Gastrointestinal: Negative.   Genitourinary: Negative.   Musculoskeletal: Negative.   Skin: Negative.   Neurological: Negative.   Endo/Heme/Allergies: Negative.   Psychiatric/Behavioral: Positive for depression.    Blood pressure 118/79, pulse 91, temperature 98.5 F (36.9 C), temperature source Oral, resp. rate 18, height 5\' 4"  (1.626 m), weight 56.7 kg (125 lb).Body mass index is 21.45 kg/(m^2).  General Appearance: Fairly Groomed   Engineer, water::  Fair  Speech:  Clear and [id number]  Volume:  Normal  Mood:  Depressed  Affect:  Appropriate  Thought Process:  Coherent and Goal Directed  Orientation:  Full (Time, Place, and Person)  Thought Content:  events plans as she moves on  Suicidal Thoughts:  No  Homicidal Thoughts:  No  Memory:  Immediate;   Fair Recent;   Fair Remote;   Fair  Judgement:  Fair  Insight:  Present  Psychomotor Activity:  Normal  Concentration:  Fair  Recall:  [employer] of Knowledge:Fair  Language: Fair  Akathisia:  No  Handed:  Right  AIMS (if indicated):     Assets:  Desire for Improvement Social Support  Sleep:  Number of Hours: 6.5  Cognition: WNL  ADL's:  Intact  She is In full contact with reality. There are no active SI plans or intent. She is aware that she just went trough the unexpected death of her husband and that she  has been grieving and that as expected, she will continue to grief for a while. She says she is ready to be D/C. She is going to be staying with her daughter. She will have to eventually leave the house she was staying at with her husband as she cant afford it. She is going to move in with her daughter. She plans to go back to school get some training so she can secure a job Mental Status Per Nursing Assessment::   On Admission:  NA  Demographic Factors:  Divorced or widowed and Caucasian  Loss Factors: Loss of significant relationship and Financial problems/change in socioeconomic status  Historical Factors: none identified  Risk Reduction Factors:   Sense of responsibility to family and Positive social support  Continued Clinical Symptoms:  Depression:   Insomnia  Cognitive Features That Contribute To Risk:  None    Suicide Risk:  Minimal: No identifiable suicidal ideation.  Patients presenting with no risk factors but with morbid ruminations; may be classified as minimal risk based on the severity of the depressive symptoms  Follow-up  Information    Follow up with Tamela Gammon On 04/12/2015.   Why:  Walk in at 9:00AM for assessment of mental health services. Please bring Photo ID/Social Security Card/Proof of income if you have it.    Contact information:   [place] [place], [place] [address] Phone: [phone] Fax: [phone]      Plan Of Care/Follow-up recommendations:  Activity:  as tolerated Diet:  regular Follow up as above Yzabelle Calles A, MD 04/10/2015, 12:10 PM

## 2015-04-10 NOTE — Progress Notes (Signed)
Pt attended spiritual care group on grief and loss facilitated by chaplain Jerene Pitch   Group opened with brief discussion and psycho-social ed around grief and loss in relationships and in relation to self - identifying life patterns, circumstances, changes that cause losses. Established group norm of speaking from own life experience. Group goal of establishing open and affirming space for members to share loss and experience with grief, normalize grief experience and provide psycho social education and grief support.    Group Participation:   Amyre was present throughout group.  Oriented x4 and attentive.  She was quiet during first half of group and offered affirmation of others through body language and nods.  She voluntarily spoke about the death of her husband on May 23, 2022.  Describing finding him and entering an anxious state after ambulance and her daughter left, she reported drinking ETOH to attempt to manage her anxiety and then feeling as though she dreamed ingesting pills.    Lorelle identified feeling fear, self-blame, anxiety, loss of hope and meaning.   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  Estephanie feels thrust into this part of her life - - making new relationships, changing life circumstances, finding work - - in ways she did not anticipate.  She recognizes loneliness and isolation in her role as mother, as she cares for her children in their grief.  Moreover, most of her friends were friends of her husband and are not able to support A[place].    Michela particularly identified self-blame, as her spouse had apnea and she feels she may have been able to get him more help.  However, in the group, she was able to speak back to this, stating, "I know there was nothing else I could have done."  Chani  received empathic support from group as members spoke about ways their own use of ETOH and mental health have complicated their grief process.  Group spoke about the "tasks of grief" coming up over and over and all at once and spoke about how they find support when they are feeling overwhelmed.   Joslynn is preparing to go back to the house she shared with her husband and clean out items.  Spoke with group about preparing for this process.       [place], [place]

## 2015-04-10 NOTE — Progress Notes (Signed)
  [facility] Adult Case Management Discharge Plan :  Will you be returning to the same living situation after discharge:  Yes,  home At discharge, do you have transportation home?: Yes,  daughter Do you have the ability to pay for your medications: Yes,  mental health  Release of information consent forms completed and submitted to medical records by CSW.  Patient to Follow up at: Follow-up Information    Follow up with Tamela Gammon On 04/12/2015.   Why:  Walk in at 9:00AM for assessment of mental health services. Please bring Photo ID/Social Security Card/Proof of income if you have it.    Contact information:   [place], [place] [address] Phone: [phone] Fax: [phone]      Next level of care provider has access to [place] and Suicide Prevention discussed: Yes,  SPE completed with pt's daughter. SPI pamphlet and mobile crisis information also provided.   Have you used any form of tobacco in the last 30 days? (Cigarettes, Smokeless Tobacco, Cigars, and/or Pipes): Yes  Has patient been referred to the Quitline?: Patient refused referral  Patient has been referred for addiction treatment: Pt. refused referral  Smart, Gabrianna Fassnacht LCSW 04/10/2015, 10:45 AM

## 2015-04-10 NOTE — Tx Team (Signed)
Interdisciplinary Treatment Plan Update (Adult)  Date:  04/10/2015  Time Reviewed:  8:28 AM   Progress in Treatment: Attending groups: Yes. Participating in groups:  Yes. Taking medication as prescribed:  Yes. Tolerating medication:  Yes. Family/Significant othe contact made:  SPE completed with pt's daughter.  Patient understands diagnosis:  Yes. and As evidenced by:  seeking treatment for depression, SI, alcohol abuse, and for medication stabilization. Discussing patient identified problems/goals with staff:  Yes. Medical problems stabilized or resolved:  Yes. Denies suicidal/homicidal ideation: Yes. Issues/concerns per patient self-inventory:  Other:   Discharge Plan or Barriers: Pt would like to return home and follow-up at [facility]. CSW left message for Juliann Pulse at [facility] in effort to schedule follow-up appt.   Reason for Continuation of Hospitalization: None  Comments:  Kayla Sandoval is an 45 y.o. female. Pt [facility] to [facility]. Per chart review, pt intentionally ingested an unknown quantity of Trazodone. Pt's daughter found pt unresponsive. Pt has a hx of depression. Her BAL was 388 upon arrival and UDS was + for THC. Pt is now under IVC. Pt is cooperative and oriented x 4. Her speech is coherent but a bit slurred. Her affect is sad. Pt describes mood as "up and down". She reports her husband died suddenly yesterday am. She says he uses a CPAP machine but they couldn't afford one after they lost their insurance. She says she tried to buy one online. She reports she intentionally ingested approx 10 Trazodone pills (unknown dose). Pt denies it was a suicide attempt. However, when writer asks what was going through pt's mind when she took the pills, pt replies, "My husband is dead." She reports she could keep herself safe if she was discharged because "I have lots of family around." She reports she drinks approx six 12-oz beers on the weekend and smokes "one joint" of  marijuana every two months. Pt denies hx of outpatient or inpatient [facility] treatment. She says her mother is a Engineer, water and that is the only [place] professional she has ever spoken with. Diagnosis: Major Depressive Disorder, Recurrent, Severe without Psychotic Features  Estimated length of stay:  D/c today   Additional Comments:  Patient and CSW reviewed pt's identified goals and treatment plan. Patient verbalized understanding and agreed to treatment plan. CSW reviewed [facility] "Discharge Process and Patient Involvement" Form. Pt verbalized understanding of information provided and signed form.    Review of initial/current patient goals per problem list:  1. Goal(s): Patient will participate in aftercare plan  Met: Yes   Target date: at discharge  As evidenced by: Patient will participate within aftercare plan AEB aftercare provider and housing plan at discharge being identified.  2/27: Pt to return home; follow-up at [facility].   2. Goal (s): Patient will exhibit decreased depressive symptoms and suicidal ideations.  Met: Yes.    Target date: at discharge  As evidenced by: Patient will utilize self rating of depression at 3 or below and demonstrate decreased signs of depression or be deemed stable for discharge by MD.  2/27: Pt rates depression as 0/10 and presents with pleasant mood/calm affect. Denies SI/HI/AVH.   3. Goal(s): Patient will demonstrate decreased signs of withdrawal due to substance abuse  Met:Yes   Target date:at discharge   As evidenced by: Patient will produce a CIWA/COWS score of 0, have stable vitals signs, and no symptoms of withdrawal.  2/27: Pt denies withdrawal symptoms with no CIWA/COWS score today and stable vitals.   Attendees:  Patient:   04/10/2015 8:28 AM   Family:   04/10/2015 8:28 AM   Physician:  Dr. Carlton Adam, MD 04/10/2015 8:28 AM   Nursing:   Birdie Hopes RN 04/10/2015 8:28 AM   Clinical Social Worker: Maxie Better, LCSW  04/10/2015 8:28 AM    04/10/2015 8:28 AM   Other:  Gerline Legacy Nurse Case Manager 04/10/2015 8:28 AM   Other:  04/10/2015 8:28 AM   Other:   04/10/2015 8:28 AM   Other:  04/10/2015 8:28 AM   Other:  04/10/2015 8:28 AM   Other:  04/10/2015 8:28 AM    04/10/2015 8:28 AM    04/10/2015 8:28 AM    04/10/2015 8:28 AM    04/10/2015 8:28 AM    Scribe for Treatment Team:   Maxie Better, LCSW 04/10/2015 8:28 AM

## 2015-11-03 ENCOUNTER — Encounter (HOSPITAL_COMMUNITY): Payer: Self-pay | Admitting: Cardiology

## 2015-11-03 ENCOUNTER — Emergency Department (HOSPITAL_COMMUNITY)
Admission: EM | Admit: 2015-11-03 | Discharge: 2015-11-03 | Disposition: A | Discharge status: Home / Self Care | Payer: Self-pay | Attending: Emergency Medicine | Admitting: Emergency Medicine

## 2015-11-03 ENCOUNTER — Emergency Department (HOSPITAL_COMMUNITY): Payer: Self-pay

## 2015-11-03 DIAGNOSIS — S0081XA Abrasion of other part of head, initial encounter: Secondary | ICD-10-CM | POA: Insufficient documentation

## 2015-11-03 DIAGNOSIS — Y939 Activity, unspecified: Secondary | ICD-10-CM | POA: Insufficient documentation

## 2015-11-03 DIAGNOSIS — F17210 Nicotine dependence, cigarettes, uncomplicated: Secondary | ICD-10-CM | POA: Insufficient documentation

## 2015-11-03 DIAGNOSIS — Z79899 Other long term (current) drug therapy: Secondary | ICD-10-CM | POA: Insufficient documentation

## 2015-11-03 DIAGNOSIS — R1013 Epigastric pain: Secondary | ICD-10-CM | POA: Insufficient documentation

## 2015-11-03 DIAGNOSIS — S20212A Contusion of left front wall of thorax, initial encounter: Secondary | ICD-10-CM | POA: Insufficient documentation

## 2015-11-03 DIAGNOSIS — I10 Essential (primary) hypertension: Secondary | ICD-10-CM | POA: Insufficient documentation

## 2015-11-03 DIAGNOSIS — S0990XA Unspecified injury of head, initial encounter: Secondary | ICD-10-CM | POA: Insufficient documentation

## 2015-11-03 DIAGNOSIS — Y999 Unspecified external cause status: Secondary | ICD-10-CM | POA: Insufficient documentation

## 2015-11-03 DIAGNOSIS — Y929 Unspecified place or not applicable: Secondary | ICD-10-CM | POA: Insufficient documentation

## 2015-11-03 LAB — I-STAT CHEM 8, ED
CALCIUM ION: 1.05 mmol/L — AB (ref 1.15–1.40)
CHLORIDE: 96 mmol/L — AB (ref 101–111)
Creatinine, Ser: 1.1 mg/dL — ABNORMAL HIGH (ref 0.44–1.00)
GLUCOSE: 95 mg/dL (ref 65–99)
HCT: 41 % (ref 36.0–46.0)
Hemoglobin: 13.9 g/dL (ref 12.0–15.0)
Potassium: 3.9 mmol/L (ref 3.5–5.1)
Sodium: 137 mmol/L (ref 135–145)
TCO2: 26 mmol/L (ref 0–100)
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
## 2015-11-03 IMAGING — CT Thorax^CAP_WITH_APH (Adult)
3 of 5 series · 15 of 36 positions shown, 18 images · IV contrast (APPLIED)
Comparison: none

[Series 2: cap with · axial · 0.76mm/px · z∈[-507,+13]mm · 10 of 128 slices shown, 13 images]
[im 12/128  mediastinal]
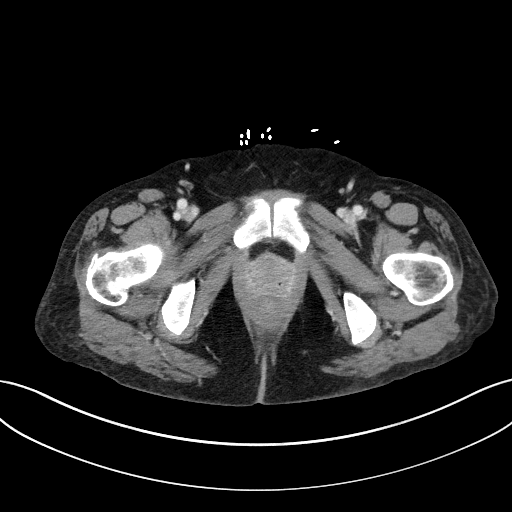
[im 12/128  lung]
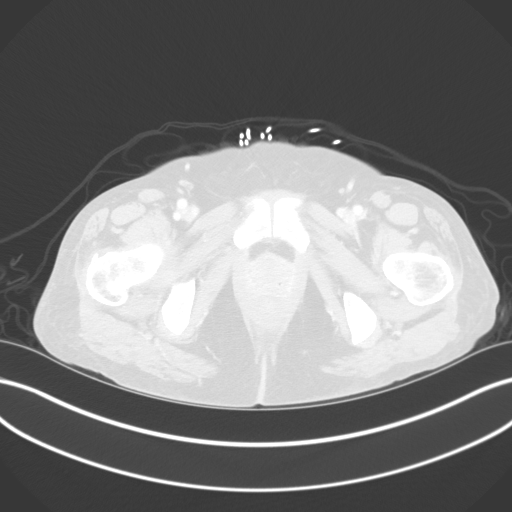
[im 24/128  lung]
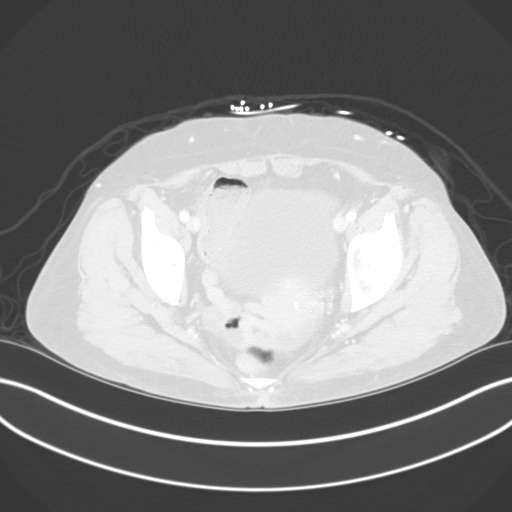
[im 35/128  lung]
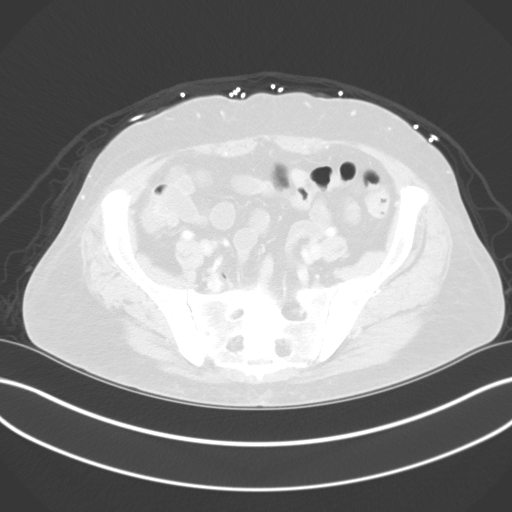
[im 47/128  lung]
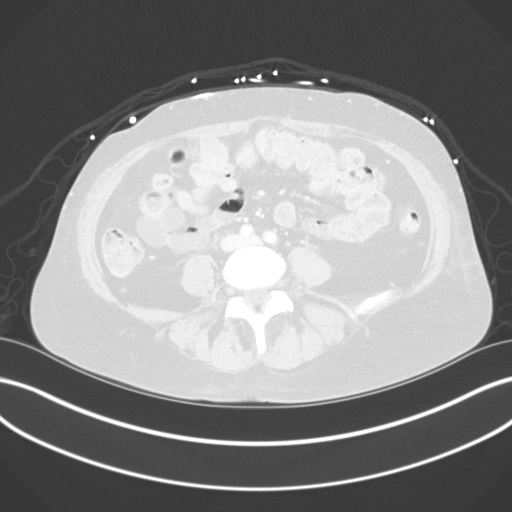
[im 58/128  mediastinal]
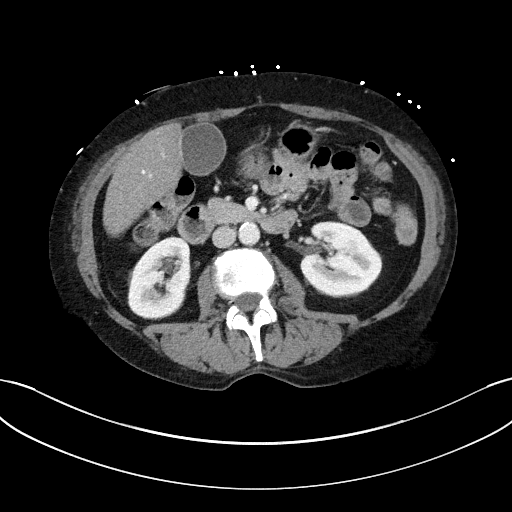
[im 58/128  lung]
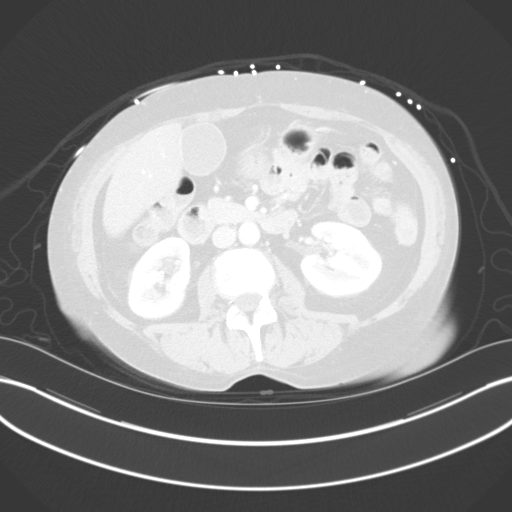
[im 70/128  lung]
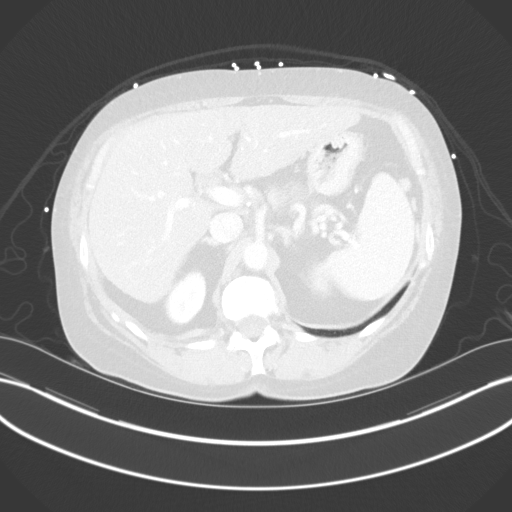
[im 81/128  lung]
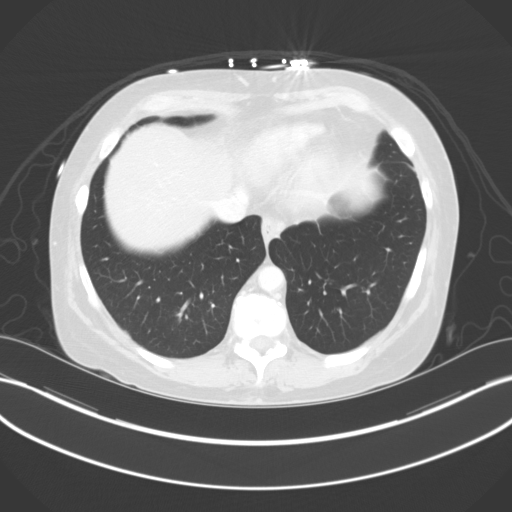
[im 93/128  lung]
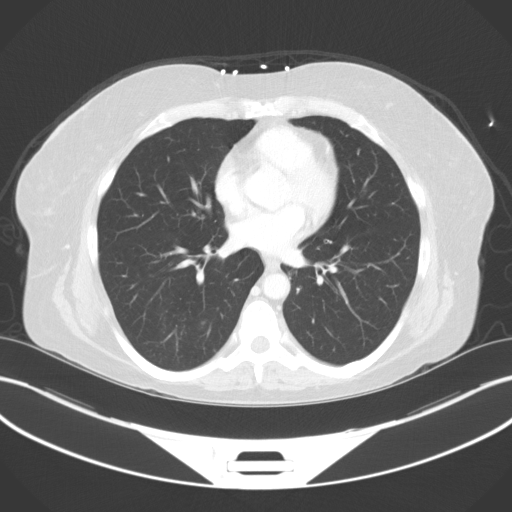
[im 104/128  mediastinal]
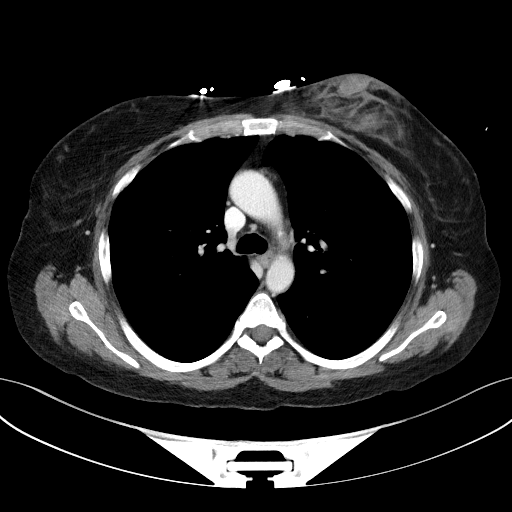
[im 104/128  lung]
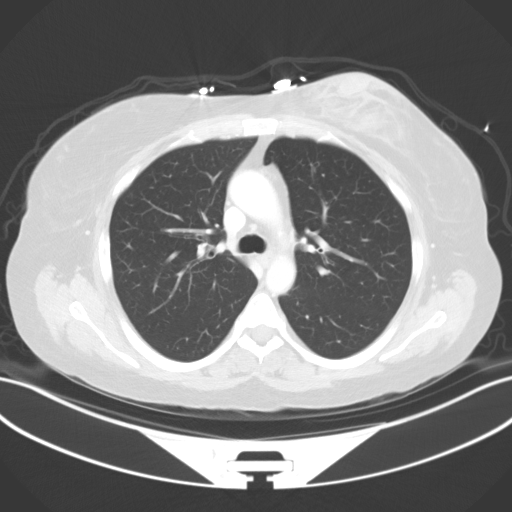
[im 116/128  lung]
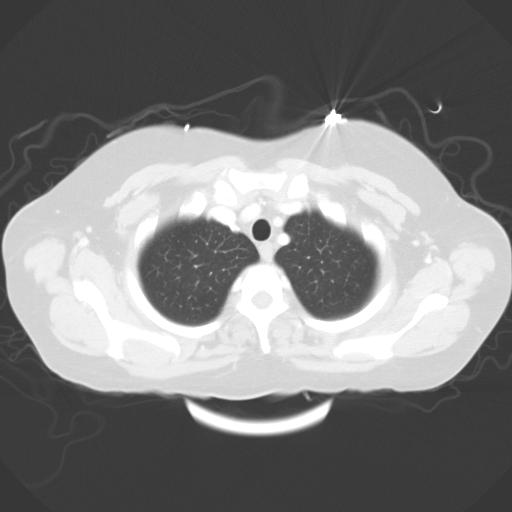

[Series 4: lung · axial · 0.76mm/px · z∈[-199,-151]mm · 2 of 147 slices shown]
[im 13/147  lung]
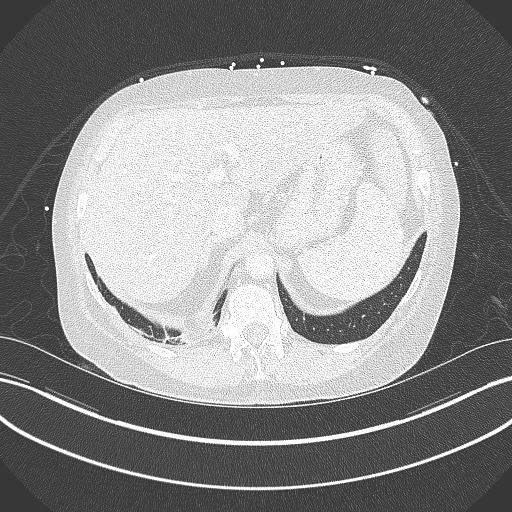
[im 37/147  lung]
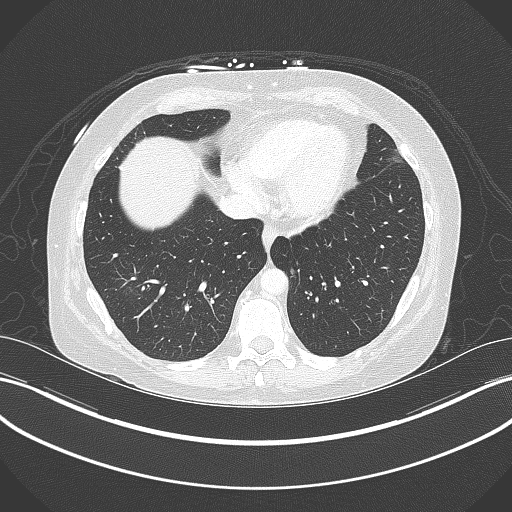

[Series 5: coronals · coronal · 0.80mm/px · 3 of 136 slices shown]
[im 28/136  lung]
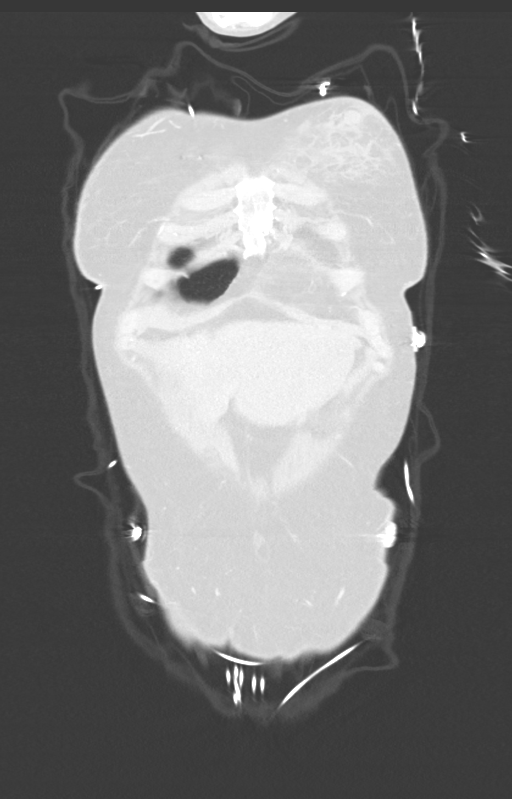
[im 55/136  lung]
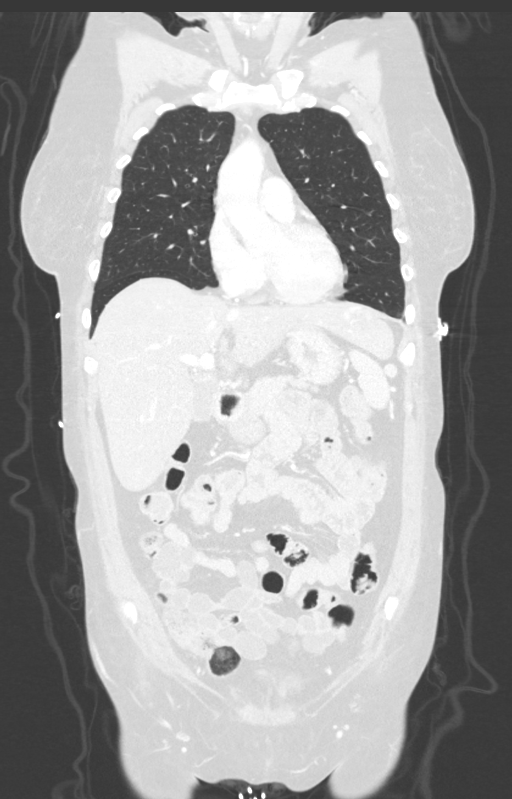
[im 82/136  lung]
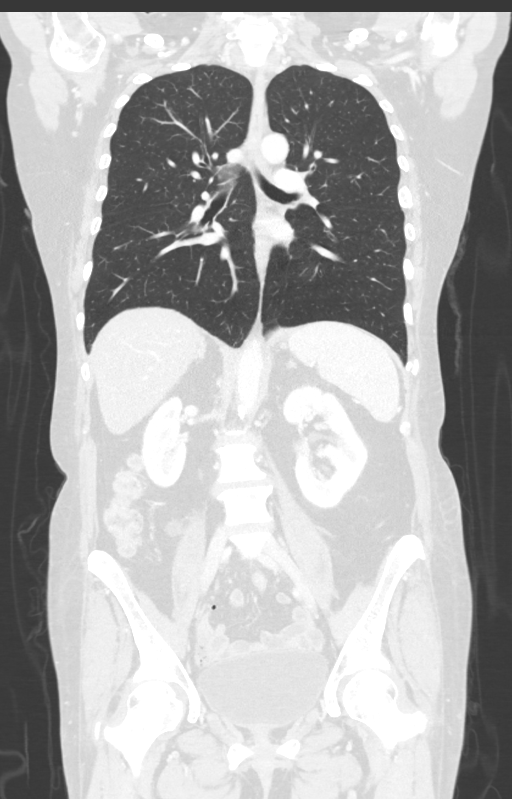

[15 of 36 positions shown; findings below may reference images not displayed]

:
CT chest and CT abdomen and pelvis reports are combined into a
single dictation.

## 2015-11-03 MED ORDER — SODIUM CHLORIDE 0.9 % IV SOLN
INTRAVENOUS | Status: DC
  Administered 2015-11-03: 18:00:00 via INTRAVENOUS

## 2015-11-03 MED ORDER — IOPAMIDOL (ISOVUE-300) INJECTION 61%
100.0000 mL | Freq: Once | INTRAVENOUS | Status: AC | PRN
  Administered 2015-11-03: 100 mL via INTRAVENOUS

## 2015-11-03 MED ORDER — SODIUM CHLORIDE 0.9 % IV BOLUS (SEPSIS)
1000.0000 mL | Freq: Once | INTRAVENOUS | Status: AC
  Administered 2015-11-03: 1000 mL via INTRAVENOUS
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
## 2015-11-03 NOTE — ED Notes (Signed)
Pt pulled her own iv from her right forearm and dripped blood all over the floor, then proceeded to walk out of the department before this nurse could stop her to check her arm.  Pt's arm had stopped bleeding and pt's arm was washed of blood.   Pressure dressing applied to iv site.  Site has a large hematoma from the patient removing her own iv.   This nurse requested the patient stay in her room until we could let the doctor know she wanted to leave.  Pt then came right out of the room and proceeded to leave the department.  Pt was walking down the hall, appeared appropriate and was walking without difficulty.   Dr Thurnell Garbe made aware and states she was just getting the patient's discharge papers ready.  Pt states she lives 2 blocks away and is walking home.

## 2016-02-08 ENCOUNTER — Emergency Department (HOSPITAL_COMMUNITY)
Admission: EM | Admit: 2016-02-08 | Discharge: 2016-02-09 | Disposition: A | Discharge status: Home / Self Care | Payer: Self-pay | Attending: Emergency Medicine | Admitting: Emergency Medicine

## 2016-02-08 DIAGNOSIS — Y9389 Activity, other specified: Secondary | ICD-10-CM | POA: Insufficient documentation

## 2016-02-08 DIAGNOSIS — Y999 Unspecified external cause status: Secondary | ICD-10-CM | POA: Insufficient documentation

## 2016-02-08 DIAGNOSIS — Y929 Unspecified place or not applicable: Secondary | ICD-10-CM | POA: Insufficient documentation

## 2016-02-08 DIAGNOSIS — S129XXA Fracture of neck, unspecified, initial encounter: Secondary | ICD-10-CM

## 2016-02-08 DIAGNOSIS — S12600A Unspecified displaced fracture of seventh cervical vertebra, initial encounter for closed fracture: Secondary | ICD-10-CM | POA: Insufficient documentation

## 2016-02-08 DIAGNOSIS — S12400A Unspecified displaced fracture of fifth cervical vertebra, initial encounter for closed fracture: Secondary | ICD-10-CM | POA: Insufficient documentation

## 2016-02-08 DIAGNOSIS — W1789XA Other fall from one level to another, initial encounter: Secondary | ICD-10-CM | POA: Insufficient documentation

## 2016-02-08 DIAGNOSIS — S22019A Unspecified fracture of first thoracic vertebra, initial encounter for closed fracture: Secondary | ICD-10-CM | POA: Insufficient documentation

## 2016-02-08 DIAGNOSIS — I10 Essential (primary) hypertension: Secondary | ICD-10-CM | POA: Insufficient documentation

## 2016-02-08 DIAGNOSIS — S0101XA Laceration without foreign body of scalp, initial encounter: Secondary | ICD-10-CM | POA: Insufficient documentation

## 2016-02-08 DIAGNOSIS — Z79899 Other long term (current) drug therapy: Secondary | ICD-10-CM | POA: Insufficient documentation

## 2016-02-08 DIAGNOSIS — F17210 Nicotine dependence, cigarettes, uncomplicated: Secondary | ICD-10-CM | POA: Insufficient documentation
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
## 2016-02-09 ENCOUNTER — Emergency Department (HOSPITAL_COMMUNITY): Payer: Self-pay

## 2016-02-09 LAB — POC URINE PREG, ED: PREG TEST UR: NEGATIVE
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
## 2016-02-09 MED ORDER — CYCLOBENZAPRINE HCL 10 MG PO TABS: 10.0000 mg | ORAL_TABLET | Freq: Three times a day (TID) | ORAL | 0 refills | Status: DC | PRN

## 2016-02-09 MED ORDER — ONDANSETRON HCL 4 MG/2ML IJ SOLN
4.0000 mg | Freq: Once | INTRAMUSCULAR | Status: AC
  Administered 2016-02-09: 4 mg via INTRAVENOUS
  Filled 2016-02-09: qty 2

## 2016-02-09 MED ORDER — HYDROCODONE-ACETAMINOPHEN 5-325 MG PO TABS
2.0000 | ORAL_TABLET | Freq: Once | ORAL | Status: AC
  Administered 2016-02-09: 2 via ORAL
  Filled 2016-02-09: qty 2

## 2016-02-09 MED ORDER — HYDROGEN PEROXIDE 3 % EX SOLN
CUTANEOUS | Status: AC
  Filled 2016-02-09: qty 473

## 2016-02-09 MED ORDER — POVIDONE-IODINE 10 % EX SOLN
CUTANEOUS | Status: AC
  Filled 2016-02-09: qty 118

## 2016-02-09 MED ORDER — LIDOCAINE-EPINEPHRINE (PF) 1 %-1:200000 IJ SOLN
INTRAMUSCULAR | Status: AC
  Filled 2016-02-09: qty 30

## 2016-02-09 MED ORDER — HYDROCODONE-ACETAMINOPHEN 5-325 MG PO TABS: 1.0000 | ORAL_TABLET | ORAL | 0 refills | Status: DC | PRN
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
## 2016-02-09 NOTE — ED Notes (Signed)
Pt given scrub pants and socks

## 2016-02-09 NOTE — ED Notes (Signed)
Pt gone over to CT 

## 2016-02-09 NOTE — ED Notes (Signed)
Pt vomiting and asking for pain medication; Dr. Regenia Skeeter informed of pt's vomiting, orders given

## 2016-02-09 NOTE — ED Notes (Signed)
Hard cervical collar placed with assistance by Jarrett Soho NT

## 2016-02-13 ENCOUNTER — Emergency Department (HOSPITAL_COMMUNITY): Payer: Self-pay

## 2016-02-13 ENCOUNTER — Emergency Department (HOSPITAL_COMMUNITY)
Admission: EM | Admit: 2016-02-13 | Discharge: 2016-02-14 | Disposition: A | Discharge status: Other Acute Inpatient Hospital | Payer: No Typology Code available for payment source | Attending: Emergency Medicine | Admitting: Emergency Medicine

## 2016-02-13 ENCOUNTER — Encounter (HOSPITAL_COMMUNITY): Payer: Self-pay

## 2016-02-13 DIAGNOSIS — I10 Essential (primary) hypertension: Secondary | ICD-10-CM | POA: Insufficient documentation

## 2016-02-13 DIAGNOSIS — F989 Unspecified behavioral and emotional disorders with onset usually occurring in childhood and adolescence: Secondary | ICD-10-CM | POA: Insufficient documentation

## 2016-02-13 DIAGNOSIS — F17210 Nicotine dependence, cigarettes, uncomplicated: Secondary | ICD-10-CM | POA: Insufficient documentation

## 2016-02-13 DIAGNOSIS — M542 Cervicalgia: Secondary | ICD-10-CM | POA: Insufficient documentation

## 2016-02-13 DIAGNOSIS — F332 Major depressive disorder, recurrent severe without psychotic features: Secondary | ICD-10-CM | POA: Diagnosis present

## 2016-02-13 DIAGNOSIS — R4689 Other symptoms and signs involving appearance and behavior: Secondary | ICD-10-CM

## 2016-02-13 HISTORY — DX: Anxiety disorder, unspecified: F41.9

## 2016-02-13 LAB — RAPID URINE DRUG SCREEN, HOSP PERFORMED
Amphetamines: NOT DETECTED
Barbiturates: NOT DETECTED
Benzodiazepines: NOT DETECTED
Cocaine: NOT DETECTED
OPIATES: POSITIVE — AB
TETRAHYDROCANNABINOL: NOT DETECTED

## 2016-02-13 LAB — COMPREHENSIVE METABOLIC PANEL
ALT: 57 U/L — AB (ref 14–54)
AST: 186 U/L — ABNORMAL HIGH (ref 15–41)
Albumin: 4.2 g/dL (ref 3.5–5.0)
Alkaline Phosphatase: 66 U/L (ref 38–126)
Anion gap: 14 (ref 5–15)
BUN: 13 mg/dL (ref 6–20)
CALCIUM: 8.9 mg/dL (ref 8.9–10.3)
CO2: 20 mmol/L — ABNORMAL LOW (ref 22–32)
CREATININE: 0.48 mg/dL (ref 0.44–1.00)
Chloride: 96 mmol/L — ABNORMAL LOW (ref 101–111)
Glucose, Bld: 70 mg/dL (ref 65–99)
Potassium: 3 mmol/L — ABNORMAL LOW (ref 3.5–5.1)
Sodium: 130 mmol/L — ABNORMAL LOW (ref 135–145)
Total Bilirubin: 2.9 mg/dL — ABNORMAL HIGH (ref 0.3–1.2)
Total Protein: 7.2 g/dL (ref 6.5–8.1)

## 2016-02-13 LAB — CBC WITH DIFFERENTIAL/PLATELET
Basophils Absolute: 0 10*3/uL (ref 0.0–0.1)
Basophils Relative: 0 %
Eosinophils Absolute: 0.1 10*3/uL (ref 0.0–0.7)
Eosinophils Relative: 1 %
HEMATOCRIT: 33.5 % — AB (ref 36.0–46.0)
HEMOGLOBIN: 12.5 g/dL (ref 12.0–15.0)
LYMPHS ABS: 0.6 10*3/uL — AB (ref 0.7–4.0)
LYMPHS PCT: 10 %
MCH: 38.8 pg — AB (ref 26.0–34.0)
MCHC: 37.3 g/dL — ABNORMAL HIGH (ref 30.0–36.0)
MCV: 104 fL — AB (ref 78.0–100.0)
Monocytes Absolute: 1.1 10*3/uL — ABNORMAL HIGH (ref 0.1–1.0)
Monocytes Relative: 18 %
NEUTROS ABS: 4.5 10*3/uL (ref 1.7–7.7)
NEUTROS PCT: 71 %
Platelets: 85 10*3/uL — ABNORMAL LOW (ref 150–400)
RBC: 3.22 MIL/uL — AB (ref 3.87–5.11)
RDW: 12.8 % (ref 11.5–15.5)
WBC: 6.3 10*3/uL (ref 4.0–10.5)

## 2016-02-13 LAB — SALICYLATE LEVEL

## 2016-02-13 LAB — ACETAMINOPHEN LEVEL

## 2016-02-13 LAB — ETHANOL

## 2016-02-13 IMAGING — CT HEAD^HEAD_SPIRAL_C_SPINE_APH (ADULT)
3 of 6 series · 12 of 33 positions shown, 14 images · non-contrast
Comparison: [DATE] CT exams

CLINICAL DATA: Auditory and visual hallucinations.

EXAM:
CT HEAD WITHOUT CONTRAST
CT CERVICAL SPINE WITHOUT CONTRAST
TECHNIQUE: Multidetector CT imaging of the head and cervical spine was
performed following the standard protocol without intravenous
contrast. Multiplanar CT image reconstructions of the cervical spine
were also generated.

[Series 8: c spine soft · axial · 0.31mm/px · z∈[-112,+14]mm · 6 of 81 slices shown, 8 images]
[im 9/81  soft-tissue]
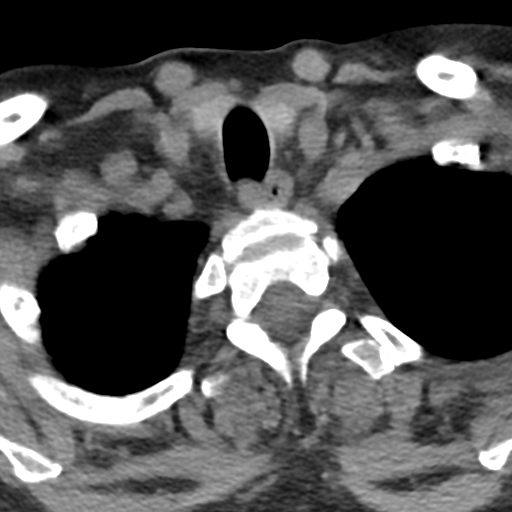
[im 9/81  bone]
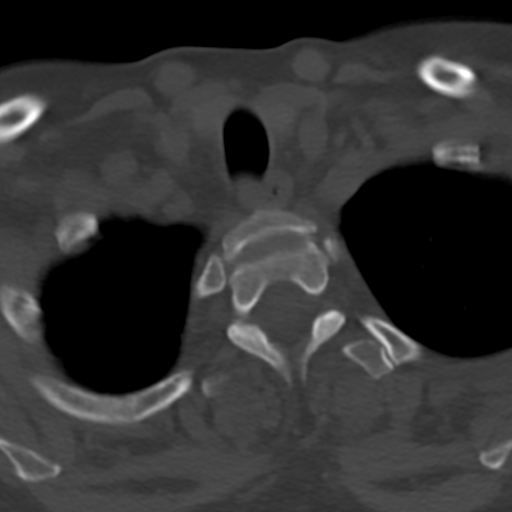
[im 27/81  bone]
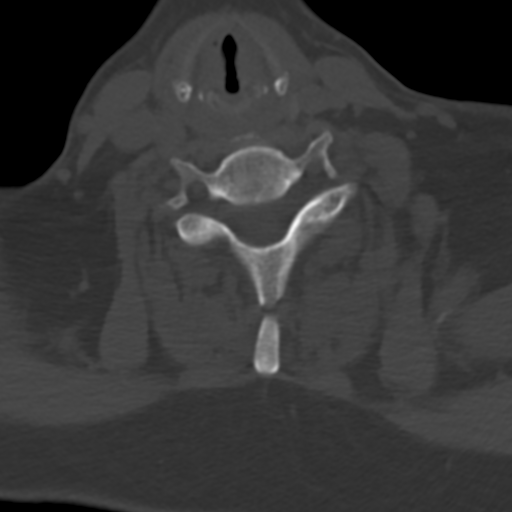
[im 36/81  bone]
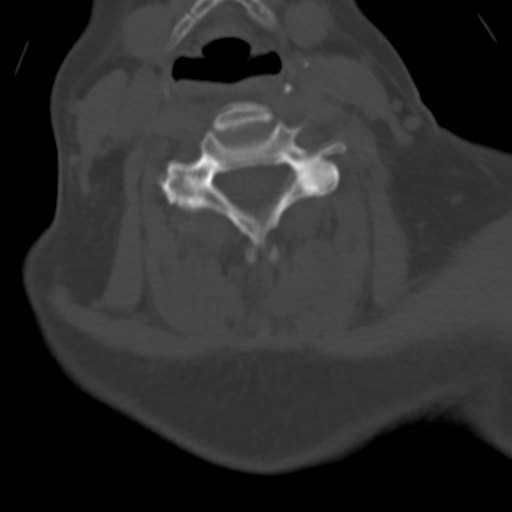
[im 45/81  bone]
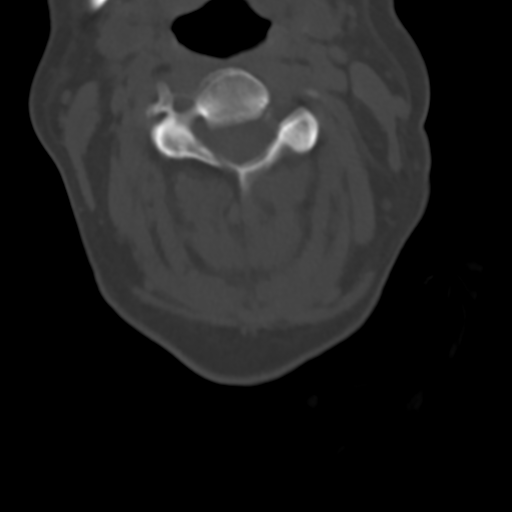
[im 63/81  soft-tissue]
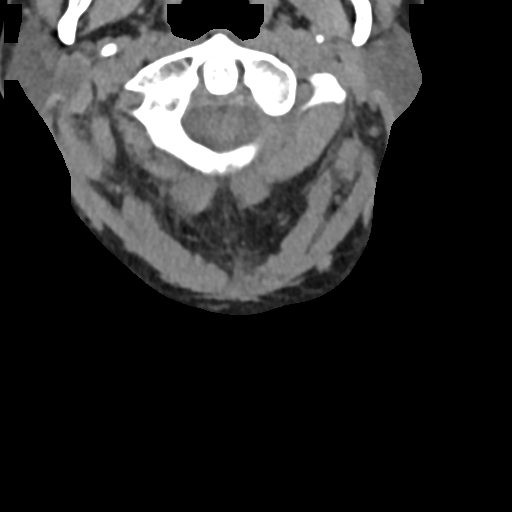
[im 63/81  bone]
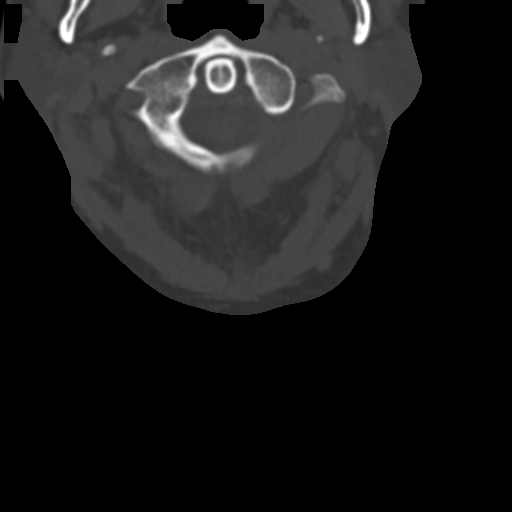
[im 72/81  bone]
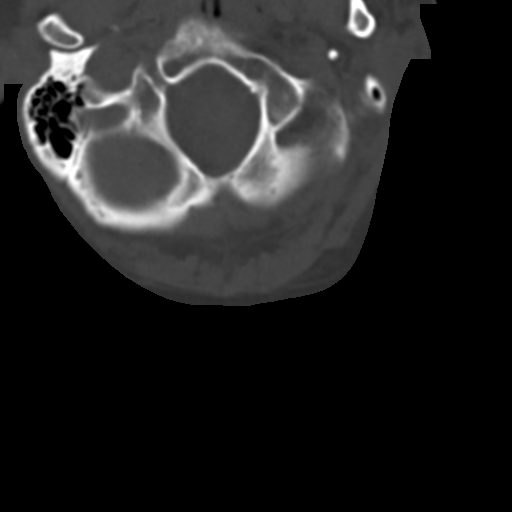

[Series 9: sagittal bone · sagittal · 0.20mm/px · 5 of 61 slices shown]
[im 11/61  bone]
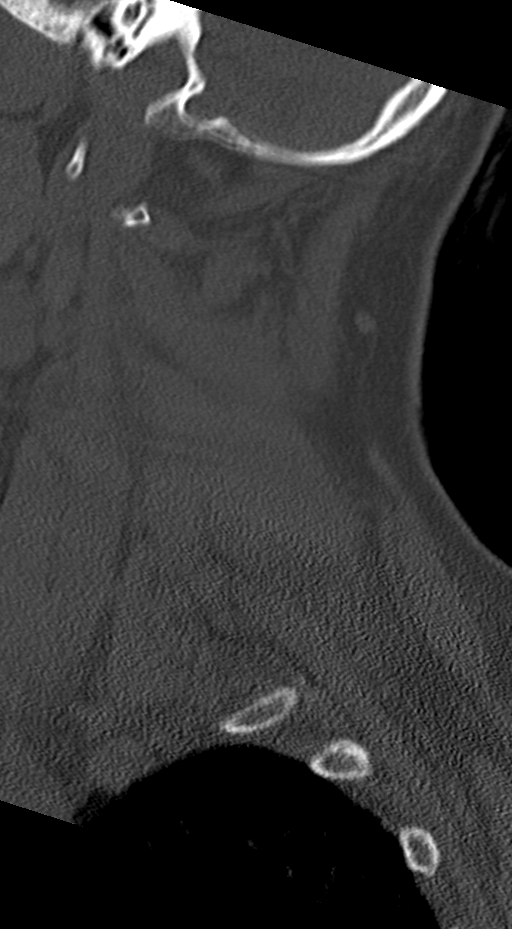
[im 21/61  bone]
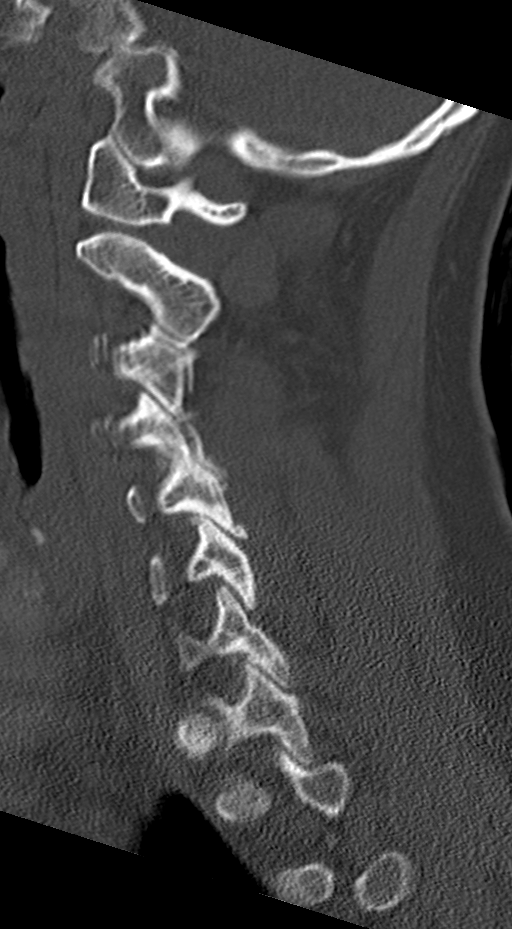
[im 31/61  bone]
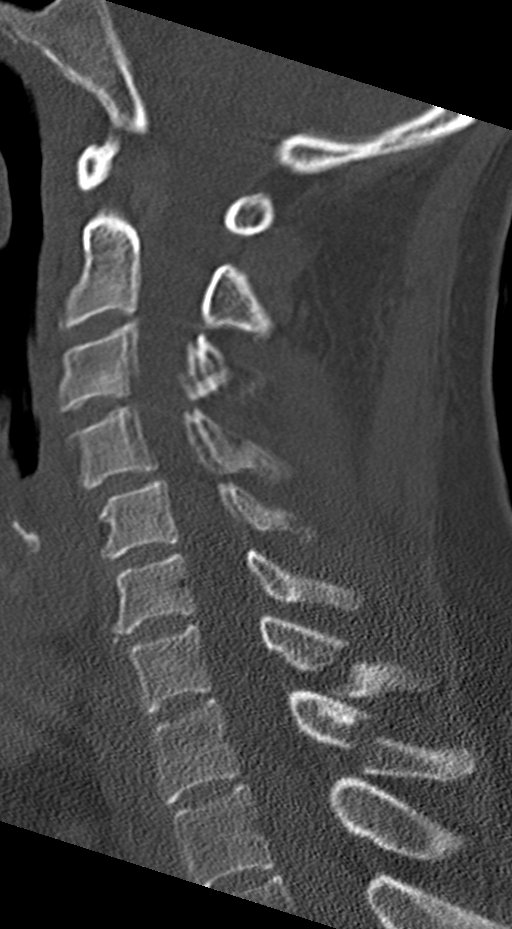
[im 41/61  bone]
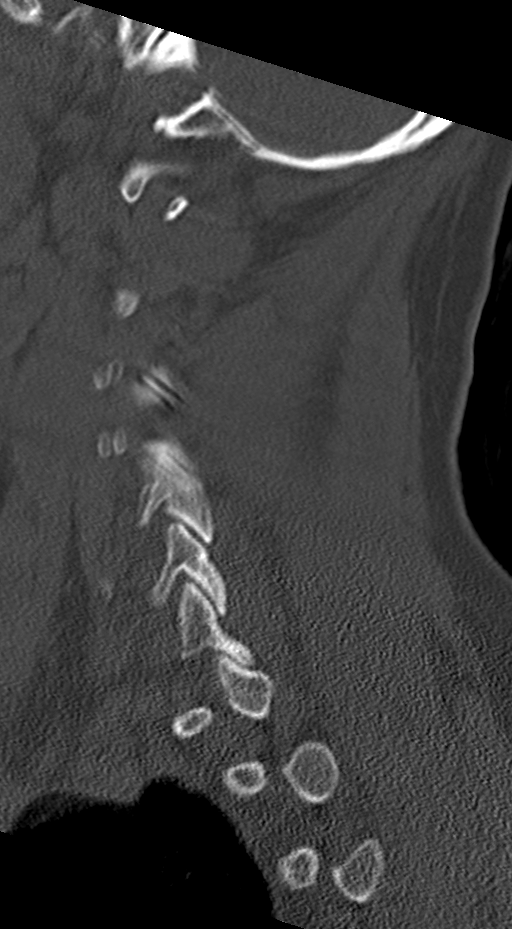
[im 51/61  bone]
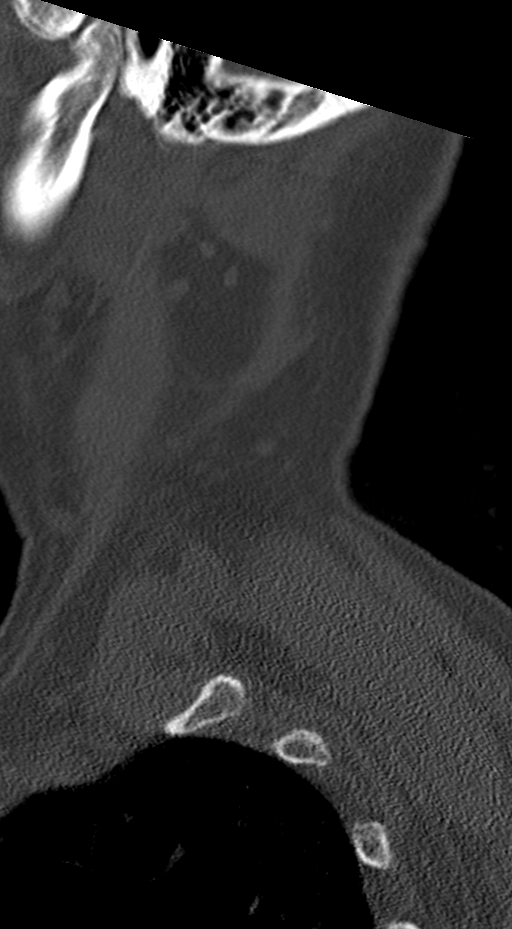

[Series 10: coronal bone · coronal · 0.23mm/px · 1 of 61 slices shown]
[im 31/61  bone]
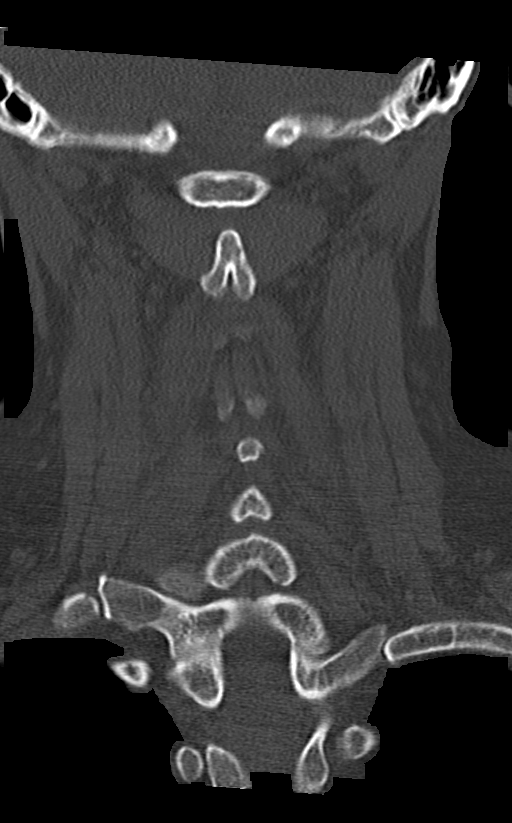

[12 of 33 positions shown; findings below may reference images not displayed]

FINDINGS: CT HEAD FINDINGS

Brain: Study limited by motion. No evidence of acute infarction,
intracranial hemorrhage, hydrocephalus or extra-axial fluid. , there
is no hydrocephalus. Basal cisterns are not effaced. Fourth
ventricle is midline.

Vascular: No hyperdense vessels.

Skull: No acute fracture.

Sinuses/Orbits: Air-fluid level in the partially visualized right
maxillary sinus may reflect sinusitis. No definite fracture
identified to the extent visible. Orbits appear symmetric. Globes
appear intact.

Other: Periorbital soft tissue swelling bilaterally more so on the
right with scalp hematoma overlying the frontoparietal skull. Skin
staples project anteriorly over the area of soft tissue swelling
presumably from laceration.

CT CERVICAL SPINE FINDINGS

Study slightly limited by patient motion.

Alignment: Within normal limits.

Skull base and vertebrae: Fractures of the spinous processes of C5,
C7 and T1 are again noted with slight displacement of C7 and T1. The
ring of C1 appears intact. No vertebral body fracture is noted.

Soft tissues and spinal canal: No intraspinal hematoma. No
prevertebral soft tissue swelling.

Disc levels: Small anterior osteophytes are noted along the lower
cervical spine. Disc levels are maintained.

Upper chest: Unremarkable

Other: None
IMPRESSION: New scalp hematoma overlying the right frontoparietal skull without
underlying fracture. Pre-existing skin staples from prior scalp
laceration overlying the right frontal calvarium.

No acute intracranial abnormality allowing for slight patient motion
artifacts. Small air-fluid level in the partially visualized right
maxillary sinus may reflect sinusitis. No definite fracture seen
about the orbit nor maxillary sinus.

Subacute C5, C7 and T1 spinous process fractures as before. No acute
vertebral body fracture. No intraspinal hematoma.

## 2016-02-13 MED ORDER — POTASSIUM CHLORIDE CRYS ER 20 MEQ PO TBCR
40.0000 meq | EXTENDED_RELEASE_TABLET | Freq: Once | ORAL | Status: AC
  Administered 2016-02-14: 40 meq via ORAL
  Filled 2016-02-13: qty 2

## 2016-02-13 MED ORDER — LORAZEPAM 1 MG PO TABS
1.0000 mg | ORAL_TABLET | Freq: Once | ORAL | Status: AC
  Administered 2016-02-13: 1 mg via ORAL
  Filled 2016-02-13: qty 1

## 2016-02-13 MED ORDER — LORAZEPAM 2 MG/ML IJ SOLN: 0.0000 mg | Freq: Two times a day (BID) | INTRAMUSCULAR | Status: DC

## 2016-02-13 MED ORDER — LORAZEPAM 2 MG/ML IJ SOLN: 0.0000 mg | Freq: Four times a day (QID) | INTRAMUSCULAR | Status: DC
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
## 2016-02-13 NOTE — ED Notes (Signed)
IVC papers served by [facility]

## 2016-02-13 NOTE — ED Notes (Addendum)
Kayla Sandoval wants to be called with any updates [phone]. Kayla reports that behavior has been occurring at least 2 days. States mother talking to car as if person inside car. Looking at jackets thinking they are dead people and that family members are dead. States she has also drank heavily and did drugs

## 2016-02-13 NOTE — ED Notes (Signed)
Attempted to complete TTS but patient was drowsy post Ativan. Chest rise and fall even and unlabored. No distress noted.

## 2016-02-13 NOTE — BH Assessment (Signed)
[facility] Assessment Progress Note   Clinician was informed by sitter Melynda Ripple that patient had been given medication and was sleeping.  Indeed, patient did not arouse when we were talking.  Nurse Jarome Matin will request that TTS consult be withdrawn until pt is alert and able to participate in assessment.  Clinician also requested patient IVC papers be faxed to [facility] at [phone].
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
## 2016-02-13 NOTE — ED Notes (Signed)
Pt transported to CT ?

## 2016-02-13 NOTE — ED Notes (Signed)
EDP at bedside. C collar placed on at this time

## 2016-02-14 ENCOUNTER — Encounter (HOSPITAL_COMMUNITY): Payer: Self-pay | Admitting: *Deleted

## 2016-02-14 ENCOUNTER — Inpatient Hospital Stay (HOSPITAL_COMMUNITY)
Admission: AD | Admit: 2016-02-14 | Discharge: 2016-02-16 | DRG: 897 | Disposition: A | Discharge status: Home / Self Care | Payer: No Typology Code available for payment source | Attending: Psychiatry | Admitting: Psychiatry

## 2016-02-14 DIAGNOSIS — F332 Major depressive disorder, recurrent severe without psychotic features: Secondary | ICD-10-CM | POA: Diagnosis present

## 2016-02-14 DIAGNOSIS — Z818 Family history of other mental and behavioral disorders: Secondary | ICD-10-CM

## 2016-02-14 DIAGNOSIS — Z23 Encounter for immunization: Secondary | ICD-10-CM | POA: Diagnosis not present

## 2016-02-14 DIAGNOSIS — Z8249 Family history of ischemic heart disease and other diseases of the circulatory system: Secondary | ICD-10-CM | POA: Diagnosis not present

## 2016-02-14 DIAGNOSIS — Y900 Blood alcohol level of less than 20 mg/100 ml: Secondary | ICD-10-CM | POA: Diagnosis present

## 2016-02-14 DIAGNOSIS — Z8781 Personal history of (healed) traumatic fracture: Secondary | ICD-10-CM

## 2016-02-14 DIAGNOSIS — F17210 Nicotine dependence, cigarettes, uncomplicated: Secondary | ICD-10-CM | POA: Diagnosis present

## 2016-02-14 DIAGNOSIS — F333 Major depressive disorder, recurrent, severe with psychotic symptoms: Secondary | ICD-10-CM | POA: Diagnosis present

## 2016-02-14 DIAGNOSIS — F410 Panic disorder [episodic paroxysmal anxiety] without agoraphobia: Secondary | ICD-10-CM | POA: Diagnosis present

## 2016-02-14 DIAGNOSIS — F17200 Nicotine dependence, unspecified, uncomplicated: Secondary | ICD-10-CM | POA: Diagnosis present

## 2016-02-14 DIAGNOSIS — F19951 Other psychoactive substance use, unspecified with psychoactive substance-induced psychotic disorder with hallucinations: Secondary | ICD-10-CM | POA: Diagnosis present

## 2016-02-14 DIAGNOSIS — F1020 Alcohol dependence, uncomplicated: Secondary | ICD-10-CM | POA: Diagnosis present

## 2016-02-14 DIAGNOSIS — Z79899 Other long term (current) drug therapy: Secondary | ICD-10-CM

## 2016-02-14 DIAGNOSIS — I10 Essential (primary) hypertension: Secondary | ICD-10-CM | POA: Diagnosis present

## 2016-02-14 DIAGNOSIS — Z7982 Long term (current) use of aspirin: Secondary | ICD-10-CM | POA: Diagnosis not present

## 2016-02-14 DIAGNOSIS — Z87828 Personal history of other (healed) physical injury and trauma: Secondary | ICD-10-CM | POA: Diagnosis not present

## 2016-02-14 DIAGNOSIS — F411 Generalized anxiety disorder: Secondary | ICD-10-CM | POA: Diagnosis present

## 2016-02-14 MED ORDER — CLONIDINE HCL 0.1 MG PO TABS: 0.1000 mg | ORAL_TABLET | Freq: Every day | ORAL | Status: DC

## 2016-02-14 MED ORDER — ONDANSETRON 4 MG PO TBDP: 4.0000 mg | ORAL_TABLET | Freq: Four times a day (QID) | ORAL | Status: DC | PRN

## 2016-02-14 MED ORDER — LOPERAMIDE HCL 2 MG PO CAPS
2.0000 mg | ORAL_CAPSULE | ORAL | Status: DC | PRN
Start: 2016-02-14 — End: 2016-02-15

## 2016-02-14 MED ORDER — HYDROXYZINE HCL 25 MG PO TABS: 25.0000 mg | ORAL_TABLET | Freq: Four times a day (QID) | ORAL | Status: DC | PRN

## 2016-02-14 MED ORDER — PNEUMOCOCCAL VAC POLYVALENT 25 MCG/0.5ML IJ INJ
0.5000 mL | INJECTION | INTRAMUSCULAR | Status: AC
  Administered 2016-02-15: 0.5 mL via INTRAMUSCULAR

## 2016-02-14 MED ORDER — CLONIDINE HCL 0.1 MG PO TABS
0.1000 mg | ORAL_TABLET | Freq: Four times a day (QID) | ORAL | Status: DC
  Administered 2016-02-14 – 2016-02-15 (×2): 0.1 mg via ORAL
  Filled 2016-02-14 (×7): qty 1

## 2016-02-14 MED ORDER — INFLUENZA VAC SPLIT QUAD 0.5 ML IM SUSY
0.5000 mL | PREFILLED_SYRINGE | INTRAMUSCULAR | Status: AC
  Administered 2016-02-15: 0.5 mL via INTRAMUSCULAR
  Filled 2016-02-14: qty 0.5

## 2016-02-14 MED ORDER — CLONIDINE HCL 0.1 MG PO TABS: 0.1000 mg | ORAL_TABLET | ORAL | Status: DC

## 2016-02-14 MED ORDER — MAGNESIUM HYDROXIDE 400 MG/5ML PO SUSP: 30.0000 mL | Freq: Every day | ORAL | Status: DC | PRN

## 2016-02-14 MED ORDER — NAPROXEN 500 MG PO TABS
500.0000 mg | ORAL_TABLET | Freq: Two times a day (BID) | ORAL | Status: DC | PRN
  Administered 2016-02-14 – 2016-02-16 (×4): 500 mg via ORAL
  Filled 2016-02-14 (×5): qty 1

## 2016-02-14 MED ORDER — METHOCARBAMOL 500 MG PO TABS
500.0000 mg | ORAL_TABLET | Freq: Three times a day (TID) | ORAL | Status: DC | PRN
Start: 2016-02-14 — End: 2016-02-16
  Administered 2016-02-15: 500 mg via ORAL
  Filled 2016-02-14: qty 1

## 2016-02-14 MED ORDER — ALUM & MAG HYDROXIDE-SIMETH 200-200-20 MG/5ML PO SUSP: 30.0000 mL | ORAL | Status: DC | PRN

## 2016-02-14 MED ORDER — DICYCLOMINE HCL 20 MG PO TABS: 20.0000 mg | ORAL_TABLET | Freq: Four times a day (QID) | ORAL | Status: DC | PRN

## 2016-02-14 MED ORDER — NICOTINE POLACRILEX 2 MG MT GUM: 2.0000 mg | CHEWING_GUM | OROMUCOSAL | Status: DC | PRN

## 2016-02-14 MED ORDER — TRAZODONE HCL 50 MG PO TABS
50.0000 mg | ORAL_TABLET | Freq: Every evening | ORAL | Status: DC | PRN
  Administered 2016-02-14: 50 mg via ORAL
  Filled 2016-02-14: qty 7
  Filled 2016-02-14: qty 1

## 2016-02-14 MED ORDER — CITALOPRAM HYDROBROMIDE 10 MG PO TABS
10.0000 mg | ORAL_TABLET | Freq: Every day | ORAL | Status: DC
  Administered 2016-02-15 – 2016-02-16 (×2): 10 mg via ORAL
  Filled 2016-02-14: qty 1
  Filled 2016-02-14: qty 7
  Filled 2016-02-14 (×2): qty 1

## 2016-02-14 MED ORDER — ACETAMINOPHEN 325 MG PO TABS
650.0000 mg | ORAL_TABLET | Freq: Four times a day (QID) | ORAL | Status: DC | PRN
  Administered 2016-02-15 (×2): 650 mg via ORAL
  Filled 2016-02-14 (×2): qty 2

## 2016-02-14 NOTE — ED Notes (Signed)
Pt assessed by dr [name]. Staples/sutures can come out vo dr [name]

## 2016-02-14 NOTE — ED Notes (Signed)
Attempted to call son per pt agreement to update. Unable to leave a message
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
## 2016-02-14 NOTE — Tx Team (Signed)
Initial Treatment Plan 02/14/2016 11:47 PM Kayla Sandoval [id number]    PATIENT STRESSORS: Financial difficulties Health problems Loss of husband to death Substance abuse   PATIENT STRENGTHS: Agricultural engineer for treatment/growth Supportive family/friends   PATIENT IDENTIFIED PROBLEMS: Psychosis  Substance Abuse  Anxiety  "Go home"  "Go back to school and get a job"             DISCHARGE CRITERIA:  Ability to meet basic life and health needs Improved stabilization in mood, thinking, and/or behavior Motivation to continue treatment in a less acute level of care Need for constant or close observation no longer present Withdrawal symptoms are absent or subacute and managed without 24-hour nursing intervention  PRELIMINARY DISCHARGE PLAN: Attend 12-step recovery group Outpatient therapy Return to previous living arrangement  PATIENT/FAMILY INVOLVEMENT: This treatment plan has been presented to and reviewed with the patient, Kayla Sandoval.  The patient and family have been given the opportunity to ask questions and make suggestions.  Dustin Flock, RN 02/14/2016, 11:47 PM

## 2016-02-14 NOTE — ED Notes (Signed)
Pt dc with [employer] at this time. 2 bags of pt belongings given to officer. Nad.
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
## 2016-02-14 NOTE — ED Provider Notes (Signed)
Pt stable Pt needs re-eval in the morning  EKG Interpretation  Date/Time:  Tuesday February 13 2016 15:17:03 EST Ventricular Rate:  91 PR Interval:    QRS Duration: 91 QT Interval:  381 QTC Calculation: 469 R Axis:   26 Text Interpretation:  Sinus rhythm Inferior infarct, age indeterminate Interpretation limited secondary to artifact No significant change since last tracing Confirmed by Christy Gentles  MD, Hooper ([id number]) on 02/14/2016 3:03:08 AM          Ripley Fraise, MD 02/14/16 [phone]

## 2016-02-14 NOTE — ED Notes (Signed)
Six staples removed from pt scalp. Six sutures removed. Minimal bleeding noted. Site cleansed prior to and post suture/staple removal.

## 2016-02-14 NOTE — Progress Notes (Signed)
Pt accepted to [facility] bed [id number], attending Dr Shea Evans. Can arrive 17:00 per [facility]. Pt under IVC. Informed [facility].

## 2016-02-14 NOTE — ED Notes (Signed)
Per TTS. Pt accepted at [facility] by Dr. Shea Evans. Not to arrive until 5pm. Bed 508-2  Report [phone]

## 2016-02-14 NOTE — ED Notes (Signed)
Note continued..pt stated "oh was I sleeping the whole time".  Pt stated does not recall having any a/v/h while she has been here. A/o to date/time/situation/dob. Aware will have TTS

## 2016-02-14 NOTE — ED Notes (Signed)
Son called back and was updated. Was given [facility] number also

## 2016-02-14 NOTE — ED Notes (Signed)
Attempted report to [facility], no answer x 2

## 2016-02-15 ENCOUNTER — Encounter (HOSPITAL_COMMUNITY): Payer: Self-pay | Admitting: Psychiatry

## 2016-02-15 DIAGNOSIS — F332 Major depressive disorder, recurrent severe without psychotic features: Secondary | ICD-10-CM

## 2016-02-15 DIAGNOSIS — F19951 Other psychoactive substance use, unspecified with psychoactive substance-induced psychotic disorder with hallucinations: Secondary | ICD-10-CM | POA: Clinically undetermined

## 2016-02-15 DIAGNOSIS — Z79899 Other long term (current) drug therapy: Secondary | ICD-10-CM

## 2016-02-15 DIAGNOSIS — Z8781 Personal history of (healed) traumatic fracture: Secondary | ICD-10-CM

## 2016-02-15 DIAGNOSIS — Z8249 Family history of ischemic heart disease and other diseases of the circulatory system: Secondary | ICD-10-CM

## 2016-02-15 DIAGNOSIS — Z87828 Personal history of other (healed) physical injury and trauma: Secondary | ICD-10-CM

## 2016-02-15 DIAGNOSIS — F1020 Alcohol dependence, uncomplicated: Secondary | ICD-10-CM

## 2016-02-15 DIAGNOSIS — Z818 Family history of other mental and behavioral disorders: Secondary | ICD-10-CM

## 2016-02-15 HISTORY — DX: Alcohol dependence, uncomplicated: F10.20

## 2016-02-15 HISTORY — DX: Other psychoactive substance use, unspecified with psychoactive substance-induced psychotic disorder with hallucinations: F19.951

## 2016-02-15 HISTORY — DX: Personal history of (healed) traumatic fracture: Z87.81

## 2016-02-15 HISTORY — DX: Personal history of other (healed) physical injury and trauma: Z87.828

## 2016-02-15 LAB — LIPID PANEL
CHOL/HDL RATIO: 3 ratio
Cholesterol: 170 mg/dL (ref 0–200)
HDL: 57 mg/dL (ref >40)
LDL CALC: 96 mg/dL (ref 0–99)
TRIGLYCERIDES: 85 mg/dL (ref <150)
VLDL: 17 mg/dL (ref 0–40)

## 2016-02-15 LAB — TSH: TSH: 4.219 u[IU]/mL (ref 0.350–4.500)

## 2016-02-15 MED ORDER — LORAZEPAM 1 MG PO TABS
1.0000 mg | ORAL_TABLET | Freq: Three times a day (TID) | ORAL | Status: DC
  Administered 2016-02-16: 1 mg via ORAL
  Filled 2016-02-15: qty 1

## 2016-02-15 MED ORDER — VITAMIN B-1 100 MG PO TABS
100.0000 mg | ORAL_TABLET | Freq: Every day | ORAL | Status: DC
  Administered 2016-02-16: 100 mg via ORAL
  Filled 2016-02-15 (×3): qty 1

## 2016-02-15 MED ORDER — LORAZEPAM 1 MG PO TABS
1.0000 mg | ORAL_TABLET | Freq: Four times a day (QID) | ORAL | Status: AC
  Administered 2016-02-15 – 2016-02-16 (×4): 1 mg via ORAL
  Filled 2016-02-15 (×4): qty 1

## 2016-02-15 MED ORDER — ONDANSETRON 4 MG PO TBDP: 4.0000 mg | ORAL_TABLET | Freq: Four times a day (QID) | ORAL | Status: DC | PRN

## 2016-02-15 MED ORDER — HYDROXYZINE HCL 25 MG PO TABS
25.0000 mg | ORAL_TABLET | Freq: Four times a day (QID) | ORAL | Status: DC | PRN
  Filled 2016-02-15: qty 10

## 2016-02-15 MED ORDER — NICOTINE 14 MG/24HR TD PT24
14.0000 mg | MEDICATED_PATCH | Freq: Every day | TRANSDERMAL | Status: DC
  Administered 2016-02-15 – 2016-02-16 (×2): 14 mg via TRANSDERMAL
  Filled 2016-02-15 (×4): qty 1

## 2016-02-15 MED ORDER — QUETIAPINE FUMARATE 25 MG PO TABS
25.0000 mg | ORAL_TABLET | Freq: Every day | ORAL | Status: DC
  Administered 2016-02-15: 25 mg via ORAL
  Filled 2016-02-15: qty 1
  Filled 2016-02-15: qty 7
  Filled 2016-02-15: qty 1

## 2016-02-15 MED ORDER — ADULT MULTIVITAMIN W/MINERALS CH
1.0000 | ORAL_TABLET | Freq: Every day | ORAL | Status: DC
  Administered 2016-02-15 – 2016-02-16 (×2): 1 via ORAL
  Filled 2016-02-15 (×4): qty 1

## 2016-02-15 MED ORDER — LORAZEPAM 1 MG PO TABS: 1.0000 mg | ORAL_TABLET | Freq: Four times a day (QID) | ORAL | Status: DC | PRN

## 2016-02-15 MED ORDER — LISINOPRIL 10 MG PO TABS
10.0000 mg | ORAL_TABLET | Freq: Every day | ORAL | Status: DC
  Administered 2016-02-15 – 2016-02-16 (×2): 10 mg via ORAL
  Filled 2016-02-15 (×3): qty 1
  Filled 2016-02-15: qty 7

## 2016-02-15 MED ORDER — LORAZEPAM 1 MG PO TABS: 1.0000 mg | ORAL_TABLET | Freq: Every day | ORAL | Status: DC

## 2016-02-15 MED ORDER — FOLIC ACID 1 MG PO TABS
1.0000 mg | ORAL_TABLET | Freq: Every day | ORAL | Status: DC
Start: 2016-02-15 — End: 2016-02-16
  Administered 2016-02-15 – 2016-02-16 (×2): 1 mg via ORAL
  Filled 2016-02-15 (×4): qty 1

## 2016-02-15 MED ORDER — LORAZEPAM 1 MG PO TABS: 1.0000 mg | ORAL_TABLET | Freq: Two times a day (BID) | ORAL | Status: DC

## 2016-02-15 MED ORDER — BENZTROPINE MESYLATE 0.5 MG PO TABS: 0.5000 mg | ORAL_TABLET | Freq: Three times a day (TID) | ORAL | Status: DC | PRN

## 2016-02-15 MED ORDER — LOPERAMIDE HCL 2 MG PO CAPS
2.0000 mg | ORAL_CAPSULE | ORAL | Status: DC | PRN
Start: 2016-02-15 — End: 2016-02-16

## 2016-02-15 MED ORDER — THIAMINE HCL 100 MG/ML IJ SOLN
100.0000 mg | Freq: Once | INTRAMUSCULAR | Status: AC
  Administered 2016-02-15: 100 mg via INTRAMUSCULAR
  Filled 2016-02-15: qty 2

## 2016-02-15 MED ORDER — HALOPERIDOL 5 MG PO TABS: 5.0000 mg | ORAL_TABLET | Freq: Three times a day (TID) | ORAL | Status: DC | PRN

## 2016-02-15 MED ORDER — BENZTROPINE MESYLATE 1 MG/ML IJ SOLN: 0.5000 mg | Freq: Three times a day (TID) | INTRAMUSCULAR | Status: DC | PRN

## 2016-02-15 MED ORDER — HALOPERIDOL LACTATE 5 MG/ML IJ SOLN: 5.0000 mg | Freq: Three times a day (TID) | INTRAMUSCULAR | Status: DC | PRN

## 2016-02-15 NOTE — Progress Notes (Signed)
Recreation Therapy Notes  Date: 02/15/16 Time: 1000 Location: 500 Hall Dayroom  Group Topic: Leisure Education  Goal Area(s) Addresses:  Patient will identify positive leisure activities.  Patient will identify one positive benefit of participation in leisure activities.   Behavioral Response: Engaged  Intervention: Visual merchandiser, markers, scissors, glue sticks and magazines  Activity: Leisure PSA.  Patients were to create a public service announcement to explain the benefits of recreation and leisure.  Patients were to also provide examples of various types of recreation and leisure.  Education:  Leisure Education, Dentist  Education Outcome: Acknowledges education/In group clarification offered/Needs additional education  Clinical Observations/Feedback: Pt described leisure as something "fun and relaxing".  Pt gave examples of leisure as hanging with pets, cooking and gardening.  Pt also stated that leisure can help you stay "focused and calm".   Victorino Sparrow, LRT/CTRS         Victorino Sparrow A 02/15/2016 12:11 PM
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
## 2016-02-15 NOTE — Progress Notes (Signed)
Patient did not attend karaoke group tonight. 

## 2016-02-15 NOTE — Plan of Care (Signed)
Problem: Safety: Goal: Periods of time without injury will increase Outcome: Progressing Client is safe on the unit AEB q61min safety checks.

## 2016-02-15 NOTE — BHH Counselor (Signed)
Adult Comprehensive Assessment  Patient ID: Kayla Sandoval, female   DOB: May 15, 1970, 46 y.o.   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 Family Relationships: supportive children and mother Museum/gallery curator / Lack of resources (include bankruptcy): supported by mother who is retired Careers information officer / Lack of housing:  moved in w mother after death of husband Physical health (include injuries & life threatening diseases): recent fall down concrete steps one week ago - 3 fractured vertebrae, bruises, head contusion; second fall 5 days ago, states she fell from her bed that is placed on top of couch, fell from stool needed to climb into bed Social relationships: "one friend", "I dont have friends" Substance abuse: 3 beers/day w friends; does not want to stop drinking Bereavement / Loss: death of husband 05-11-2015; died in sleep of sleep apnea and obesity  Living/Environment/Situation:  Living Arrangements: Parent Living conditions (as described by patient or guardian): lives in mother's home How long has patient lived in current situation?: since April 2017 What is atmosphere in current home: Supportive  Family History:  Marital status: Widowed Widowed, when?: 03/02/15; was married for 23 years Are you sexually active?: Yes What is your sexual orientation?: Heterosexual Has your sexual activity been affected by drugs, alcohol, medication, or emotional stress?: no Does patient have children?: Yes How is patient's relationship with their children?: Good with both; daughter 65, son 62; both live w friends and are available to help mother when needed;   Childhood History:  By whom was/is the patient raised?: Mother Additional childhood history information: Grew up with mother and one sister; "I never knew my  father" Description of patient's relationship with caregiver when they were a child: "Good" w mother; "she is my best friend" Patient's description of current relationship with people who raised him/her: close to mother who is very supportive How were you disciplined when you got in trouble as a child/adolescent?: Reprimanded; grounded; nothing major Does patient have siblings?: Yes Number of Siblings: 2 Description of patient's current relationship with siblings: Sister who lives in [place], brothers in [place] "but we are estranged" Did patient suffer any verbal/emotional/physical/sexual abuse as a child?: No Did patient suffer from severe childhood neglect?: No Has patient ever been sexually abused/assaulted/raped as an adolescent or adult?: No Was the patient ever a victim of a crime or a disaster?: No Witnessed domestic violence?: No Has patient been effected by domestic violence as an adult?: No  Education:  Highest grade of school patient has completed: High school graduate Currently a student?: No Learning disability?: No  Employment/Work Situation:   Employment situation: Unemployed Patient's job has been impacted by current illness: No What is the longest time patient has a held a job?: 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; "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 was the sole provider" Where was the patient employed at that time?: see above Has patient ever been in the [employer]?: No (husband was in [employer]) Has patient ever served in combat?: No Did You Receive Any Psychiatric Treatment/Services While in Passenger transport manager?: No  Financial Resources:   Financial resources: Support from parents / caregiver Does patient have a Programmer, applications or guardian?: No  Alcohol/Substance Abuse:   What has been your use of drugs/alcohol within the last 12 months?: daily use of 3 12 oz beers/day; "I drink w my friends, social"; no desire  to stop drinking; denies all other use of nonprescribed  medications; states she took one of mothers pain pills after fall down concrete steps; daughter was unable to get medications from pharmacy for 2 days If attempted suicide, did drugs/alcohol play a role in this?: No Alcohol/Substance Abuse Treatment Hx: Denies past history Has alcohol/substance abuse ever caused legal problems?: No  Social Support System:   Pensions consultant Support System: Fair Astronomer System: "I have no friends", "well, I have one friend" Type of faith/religion: "I used to be [employer] does patient's faith help to cope with current illness?: "I can believe in God wherever I am"  Leisure/Recreation:   Leisure and Hobbies: Designer, television/film set, crafts, drawing, "used to hike"  Strengths/Needs:   What things does the patient do well?: caregiving In what areas does patient struggle / problems for patient: struggles w severe anxiety when trying to enter [facility] for appointments, "I sit in my car and cry, sometimes I cannot go in", "I dont want to go to any groups"  Discharge Plan:   Does patient have access to transportation?: Yes (own car, sometimes has issues w lack of gas) Will patient be returning to same living situation after discharge?: Yes Currently receiving community mental health services: Yes (From Whom) ([facility] Hoyle Barr at [facility] for "grief counseling", is prescribed Trazodone and Celexa, feels these have decreased her anxiety) If no, would patient like referral for services when discharged?: Yes (What county?) (would prefer individual therapist) Does patient have financial barriers related to discharge medications?: Yes (referred to provider who can assist, is uninsured) Patient description of barriers related to discharge medications: uninsured  Summary/Recommendations:   Summary and Recommendations (to be completed by the evaluator): Patient is a 46 year old female, admitted voluntarily and diagnosed w.  Had fall down concrete steps approx one week  ago, was seen in ED and sustained head contusion, 3 broken vertebrae, bruises.  Per patient she fell again 5 days ago while getting into high bed. After falls, pt states she began to experience increasingly severe hallucinations and delusions. No prior history of AVH or delusions.  Significant grief after unexpected death of husband approx one year ago, states this has improved w medications.  Lives w mother and is her caregiver.  Two children live in the area and are supportive.  Not currently employed, would like to return to school for CNA.  Denies symptoms of mental health concerns other than hallucinations/delusions which began after recent fall.  Regular use of beer (3 12 oz/day w friends).  Current w Tamela Gammon for medications management, would like referral for therapy.  Patient will benefit from hospitalization for crisis stabilization, medication management, group psychotherapy and discharge case management.   Beverely Pace. 02/15/2016

## 2016-02-15 NOTE — Progress Notes (Signed)
DAR NOTE: Patient presents with anxious affect and depressed mood.  Denies pain, auditory and visual hallucinations.  Described energy level as normal and concentration.  Rates depression at 0, hopelessness at 0, and anxiety at 0.  Maintained on routine safety checks.  Medications given as prescribed.  Support and encouragement offered as needed.  Attended group and participated.  States goal for today is "to talk to doctor about my situation."  Patient observed socializing with peers in the dayroom.  Offered no complaint.

## 2016-02-15 NOTE — BHH Suicide Risk Assessment (Signed)
Pt daughter's requested to visit early afternoon due to daughter's night work schedule preventing her from visiting during normal hours.  MD agreeable, RN agreeable, family notified of 30 minute visit after 1 PM today.  Edwyna Shell, LCSW Lead Clinical Social Worker Phone:  [phone]

## 2016-02-15 NOTE — BHH Group Notes (Signed)
[facility] Group Notes:  (Counselor/Nursing/MHT/Case Management/Adjunct)  02/15/2016 1:15PM  Type of Therapy:  Group Therapy  Participation Level:  Active  Participation Quality:  Appropriate  Affect:  Flat  Cognitive:  Oriented  Insight:  Improving  Engagement in Group:  Limited  Engagement in Therapy:  Limited  Modes of Intervention:  Discussion, Exploration and Socialization  Summary of Progress/Problems: The topic for group was balance in life.  Pt participated in the discussion about when their life was in balance and out of balance and how this feels.  Pt discussed ways to get back in balance and short term goals they can work on to get where they want to be. Invited.  Was visiting with daughter-did not attend.   Roque Lias B 02/15/2016 2:28 PM
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
## 2016-02-15 NOTE — Progress Notes (Signed)
Patient ID: Kayla Sandoval, female   DOB: [birth year], 46 y.o.   MRN: [id number] D: Client visible on the unit, seen in dayroom watching TV. Client reports "ready to go home" Denies SHI, AVH. Laceration to rt. Forehead, scant amount of dry blood and bright red blood.Client reports she mistakenly hit head.  A: Writer provided emotional support, encouraged client to speak with physician in the morning about discharge plan. Medications reviewed, administered as ordered. Notified on call Gwenyth Bender. FNP, received orders for wound care. Laceration cleansed with NS and nonadherent dressing applied.  Staff will monitor q44min for safety. R: Client is safe on the unit, did not attend karaoke.

## 2016-02-15 NOTE — BHH Suicide Risk Assessment (Signed)
[facility] Admission Suicide Risk Assessment   Nursing information obtained from:  Patient Demographic factors:  Divorced or widowed, Caucasian, Unemployed Current Mental Status:  NA Loss Factors:  Loss of significant relationship, Decline in physical health, Financial problems / change in socioeconomic status Historical Factors:  Prior suicide attempts Risk Reduction Factors:  Sense of responsibility to family, Living with another person, especially a relative, Positive social support  Total Time spent with patient: 30 minutes Principal Problem: Substance-induced psychotic disorder with hallucinations ([place]) Diagnosis:   Patient Active Problem List   Diagnosis Date Noted  . Alcohol use disorder, moderate, dependence ([place]) [F10.20] 02/15/2016  . Substance-induced psychotic disorder with hallucinations ([place]) [F19.951] 02/15/2016  . History of head injury [Z87.828] 02/15/2016  . History of spinal fracture [Z87.81] 02/15/2016  . Feeling grief [F43.20]   . MDD (major depressive disorder), recurrent severe, without psychosis ([place]) [F33.2] 04/08/2015  . Acute stress disorder [F43.0] 04/08/2015  . Insomnia [G47.00] 04/08/2015  . Recent bereavement [Z63.4] 04/07/2015  . Alcohol withdrawal ([place]) [F10.239] 04/06/2015  . CAP (community acquired pneumonia) [J18.9] 04/03/2015  . Overdose [T50.901A] 04/03/2015  . Acute encephalopathy [G93.40] 04/03/2015  . Acute respiratory failure with hypoxia ([place]) [J96.01] 04/03/2015  . Alcohol intoxication ([place]) [F10.929] 04/03/2015  . Tobacco use disorder [F17.200] 04/03/2015  . Hemothorax on left [J94.2] 04/09/2011  . Pleural effusion [J90] 03/27/2011  . Pneumonia [J18.9] 03/27/2011  . Tobacco abuse [Z72.0] 03/27/2011  . Hypertension [I10] 03/27/2011  . Macrocytosis without anemia [D75.89] 03/27/2011  . Depression [F32.9] 03/27/2011   Subjective Data: Patient tearful, reports sadness , alcohol issues , recent health issues - had head injury - lacerations  healing on her scalp, also had spinal fracture - pt reports she may have had a bad reaction to oxycodone. Pt also with severe alcohol abuse - will start CIWA/refer to substance abuse treatment program.  Continued Clinical Symptoms:  Alcohol Use Disorder Identification Test Final Score (AUDIT): 15 The "Alcohol Use Disorders Identification Test", Guidelines for Use in Primary Care, Second Edition.  World Pharmacologist [facility]). Score between 0-7:  no or low risk or alcohol related problems. Score between 8-15:  moderate risk of alcohol related problems. Score between 16-19:  high risk of alcohol related problems. Score 20 or above:  warrants further diagnostic evaluation for alcohol dependence and treatment.   CLINICAL FACTORS:   Depression:   Comorbid alcohol abuse/dependence Impulsivity Alcohol/Substance Abuse/Dependencies Previous Psychiatric Diagnoses and Treatments   Musculoskeletal: Strength & Muscle Tone: within normal limits Gait & Station: normal Patient leans: N/A  Psychiatric Specialty Exam: Physical Exam  Review of Systems  Psychiatric/Behavioral: Positive for depression and substance abuse. The patient is nervous/anxious.   All other systems reviewed and are negative.   Blood pressure (!) 131/91, pulse 98, temperature 98 F (36.7 C), temperature source Oral, resp. rate 18, height 5\' 2"  (1.575 m), weight 60.3 kg (133 lb).Body mass index is 24.33 kg/m.  General Appearance: Casual  Eye Contact:  Fair  Speech:  Clear and Coherent  Volume:  Normal  Mood:  Anxious and Depressed  Affect:  Appropriate  Thought Process:  Goal Directed and Descriptions of Associations: Circumstantial  Orientation:  Full (Time, Place, and Person)  Thought Content:  Logical  Suicidal Thoughts:  No  Homicidal Thoughts:  No  Memory:  Immediate;   Fair Recent;   Fair Remote;   Fair  Judgement:  Impaired  Insight:  Fair  Psychomotor Activity:  Normal  Concentration:  Concentration:  Fair and Attention Span: Fair  Recall:  Smiley Houseman of Knowledge:  Fair  Language:  Fair  Akathisia:  No  Handed:  Right  AIMS (if indicated):     Assets:  Communication Skills Desire for Improvement Social Support  ADL's:  Intact  Cognition:  WNL  Sleep:  Number of Hours: 4.5      COGNITIVE FEATURES THAT CONTRIBUTE TO RISK:  Closed-mindedness, Polarized thinking and Thought constriction (tunnel vision)    SUICIDE RISK:   Moderate:  Frequent suicidal ideation with limited intensity, and duration, some specificity in terms of plans, no associated intent, good self-control, limited dysphoria/symptomatology, some risk factors present, and identifiable protective factors, including available and accessible social support.   PLAN OF CARE: Patient had couple of oxycodone pills for pain and also had alcohol- started hallucinating. Per mother who spoke to RN - she does not abuse oxycodone and never had AH/VH before.  Pt currently tearful, grieving - her husband of 22 years passed away in his sleep in May 09, 2022 last year , nearing his death anniversary.  Please see H&P per NP.   I certify that inpatient services furnished can reasonably be expected to improve the patient's condition.  Calla Wedekind, MD 02/15/2016, 11:58 AM
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
## 2016-02-15 NOTE — Plan of Care (Signed)
Problem: Safety: Goal: Periods of time without injury will increase Outcome: Progressing Client is safe on the unit AEB q74min safety checks.

## 2016-02-15 NOTE — B[facility] Suicide Risk Assessment (Signed)
[name] INPATIENT:  Family/Significant Other Suicide Prevention Education  Suicide Prevention Education:  Education Completed; Kayla Sandoval, mother [phone],  (name of family member/significant other) has been identified by the patient as the family member/significant other with whom the patient will be residing, and identified as the person(s) who will aid the patient in the event of a mental health crisis (suicidal ideations/suicide attempt).  With written consent from the patient, the family member/significant other has been provided the following suicide prevention education, prior to the and/or following the discharge of the patient.  The suicide prevention education provided includes the following:  Suicide risk factors  Suicide prevention and interventions  National Suicide Hotline telephone number  [facility] assessment telephone number  [facility] Emergency Assistance [place] and/or Residential Mobile Crisis Unit telephone number  Request made of family/significant other to:  Remove weapons (e.g., guns, rifles, knives), all items previously/currently identified as safety concern.    Remove drugs/medications (over-the-counter, prescriptions, illicit drugs), all items previously/currently identified as a safety concern.  The family member/significant other verbalizes understanding of the suicide prevention education information provided.  The family member/significant other agrees to remove the items of safety concern listed above.  Lives w mother who is PhD psychologist.  Per mother, pt has no history of mental health issues.  No prior AVH or delusions - all symptoms began after fall one week ago (02/09/16).  "It was really awful, had hole in head 1.5 inches deep."  Pt slept w mother one night after fall, "she was awake most of the night jabbering away."  Is normally up and around, interacts well with others, not depressed.  Has been seen for "a little  depression and anxiety" since death of husband last year.  Mother believes patient needed hospitalization due to hallucinations/delusions but wants evaluation of head trauma due to concern about this as a cause of current symptoms.    Kayla Sandoval 02/15/2016, 9:50 AM

## 2016-02-15 NOTE — Tx Team (Signed)
Interdisciplinary Treatment and Diagnostic Plan Update  02/15/2016 Time of Session: 4:22 PM  Kayla Sandoval MRN: [id number]  Principal Diagnosis: Substance-induced psychotic disorder with hallucinations ([place])  Secondary Diagnoses: Principal Problem:   Substance-induced psychotic disorder with hallucinations ([place]) Active Problems:   Tobacco use disorder   MDD (major depressive disorder), recurrent severe, without psychosis ([place])   Alcohol use disorder, moderate, dependence ([place])   History of head injury   History of spinal fracture   Current Medications:  Current Facility-Administered Medications  Medication Dose Route Frequency Provider Last Rate Last Dose  . acetaminophen (TYLENOL) tablet 650 mg  650 mg Oral Q6H PRN Ethelene Hal, NP   650 mg at 02/15/16 1413  . alum & mag hydroxide-simeth (MAALOX/MYLANTA) 200-200-20 MG/5ML suspension 30 mL  30 mL Oral Q4H PRN Ethelene Hal, NP      . benztropine (COGENTIN) tablet 0.5 mg  0.5 mg Oral Q8H PRN Ursula Alert, MD       Or  . benztropine mesylate (COGENTIN) injection 0.5 mg  0.5 mg Intramuscular Q8H PRN Ursula Alert, MD      . citalopram (CELEXA) tablet 10 mg  10 mg Oral Daily Ethelene Hal, NP   10 mg at 02/15/16 0753  . dicyclomine (BENTYL) tablet 20 mg  20 mg Oral Q6H PRN Ethelene Hal, NP      . folic acid (FOLVITE) tablet 1 mg  1 mg Oral Daily Ursula Alert, MD   1 mg at 02/15/16 1251  . haloperidol (HALDOL) tablet 5 mg  5 mg Oral Q8H PRN Ursula Alert, MD       Or  . haloperidol lactate (HALDOL) injection 5 mg  5 mg Intramuscular Q8H PRN Ursula Alert, MD      . hydrOXYzine (ATARAX/VISTARIL) tablet 25 mg  25 mg Oral Q6H PRN Ursula Alert, MD      . lisinopril (PRINIVIL,ZESTRIL) tablet 10 mg  10 mg Oral Daily Saramma Eappen, MD      . loperamide (IMODIUM) capsule 2-4 mg  2-4 mg Oral PRN Saramma Eappen, MD      . LORazepam (ATIVAN) tablet 1 mg  1 mg Oral Q6H PRN Saramma Eappen, MD      . LORazepam  (ATIVAN) tablet 1 mg  1 mg Oral QID Ursula Alert, MD   1 mg at 02/15/16 1253   Followed by  . [START ON 02/16/2016] LORazepam (ATIVAN) tablet 1 mg  1 mg Oral TID Ursula Alert, MD       Followed by  . [START ON 02/17/2016] LORazepam (ATIVAN) tablet 1 mg  1 mg Oral BID Ursula Alert, MD       Followed by  . [START ON 02/19/2016] LORazepam (ATIVAN) tablet 1 mg  1 mg Oral Daily Saramma Eappen, MD      . magnesium hydroxide (MILK OF MAGNESIA) suspension 30 mL  30 mL Oral Daily PRN Ethelene Hal, NP      . methocarbamol (ROBAXIN) tablet 500 mg  500 mg Oral Q8H PRN Ethelene Hal, NP      . multivitamin with minerals tablet 1 tablet  1 tablet Oral Daily Ursula Alert, MD   1 tablet at 02/15/16 1251  . naproxen (NAPROSYN) tablet 500 mg  500 mg Oral BID PRN Ethelene Hal, NP   500 mg at 02/15/16 4098  . nicotine (NICODERM CQ - dosed in mg/24 hours) patch 14 mg  14 mg Transdermal Daily Ursula Alert, MD   14 mg at 02/15/16  1251  . ondansetron (ZOFRAN-ODT) disintegrating tablet 4 mg  4 mg Oral Q6H PRN Ursula Alert, MD      . QUEtiapine (SEROQUEL) tablet 25 mg  25 mg Oral QHS Ursula Alert, MD      . Derrill Memo ON 02/16/2016] thiamine (VITAMIN B-1) tablet 100 mg  100 mg Oral Daily Saramma Eappen, MD      . traZODone (DESYREL) tablet 50 mg  50 mg Oral QHS PRN Ethelene Hal, NP   50 mg at 02/14/16 2343    PTA Medications: Prescriptions Prior to Admission  Medication Sig Dispense Refill Last Dose  . acetaminophen (TYLENOL) 325 MG tablet Take 650 mg by mouth every 6 (six) hours as needed for moderate pain or headache.   unknown at Unknown time  . aspirin EC 325 MG tablet Take 325 mg by mouth daily as needed for moderate pain.   unknown at Unknown time  . citalopram (CELEXA) 10 MG tablet Take 10 mg by mouth daily.   02/13/2016 at Unknown time  . cyclobenzaprine (FLEXERIL) 10 MG tablet Take 1 tablet (10 mg total) by mouth 3 (three) times daily as needed for muscle spasms. 15 tablet 0  02/13/2016 at Unknown time  . HYDROcodone-acetaminophen (NORCO) 5-325 MG tablet Take 1-2 tablets by mouth every 4 (four) hours as needed for severe pain. 15 tablet 0 02/13/2016 at Unknown time  . ibuprofen (ADVIL,MOTRIN) 200 MG tablet Take 800 mg by mouth every 6 (six) hours as needed for headache or moderate pain.   unknown at Unknown time  . traZODone (DESYREL) 50 MG tablet Take 1 tablet (50 mg total) by mouth at bedtime as needed and may repeat dose one time if needed for sleep. 60 tablet 0 Past Week at Unknown time    Treatment Modalities: Medication Management, Group therapy, Case management,  1 to 1 session with clinician, Psychoeducation, Recreational therapy.   Physician Treatment Plan for Primary Diagnosis: Substance-induced psychotic disorder with hallucinations ([place]) Long Term Goal(s): Improvement in symptoms so as ready for discharge  Short Term Goals: Ability to identify changes in lifestyle to reduce recurrence of condition will improve Ability to verbalize feelings will improve Ability to demonstrate self-control will improve Ability to identify and develop effective coping behaviors will improve Ability to maintain clinical measurements within normal limits will improve Compliance with prescribed medications will improve Ability to identify triggers associated with substance abuse/mental health issues will improve Ability to identify changes in lifestyle to reduce recurrence of condition will improve Ability to verbalize feelings will improve Ability to demonstrate self-control will improve Ability to identify and develop effective coping behaviors will improve Ability to maintain clinical measurements within normal limits will improve Compliance with prescribed medications will improve Ability to identify triggers associated with substance abuse/mental health issues will improve  Medication Management: Evaluate patient's response, side effects, and tolerance of medication  regimen.  Therapeutic Interventions: 1 to 1 sessions, Unit Group sessions and Medication administration.  Evaluation of Outcomes: Progressing  Physician Treatment Plan for Secondary Diagnosis: Principal Problem:   Substance-induced psychotic disorder with hallucinations ([place]) Active Problems:   Tobacco use disorder   MDD (major depressive disorder), recurrent severe, without psychosis ([place])   Alcohol use disorder, moderate, dependence ([place])   History of head injury   History of spinal fracture   Long Term Goal(s): Improvement in symptoms so as ready for discharge  Short Term Goals: Ability to identify changes in lifestyle to reduce recurrence of condition will improve Ability to verbalize feelings will  improve Ability to demonstrate self-control will improve Ability to identify and develop effective coping behaviors will improve Ability to maintain clinical measurements within normal limits will improve Compliance with prescribed medications will improve Ability to identify triggers associated with substance abuse/mental health issues will improve Ability to identify changes in lifestyle to reduce recurrence of condition will improve Ability to verbalize feelings will improve Ability to demonstrate self-control will improve Ability to identify and develop effective coping behaviors will improve Ability to maintain clinical measurements within normal limits will improve Compliance with prescribed medications will improve Ability to identify triggers associated with substance abuse/mental health issues will improve  Medication Management: Evaluate patient's response, side effects, and tolerance of medication regimen.  Therapeutic Interventions: 1 to 1 sessions, Unit Group sessions and Medication administration.  Evaluation of Outcomes: Progressing   RN Treatment Plan for Primary Diagnosis: Substance-induced psychotic disorder with hallucinations ([place]) Long Term Goal(s):  Knowledge of disease and therapeutic regimen to maintain health will improve  Short Term Goals: Ability to verbalize feelings will improve and Ability to identify and develop effective coping behaviors will improve  Medication Management: RN will administer medications as ordered by provider, will assess and evaluate patient's response and provide education to patient for prescribed medication. RN will report any adverse and/or side effects to prescribing provider.  Therapeutic Interventions: 1 on 1 counseling sessions, Psychoeducation, Medication administration, Evaluate responses to treatment, Monitor vital signs and CBGs as ordered, Perform/monitor CIWA, COWS, AIMS and Fall Risk screenings as ordered, Perform wound care treatments as ordered.  Evaluation of Outcomes: Progressing   LCSW Treatment Plan for Primary Diagnosis: Substance-induced psychotic disorder with hallucinations ([place]) Long Term Goal(s): Safe transition to appropriate next level of care at discharge, Engage patient in therapeutic group addressing interpersonal concerns.  Short Term Goals: Engage patient in aftercare planning with referrals and resources  Therapeutic Interventions: Assess for all discharge needs, 1 to 1 time with Social worker, Explore available resources and support systems, Assess for adequacy in community support network, Educate family and significant other(s) on suicide prevention, Complete Psychosocial Assessment, Interpersonal group therapy.  Evaluation of Outcomes: Met   Progress in Treatment: Attending groups: No Participating in groups: No Taking medication as prescribed: Yes Toleration medication: Yes, no side effects reported at this time Family/Significant other contact made: Yes Patient understands diagnosis: Yes AEB asking for help with confusion Discussing patient identified problems/goals with staff: Yes Medical problems stabilized or resolved: Yes Denies suicidal/homicidal ideation:  Yes Issues/concerns per patient self-inventory: None Other: N/A  New problem(s) identified: None identified at this time.   New Short Term/Long Term Goal(s): None identified at this time.   Discharge Plan or Barriers: return home, follow up outpt  Reason for Continuation of Hospitalization:  Delusions   Hallucinations  Medical Issues Medication stabilization   Estimated Length of Stay: 3-5 days  Attendees: Patient: 02/15/2016  4:22 PM  Physician: Ursula Alert, MD 02/15/2016  4:22 PM  Nursing: Hoy Register, RN 02/15/2016  4:22 PM  RN Care Manager: Lars Pinks, RN 02/15/2016  4:22 PM  Social Worker: Ripley Fraise 02/15/2016  4:22 PM  Recreational Therapist: Laretta Bolster  02/15/2016  4:22 PM  Other: Norberto Sorenson 02/15/2016  4:22 PM  Other:  02/15/2016  4:22 PM    Scribe for Treatment Team:  Roque Lias LCSW 02/15/2016 4:22 PM

## 2016-02-16 LAB — HEMOGLOBIN A1C
Hgb A1c MFr Bld: 4.5 % — ABNORMAL LOW (ref 4.8–5.6)
Mean Plasma Glucose: 82 mg/dL

## 2016-02-16 MED ORDER — CITALOPRAM HYDROBROMIDE 10 MG PO TABS: 10.0000 mg | ORAL_TABLET | Freq: Every day | ORAL | 0 refills | Status: DC

## 2016-02-16 MED ORDER — HYDROXYZINE HCL 25 MG PO TABS
25.0000 mg | ORAL_TABLET | Freq: Four times a day (QID) | ORAL | 0 refills | Status: DC | PRN
Start: 2016-02-16 — End: 2019-10-12

## 2016-02-16 MED ORDER — NICOTINE 14 MG/24HR TD PT24: 14.0000 mg | MEDICATED_PATCH | Freq: Every day | TRANSDERMAL | 0 refills | Status: DC

## 2016-02-16 MED ORDER — QUETIAPINE FUMARATE 25 MG PO TABS: 25.0000 mg | ORAL_TABLET | Freq: Every day | ORAL | 0 refills | Status: DC

## 2016-02-16 MED ORDER — TRAZODONE HCL 50 MG PO TABS: 50.0000 mg | ORAL_TABLET | Freq: Every evening | ORAL | 0 refills | Status: DC | PRN

## 2016-02-16 MED ORDER — LISINOPRIL 10 MG PO TABS: 10.0000 mg | ORAL_TABLET | Freq: Every day | ORAL | 0 refills | Status: DC

## 2016-02-16 NOTE — Progress Notes (Signed)
Laceration to right forehead cleansed with NS and telfa dressing applied.

## 2016-02-16 NOTE — Tx Team (Signed)
Interdisciplinary Treatment and Diagnostic Plan Update  02/16/2016 Time of Session: 10:58 AM  Kayla Sandoval MRN: [id number]  Principal Diagnosis: Substance-induced psychotic disorder with hallucinations ([place])  Secondary Diagnoses: Principal Problem:   Substance-induced psychotic disorder with hallucinations ([place]) Active Problems:   Tobacco use disorder   MDD (major depressive disorder), recurrent severe, without psychosis ([place])   Alcohol use disorder, moderate, dependence ([place])   History of head injury   History of spinal fracture   Current Medications:  Current Facility-Administered Medications  Medication Dose Route Frequency Provider Last Rate Last Dose  . acetaminophen (TYLENOL) tablet 650 mg  650 mg Oral Q6H PRN Ethelene Hal, NP   650 mg at 02/15/16 1413  . alum & mag hydroxide-simeth (MAALOX/MYLANTA) 200-200-20 MG/5ML suspension 30 mL  30 mL Oral Q4H PRN Ethelene Hal, NP      . benztropine (COGENTIN) tablet 0.5 mg  0.5 mg Oral Q8H PRN Ursula Alert, MD       Or  . benztropine mesylate (COGENTIN) injection 0.5 mg  0.5 mg Intramuscular Q8H PRN Ursula Alert, MD      . citalopram (CELEXA) tablet 10 mg  10 mg Oral Daily Ethelene Hal, NP   10 mg at 02/16/16 0814  . dicyclomine (BENTYL) tablet 20 mg  20 mg Oral Q6H PRN Ethelene Hal, NP      . folic acid (FOLVITE) tablet 1 mg  1 mg Oral Daily Ursula Alert, MD   1 mg at 02/16/16 0814  . haloperidol (HALDOL) tablet 5 mg  5 mg Oral Q8H PRN Ursula Alert, MD       Or  . haloperidol lactate (HALDOL) injection 5 mg  5 mg Intramuscular Q8H PRN Ursula Alert, MD      . hydrOXYzine (ATARAX/VISTARIL) tablet 25 mg  25 mg Oral Q6H PRN Saramma Eappen, MD      . lisinopril (PRINIVIL,ZESTRIL) tablet 10 mg  10 mg Oral Daily Ursula Alert, MD   10 mg at 02/16/16 0814  . loperamide (IMODIUM) capsule 2-4 mg  2-4 mg Oral PRN Saramma Eappen, MD      . LORazepam (ATIVAN) tablet 1 mg  1 mg Oral Q6H PRN Saramma  Eappen, MD      . LORazepam (ATIVAN) tablet 1 mg  1 mg Oral TID Ursula Alert, MD       Followed by  . [START ON 02/17/2016] LORazepam (ATIVAN) tablet 1 mg  1 mg Oral BID Ursula Alert, MD       Followed by  . [START ON 02/19/2016] LORazepam (ATIVAN) tablet 1 mg  1 mg Oral Daily Saramma Eappen, MD      . magnesium hydroxide (MILK OF MAGNESIA) suspension 30 mL  30 mL Oral Daily PRN Ethelene Hal, NP      . methocarbamol (ROBAXIN) tablet 500 mg  500 mg Oral Q8H PRN Ethelene Hal, NP   500 mg at 02/15/16 2139  . multivitamin with minerals tablet 1 tablet  1 tablet Oral Daily Ursula Alert, MD   1 tablet at 02/16/16 0814  . naproxen (NAPROSYN) tablet 500 mg  500 mg Oral BID PRN Ethelene Hal, NP   500 mg at 02/16/16 0610  . nicotine (NICODERM CQ - dosed in mg/24 hours) patch 14 mg  14 mg Transdermal Daily Ursula Alert, MD   14 mg at 02/16/16 0814  . ondansetron (ZOFRAN-ODT) disintegrating tablet 4 mg  4 mg Oral Q6H PRN Ursula Alert, MD      .  QUEtiapine (SEROQUEL) tablet 25 mg  25 mg Oral QHS Ursula Alert, MD   25 mg at 02/15/16 2139  . thiamine (VITAMIN B-1) tablet 100 mg  100 mg Oral Daily Ursula Alert, MD   100 mg at 02/16/16 0814  . traZODone (DESYREL) tablet 50 mg  50 mg Oral QHS PRN Ethelene Hal, NP   50 mg at 02/14/16 2343    PTA Medications: Prescriptions Prior to Admission  Medication Sig Dispense Refill Last Dose  . acetaminophen (TYLENOL) 325 MG tablet Take 650 mg by mouth every 6 (six) hours as needed for moderate pain or headache.   unknown at Unknown time  . aspirin EC 325 MG tablet Take 325 mg by mouth daily as needed for moderate pain.   unknown at Unknown time  . citalopram (CELEXA) 10 MG tablet Take 10 mg by mouth daily.   02/13/2016 at Unknown time  . cyclobenzaprine (FLEXERIL) 10 MG tablet Take 1 tablet (10 mg total) by mouth 3 (three) times daily as needed for muscle spasms. 15 tablet 0 02/13/2016 at Unknown time  . HYDROcodone-acetaminophen  (NORCO) 5-325 MG tablet Take 1-2 tablets by mouth every 4 (four) hours as needed for severe pain. 15 tablet 0 02/13/2016 at Unknown time  . ibuprofen (ADVIL,MOTRIN) 200 MG tablet Take 800 mg by mouth every 6 (six) hours as needed for headache or moderate pain.   unknown at Unknown time  . traZODone (DESYREL) 50 MG tablet Take 1 tablet (50 mg total) by mouth at bedtime as needed and may repeat dose one time if needed for sleep. 60 tablet 0 Past Week at Unknown time    Treatment Modalities: Medication Management, Group therapy, Case management,  1 to 1 session with clinician, Psychoeducation, Recreational therapy.   Physician Treatment Plan for Primary Diagnosis: Substance-induced psychotic disorder with hallucinations ([place]) Long Term Goal(s): Improvement in symptoms so as ready for discharge  Short Term Goals: Ability to identify changes in lifestyle to reduce recurrence of condition will improve Ability to verbalize feelings will improve Ability to demonstrate self-control will improve Ability to identify and develop effective coping behaviors will improve Ability to maintain clinical measurements within normal limits will improve Compliance with prescribed medications will improve Ability to identify triggers associated with substance abuse/mental health issues will improve Ability to identify changes in lifestyle to reduce recurrence of condition will improve Ability to verbalize feelings will improve Ability to demonstrate self-control will improve Ability to identify and develop effective coping behaviors will improve Ability to maintain clinical measurements within normal limits will improve Compliance with prescribed medications will improve Ability to identify triggers associated with substance abuse/mental health issues will improve  Medication Management: Evaluate patient's response, side effects, and tolerance of medication regimen.  Therapeutic Interventions: 1 to 1 sessions,  Unit Group sessions and Medication administration.  Evaluation of Outcomes: Adequate for Discharge  Physician Treatment Plan for Secondary Diagnosis: Principal Problem:   Substance-induced psychotic disorder with hallucinations ([place]) Active Problems:   Tobacco use disorder   MDD (major depressive disorder), recurrent severe, without psychosis ([place])   Alcohol use disorder, moderate, dependence ([place])   History of head injury   History of spinal fracture   Long Term Goal(s): Improvement in symptoms so as ready for discharge  Short Term Goals: Ability to identify changes in lifestyle to reduce recurrence of condition will improve Ability to verbalize feelings will improve Ability to demonstrate self-control will improve Ability to identify and develop effective coping behaviors will improve Ability to  maintain clinical measurements within normal limits will improve Compliance with prescribed medications will improve Ability to identify triggers associated with substance abuse/mental health issues will improve Ability to identify changes in lifestyle to reduce recurrence of condition will improve Ability to verbalize feelings will improve Ability to demonstrate self-control will improve Ability to identify and develop effective coping behaviors will improve Ability to maintain clinical measurements within normal limits will improve Compliance with prescribed medications will improve Ability to identify triggers associated with substance abuse/mental health issues will improve  Medication Management: Evaluate patient's response, side effects, and tolerance of medication regimen.  Therapeutic Interventions: 1 to 1 sessions, Unit Group sessions and Medication administration.  Evaluation of Outcomes: Adequate for Discharge   RN Treatment Plan for Primary Diagnosis: Substance-induced psychotic disorder with hallucinations ([place]) Long Term Goal(s): Knowledge of disease and therapeutic  regimen to maintain health will improve  Short Term Goals: Ability to verbalize feelings will improve and Ability to identify and develop effective coping behaviors will improve  Medication Management: RN will administer medications as ordered by provider, will assess and evaluate patient's response and provide education to patient for prescribed medication. RN will report any adverse and/or side effects to prescribing provider.  Therapeutic Interventions: 1 on 1 counseling sessions, Psychoeducation, Medication administration, Evaluate responses to treatment, Monitor vital signs and CBGs as ordered, Perform/monitor CIWA, COWS, AIMS and Fall Risk screenings as ordered, Perform wound care treatments as ordered.  Evaluation of Outcomes: Adequate for Discharge   Recreational Therapy Treatment Plan for Primary Diagnosis: Substance-induced psychotic disorder with hallucinations ([place]) Long Term Goal(s):  LTG- Patient will participate in recreation therapy tx in at least 2 group sessions without prompting from LRT.  Short Term Goals: Patient will be able to identify at least 5 coping skills for admitting dx by conclusion of recreation therapy tx.  Treatment Modalities: Group and Pet Therapy  Therapeutic Interventions: Psychoeducation  Evaluation of Outcomes: Adequate for Discharge   LCSW Treatment Plan for Primary Diagnosis: Substance-induced psychotic disorder with hallucinations ([place]) Long Term Goal(s): Safe transition to appropriate next level of care at discharge, Engage patient in therapeutic group addressing interpersonal concerns.  Short Term Goals: Engage patient in aftercare planning with referrals and resources  Therapeutic Interventions: Assess for all discharge needs, 1 to 1 time with Social worker, Explore available resources and support systems, Assess for adequacy in community support network, Educate family and significant other(s) on suicide prevention, Complete Psychosocial  Assessment, Interpersonal group therapy.  Evaluation of Outcomes: Met   Progress in Treatment: Attending groups: No Participating in groups: No Taking medication as prescribed: Yes Toleration medication: Yes, no side effects reported at this time Family/Significant other contact made: Yes Patient understands diagnosis: Yes AEB asking for help with confusion Discussing patient identified problems/goals with staff: Yes Medical problems stabilized or resolved: Yes Denies suicidal/homicidal ideation: Yes Issues/concerns per patient self-inventory: None Other: N/A  New problem(s) identified: None identified at this time.   New Short Term/Long Term Goal(s): None identified at this time.   Discharge Plan or Barriers: return home, follow up outpt  Reason for Continuation of Hospitalization:     Estimated Length of Stay: D/C today  Attendees: Patient: 02/16/2016  10:58 AM  Physician: Ursula Alert, MD 02/16/2016  10:58 AM  Nursing: Hoy Register, RN 02/16/2016  10:58 AM  RN Care Manager: Lars Pinks, RN 02/16/2016  10:58 AM  Social Worker: Ripley Fraise 02/16/2016  10:58 AM  Recreational Therapist: Laretta Bolster  02/16/2016  10:58 AM  Other: Norberto Sorenson  02/16/2016  10:58 AM  Other:  02/16/2016  10:58 AM    Scribe for Treatment Team:  Roque Lias LCSW 02/16/2016 10:58 AM

## 2016-02-16 NOTE — Plan of Care (Signed)
Problem: [facility] Participation in Recreation Therapeutic Interventions Goal: STG-Patient will identify at least five coping skills for ** STG: Coping Skills - Patient will be able to identify at least 5 coping skills for delusions by conclusion of recreation therapy tx  Outcome: Adequate for Discharge Pt was able to identify coping skills at completion of stress management and leisure education recreation therapy sessions.  Kayla Sandoval, LRT/CTRS

## 2016-02-16 NOTE — BHH Suicide Risk Assessment (Signed)
[facility] Discharge Suicide Risk Assessment   Principal Problem: Substance-induced psychotic disorder with hallucinations [facility]) Discharge Diagnoses:  Patient Active Problem List   Diagnosis Date Noted  . Alcohol use disorder, moderate, dependence ([place]) [F10.20] 02/15/2016  . Substance-induced psychotic disorder with hallucinations ([place]) [F19.951] 02/15/2016  . History of head injury [Z87.828] 02/15/2016  . History of spinal fracture [Z87.81] 02/15/2016  . Feeling grief [F43.20]   . MDD (major depressive disorder), recurrent severe, without psychosis ([place]) [F33.2] 04/08/2015  . Acute stress disorder [F43.0] 04/08/2015  . Insomnia [G47.00] 04/08/2015  . Recent bereavement [Z63.4] 04/07/2015  . Alcohol withdrawal ([place]) [F10.239] 04/06/2015  . CAP (community acquired pneumonia) [J18.9] 04/03/2015  . Overdose [T50.901A] 04/03/2015  . Acute encephalopathy [G93.40] 04/03/2015  . Acute respiratory failure with hypoxia ([place]) [J96.01] 04/03/2015  . Alcohol intoxication ([place]) [F10.929] 04/03/2015  . Tobacco use disorder [F17.200] 04/03/2015  . Hemothorax on left [J94.2] 04/09/2011  . Pleural effusion [J90] 03/27/2011  . Pneumonia [J18.9] 03/27/2011  . Tobacco abuse [Z72.0] 03/27/2011  . Hypertension [I10] 03/27/2011  . Macrocytosis without anemia [D75.89] 03/27/2011  . Depression [F32.9] 03/27/2011    Total Time spent with patient: 30 minutes  Musculoskeletal: Strength & Muscle Tone: within normal limits Gait & Station: normal Patient leans: N/A  Psychiatric Specialty Exam: Review of Systems  Psychiatric/Behavioral: Positive for substance abuse. Negative for depression, hallucinations and suicidal ideas. The patient is not nervous/anxious.   All other systems reviewed and are negative.   Blood pressure 122/84, pulse (!) 129, temperature 98.7 F (37.1 C), temperature source Oral, resp. rate 16, height 5\' 2"  (1.575 m), weight 60.3 kg (133 lb).Body mass index is 24.33 kg/m.  General  Appearance: Fairly Groomed  Engineer, water::  Fair  Speech:  Normal [id number]  Volume:  Normal  Mood:  Euthymic  Affect:  Appropriate  Thought Process:  Goal Directed and Descriptions of Associations: Intact  Orientation:  Full (Time, Place, and Person)  Thought Content:  Logical  Suicidal Thoughts:  No  Homicidal Thoughts:  No  Memory:  Immediate;   Fair Recent;   Fair Remote;   Fair  Judgement:  Fair  Insight:  Fair  Psychomotor Activity:  Normal  Concentration:  Fair  Recall:  [employer] of Knowledge:Fair  Language: Fair  Akathisia:  No  Handed:  Right  AIMS (if indicated):     Assets:  Communication Skills Desire for Improvement  Sleep:  Number of Hours: 6.5  Cognition: WNL  ADL's:  Intact   Mental Status Per Nursing Assessment::   On Admission:  NA  Demographic Factors:  Caucasian  Loss Factors: NA  Historical Factors: Impulsivity  Risk Reduction Factors:   Living with another person, especially a relative, Positive social support and Positive therapeutic relationship  Continued Clinical Symptoms:  Alcohol/Substance Abuse/Dependencies Previous Psychiatric Diagnoses and Treatments  Cognitive Features That Contribute To Risk:  None    Suicide Risk:  Minimal: No identifiable suicidal ideation.  Patients presenting with no risk factors but with morbid ruminations; may be classified as minimal risk based on the severity of the depressive symptoms  Follow-up Information    [facility] Follow up.   Why:  Patient current w medications management w this provider Contact information: [place] [address] [place] Follow up on 02/26/2016.   Why:  Initial appointment for therapy on Monday 1/15 at 10:30 AM.  Please call to cancel/reschedule if needed.  Arrive  20 minutes early for paperwork.  Bring photo ID and proof of any income you receive.   Contact information: [address]  [place] [address] [phone]           Plan Of Care/Follow-up recommendations:  Activity:  no restrictions Diet:  regular Other:  follow up with aftercare as scheduled, follow up with PMD  Trinty Marken, MD 02/16/2016, 10:14 AM

## 2016-02-16 NOTE — Progress Notes (Signed)
Recreation Therapy Notes  Date: 02/16/16 Time: 1000 Location: 500 Hall Dayroom  Group Topic: Stress Management  Goal Area(s) Addresses:  Patient will verbalize importance of using healthy stress management.  Patient will identify positive emotions associated with healthy stress management.   Behavioral Response: Engaged  Intervention: Stress Management  Activity :  Progressive Muscle Relaxation, [facility].  LRT introduced the stress management techniques of progressive muscle relaxation and guided imagery to patients.  LRT read scripts to allow patients to engage and participate in the techniques.  Patients were to follow along as LRT read the scripts to participate.    Education:  Stress Management, Discharge Planning.   Education Outcome: Acknowledges edcuation/In group clarification offered/Needs additional education  Clinical Observations/Feedback:  Pt stated one symptom of stress was "sweat".  Pt stated she liked the techniques because they made her feel "relaxed".   Victorino Sparrow, LRT/CTRS       Victorino Sparrow A 02/16/2016 12:10 PM

## 2016-02-16 NOTE — Discharge Summary (Signed)
Physician Discharge Summary Note  Patient:  LAGUITA BROMBERG is an 46 y.o., female MRN:  [id number] DOB:  [birth year] Patient phone:  [phone] (home)  Patient address:   [place] [address],  Total Time spent with patient: 30 minutes  Date of Admission:  02/14/2016 Date of Discharge: 02/16/2016  Reason for Admission:   Auditory and visual hallucinations  Principal Problem: Substance-induced psychotic disorder with hallucinations [facility]) Discharge Diagnoses: Patient Active Problem List   Diagnosis Date Noted  . Alcohol use disorder, moderate, dependence ([place]) [F10.20] 02/15/2016  . Substance-induced psychotic disorder with hallucinations ([place]) [F19.951] 02/15/2016  . History of head injury [Z87.828] 02/15/2016  . History of spinal fracture [Z87.81] 02/15/2016  . Feeling grief [F43.20]   . MDD (major depressive disorder), recurrent severe, without psychosis ([place]) [F33.2] 04/08/2015  . Acute stress disorder [F43.0] 04/08/2015  . Insomnia [G47.00] 04/08/2015  . Recent bereavement [Z63.4] 04/07/2015  . Alcohol withdrawal ([place]) [F10.239] 04/06/2015  . CAP (community acquired pneumonia) [J18.9] 04/03/2015  . Overdose [T50.901A] 04/03/2015  . Acute encephalopathy [G93.40] 04/03/2015  . Acute respiratory failure with hypoxia ([place]) [J96.01] 04/03/2015  . Alcohol intoxication ([place]) [F10.929] 04/03/2015  . Tobacco use disorder [F17.200] 04/03/2015  . Hemothorax on left [J94.2] 04/09/2011  . Pleural effusion [J90] 03/27/2011  . Pneumonia [J18.9] 03/27/2011  . Tobacco abuse [Z72.0] 03/27/2011  . Hypertension [I10] 03/27/2011  . Macrocytosis without anemia [D75.89] 03/27/2011  . Depression [F32.9] 03/27/2011    Past Psychiatric History:  See HPI  Past Medical History:  Past Medical History:  Diagnosis Date  . Anxiety   . Depression   . Hypertension     Past Surgical History:  Procedure Laterality Date  . chest thoracostomy     Family History:  Family History   Problem Relation Age of Onset  . Depression Mother   . Hypertension Mother    Family Psychiatric  History:  See HPI Social History:  History  Alcohol Use  . 1.8 oz/week  . 3 Cans of beer per week    Comment: nightly     History  Drug Use  . Types: Marijuana    Social History   Social History  . Marital status: Married    Spouse name: N/A  . Number of children: N/A  . Years of education: N/A   Social History Main Topics  . Smoking status: Current Every Day Smoker    Packs/day: 0.50    Years: 10.00    Types: Cigarettes  . Smokeless tobacco: Never Used  . Alcohol use 1.8 oz/week    3 Cans of beer per week     Comment: nightly  . Drug use:     Types: Marijuana  . Sexual activity: No   Other Topics Concern  . None   Social History Narrative   Smoked "a few cigarettes a day" for many years up until 02/2011. Drinks "a few" drinks of alcohol on weekend, never until drunk or passing out.  Denies illicit drug use.  Stay at home mom.    Hospital Course:   Amour Tippit is a 46 year old female who presented to the [facility] under [facility]C with auditory and visual hallucinations.   Snigdha A Galasso was admitted for Substance-induced psychotic disorder with hallucinations ([place]) and crisis management.  Patient was treated with medications with their indications listed below in detail under Medication List.  Medical problems were identified and treated as needed.  Home medications were restarted as appropriate.  Improvement was monitored  by observation and Shakiyla A Marksberry daily report of symptom reduction.  Emotional and mental status was monitored by daily self inventory reports completed by Fianna A Boggio and clinical staff.  Patient reported continued improvement, denied any new concerns.  Patient had been compliant on medications and denied side effects.  Support and encouragement was provided.    Patient encouraged to attend groups to help with recognizing triggers of emotional crises  and de-stabilizations.  Patient encouraged to attend group to help identify the positive things in life that would help in dealing with feelings of loss, depression and unhealthy or abusive tendencies.         Kailey A Tinnon was evaluated by the treatment team for stability and plans for continued recovery upon discharge.  Patient was offered further treatment options upon discharge including Residential, Intensive Outpatient and Outpatient treatment. Patient will follow up with agency listed below for medication management and counseling.  Encouraged patient to maintain satisfactory support network and home environment.  Advised to adhere to medication compliance and outpatient treatment follow up.  Prescriptions provided.       Brodie A Whilden motivation was an integral factor for scheduling further treatment.  Employment, transportation, bed availability, health status, family support, and any pending legal issues were also considered during patient's hospital stay.  Upon completion of this admission the patient was both mentally and medically stable for discharge denying suicidal/homicidal ideation, auditory/visual/tactile hallucinations, delusional thoughts and paranoia.        Physical Findings: AIMS: Facial and Oral Movements Muscles of Facial Expression: None, normal Lips and Perioral Area: None, normal Jaw: None, normal Tongue: None, normal,Extremity Movements Upper (arms, wrists, hands, fingers): None, normal Lower (legs, knees, ankles, toes): None, normal, Trunk Movements Neck, shoulders, hips: None, normal, Overall Severity Severity of abnormal movements (highest score from questions above): None, normal Incapacitation due to abnormal movements: None, normal Patient's awareness of abnormal movements (rate only patient's report): No Awareness, Dental Status Current problems with teeth and/or dentures?: No Does patient usually wear dentures?: No  CIWA:  CIWA-Ar Total: 1 COWS:      Musculoskeletal: Strength & Muscle Tone: within normal limits Gait & Station: normal Patient leans: N/A  Psychiatric Specialty Exam: Physical Exam  Nursing note and vitals reviewed. Psychiatric: She has a normal mood and affect. Her speech is normal and behavior is normal. Judgment and thought content normal. Cognition and memory are normal.    ROS  Blood pressure 120/82, pulse (!) 108, temperature 97.7 F (36.5 C), temperature source Oral, resp. rate 18, height 5\' 2"  (1.575 m), weight 60.3 kg (133 lb).Body mass index is 24.33 kg/m.   Have you used any form of tobacco in the last 30 days? (Cigarettes, Smokeless Tobacco, Cigars, and/or Pipes): Yes  Has this patient used any form of tobacco in the last 30 days? (Cigarettes, Smokeless Tobacco, Cigars, and/or Pipes) Yes, N/A  Blood Alcohol level:  Lab Results  Component Value Date   ETH <5 02/13/2016   ETH 388 (HH) [id number]    Metabolic Disorder Labs:  Lab Results  Component Value Date   HGBA1C 4.5 (L) 02/15/2016   MPG 82 02/15/2016   No results found for: PROLACTIN Lab Results  Component Value Date   CHOL 170 02/15/2016   TRIG 85 02/15/2016   HDL 57 02/15/2016   CHOLHDL 3.0 02/15/2016   VLDL 17 02/15/2016   LDLCALC 96 02/15/2016    See Psychiatric Specialty Exam and Suicide Risk Assessment completed by Attending Physician prior  to discharge.  Discharge destination:  Home  Is patient on multiple antipsychotic therapies at discharge:  No   Has Patient had three or more failed trials of antipsychotic monotherapy by history:  No  Recommended Plan for Multiple Antipsychotic Therapies: NA   Allergies as of 02/16/2016   No Known Allergies     Medication List    STOP taking these medications   acetaminophen 325 MG tablet Commonly known as:  TYLENOL   aspirin EC 325 MG tablet   cyclobenzaprine 10 MG tablet Commonly known as:  FLEXERIL   HYDROcodone-acetaminophen 5-325 MG tablet Commonly known as:  NORCO    ibuprofen 200 MG tablet Commonly known as:  ADVIL,MOTRIN     TAKE these medications     Indication  citalopram 10 MG tablet Commonly known as:  CELEXA Take 1 tablet (10 mg total) by mouth daily. Start taking on:  02/17/2016  Indication:  Depression   hydrOXYzine 25 MG tablet Commonly known as:  ATARAX/VISTARIL Take 1 tablet (25 mg total) by mouth every 6 (six) hours as needed (anxiety/agitation or CIWA < or = 10).  Indication:  Anxiety Neurosis   lisinopril 10 MG tablet Commonly known as:  PRINIVIL,ZESTRIL Take 1 tablet (10 mg total) by mouth daily. Start taking on:  02/17/2016  Indication:  High Blood Pressure Disorder   nicotine 14 mg/24hr patch Commonly known as:  NICODERM CQ - dosed in mg/24 hours Place 1 patch (14 mg total) onto the skin daily. Start taking on:  02/17/2016  Indication:  Nicotine Addiction   QUEtiapine 25 MG tablet Commonly known as:  SEROQUEL Take 1 tablet (25 mg total) by mouth at bedtime.  Indication:  depression   traZODone 50 MG tablet Commonly known as:  DESYREL Take 1 tablet (50 mg total) by mouth at bedtime as needed for sleep. What changed:  when to take this  Indication:  Trouble Sleeping      Follow-up Information    [facility] Follow up.   Why:  Patient current w medications management w this provider Contact information: [place] [address] [place] Follow up on 02/26/2016.   Why:  Initial appointment for therapy on Monday 1/15 at 10:30 AM.  Please call to cancel/reschedule if needed.  Arrive 20 minutes early for paperwork.  Bring photo ID and proof of any income you receive.   Contact information: [address]  [place] [address] [phone]           Follow-up recommendations:  Activity:  as tol Diet:  as tol  Comments:  1.  Take all your medications as prescribed.   2.  Report any adverse side effects to outpatient provider. 3.  Patient  instructed to not use alcohol or illegal drugs while on prescription medicines. 4.  In the event of worsening symptoms, instructed patient to call 911, the crisis hotline or go to nearest emergency room for evaluation of symptoms.  Signed: Janett Labella, NP [facility] 02/16/2016, 12:29 PM

## 2016-02-16 NOTE — Progress Notes (Signed)
  [facility] Adult Case Management Discharge Plan :  Will you be returning to the same living situation after discharge:  Yes,  home At discharge, do you have transportation home?: Yes,  family Do you have the ability to pay for your medications: Yes,  mental health  Release of information consent forms completed and in the chart;  Patient's signature needed at discharge.  Patient to Follow up at: Follow-up Information    [facility] Follow up.   Why:  Patient current w medications management w this provider Contact information: [place] [address] [place] Follow up on 02/26/2016.   Why:  Initial appointment for therapy on Monday 1/15 at 10:30 AM.  Please call to cancel/reschedule if needed.  Arrive 20 minutes early for paperwork.  Bring photo ID and proof of any income you receive.   Contact information: [address]  [place] [address] [phone]           Next level of care provider has access to [place] and Suicide Prevention discussed: Yes,  yes  Have you used any form of tobacco in the last 30 days? (Cigarettes, Smokeless Tobacco, Cigars, and/or Pipes): Yes  Has patient been referred to the Quitline?: Patient refused referral  Patient has been referred for addiction treatment: Pt. refused referral  Trish Mage 02/16/2016, 10:56 AM

## 2016-02-16 NOTE — Progress Notes (Signed)
Pt d/c home per MD order. Discharge summary reviewed with pt. Pt verbalizes understanding of discharge summary and outpatient follow up. RX and sample medication provided. Pt denies SI/HI/AVH at discharge. Pt signed for personal property and property returned. Pt ambulatory off unit to lobby, pt friend in lobby to pick pt up. Pt discharged home.

## 2016-02-16 NOTE — Progress Notes (Signed)
Pt is discharge ready, pt reports her ride is on the way.

## 2016-02-16 NOTE — Progress Notes (Signed)
DAR NOTE:  Pt behavior cooperative, pleasant on approach. Pt denies SI. Per pt self inventory form pt reports she slept good last night. Pt reports a good appetite, normal energy level, good concentration. Pt rates depression 0/10, hopelessness 0/10, anxiety 0/10- all on 0-10 scale, 10 being the worse. Pt denies withdrawal from drugs or alcohol. Pt reports her goal is "staying positive" and that she will meet her goal by "think good thoughts" Encourgement and support provided. Special checks q 15 mins in place for safety. Will continue to monitor.

## 2016-02-17 LAB — PROLACTIN: PROLACTIN: 9 ng/mL (ref 4.8–23.3)

## 2017-05-27 ENCOUNTER — Emergency Department (HOSPITAL_COMMUNITY)
Admission: EM | Admit: 2017-05-27 | Discharge: 2017-05-27 | Disposition: A | Discharge status: Home / Self Care | Payer: Self-pay | Attending: Emergency Medicine | Admitting: Emergency Medicine

## 2017-05-27 ENCOUNTER — Encounter (HOSPITAL_COMMUNITY): Payer: Self-pay | Admitting: Emergency Medicine

## 2017-05-27 ENCOUNTER — Emergency Department (HOSPITAL_COMMUNITY): Payer: Self-pay

## 2017-05-27 ENCOUNTER — Other Ambulatory Visit: Payer: Self-pay

## 2017-05-27 DIAGNOSIS — M79642 Pain in left hand: Secondary | ICD-10-CM | POA: Insufficient documentation

## 2017-05-27 DIAGNOSIS — Z5321 Procedure and treatment not carried out due to patient leaving prior to being seen by health care provider: Secondary | ICD-10-CM | POA: Insufficient documentation

## 2017-05-27 NOTE — ED Notes (Signed)
Pt not in waiting area at this time.
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
## 2017-05-27 NOTE — ED Notes (Signed)
PT not in ER waiting area at this time. Radiology has attempted multiple times to get x-ray, but pt is not in department.

## 2017-05-27 NOTE — ED Notes (Signed)
Pt not in waiting area or restroom at this time, radiology attempted to get x-ray but pt was not in ED at the time.

## 2017-10-07 ENCOUNTER — Encounter (HOSPITAL_COMMUNITY): Payer: Self-pay

## 2017-10-07 ENCOUNTER — Other Ambulatory Visit: Payer: Self-pay

## 2017-10-07 ENCOUNTER — Emergency Department (HOSPITAL_COMMUNITY)
Admission: EM | Admit: 2017-10-07 | Discharge: 2017-10-07 | Disposition: A | Discharge status: Home / Self Care | Payer: Self-pay | Attending: Emergency Medicine | Admitting: Emergency Medicine

## 2017-10-07 DIAGNOSIS — W57XXXA Bitten or stung by nonvenomous insect and other nonvenomous arthropods, initial encounter: Secondary | ICD-10-CM | POA: Insufficient documentation

## 2017-10-07 DIAGNOSIS — F17210 Nicotine dependence, cigarettes, uncomplicated: Secondary | ICD-10-CM | POA: Insufficient documentation

## 2017-10-07 DIAGNOSIS — Y999 Unspecified external cause status: Secondary | ICD-10-CM | POA: Insufficient documentation

## 2017-10-07 DIAGNOSIS — T148XXA Other injury of unspecified body region, initial encounter: Secondary | ICD-10-CM

## 2017-10-07 DIAGNOSIS — Y92009 Unspecified place in unspecified non-institutional (private) residence as the place of occurrence of the external cause: Secondary | ICD-10-CM | POA: Insufficient documentation

## 2017-10-07 DIAGNOSIS — L089 Local infection of the skin and subcutaneous tissue, unspecified: Secondary | ICD-10-CM | POA: Insufficient documentation

## 2017-10-07 DIAGNOSIS — Z79899 Other long term (current) drug therapy: Secondary | ICD-10-CM | POA: Insufficient documentation

## 2017-10-07 DIAGNOSIS — Y939 Activity, unspecified: Secondary | ICD-10-CM | POA: Insufficient documentation

## 2017-10-07 DIAGNOSIS — I10 Essential (primary) hypertension: Secondary | ICD-10-CM | POA: Insufficient documentation

## 2017-10-07 MED ORDER — CLINDAMYCIN HCL 150 MG PO CAPS: 450.0000 mg | ORAL_CAPSULE | Freq: Three times a day (TID) | ORAL | 0 refills | Status: AC

## 2017-10-07 MED ORDER — LISINOPRIL 10 MG PO TABS: 10.0000 mg | ORAL_TABLET | Freq: Every day | ORAL | 0 refills | Status: DC

## 2017-10-07 MED ORDER — HYDROCORTISONE 2.5 % EX LOTN: TOPICAL_LOTION | Freq: Two times a day (BID) | CUTANEOUS | 0 refills | Status: DC

## 2017-10-07 MED ORDER — PROBIOTIC 250 MG PO CAPS: 1.0000 | ORAL_CAPSULE | Freq: Every day | ORAL | 0 refills | Status: DC
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
## 2017-10-07 NOTE — ED Provider Notes (Addendum)
[facility] EMERGENCY DEPARTMENT Provider Note   CSN: [id number] Arrival date & time: 10/07/17  7510     History   Chief Complaint Chief Complaint  Patient presents with  . Wound Infection    HPI Kayla Sandoval is a 47 y.o. female with history of hypertension, depression, anxiety, alcohol use disorder who presents with diffuse wounds and sores throughout her body.  She reports pain started after being bit by fleas that she had in her house from dogs and cats.  She reports she has treated the fleas with a topical spray.  She has had pain and burning at the site of the wounds.  She reports they started office itchy.  She reports she has high anxiety and has been scratching a lot of them.  She denies any fevers at home.  She also reports some right ear pain and sore throat.  She was also recently treated for conjunctivitis which is cleared up.  HPI  Past Medical History:  Diagnosis Date  . Anxiety   . Depression   . Hypertension     Patient Active Problem List   Diagnosis Date Noted  . Alcohol use disorder, moderate, dependence ([place]) 02/15/2016  . Substance-induced psychotic disorder with hallucinations ([place]) 02/15/2016  . History of head injury 02/15/2016  . History of spinal fracture 02/15/2016  . Feeling grief   . MDD (major depressive disorder), recurrent severe, without psychosis ([place]) 04/08/2015  . Acute stress disorder 04/08/2015  . Insomnia 04/08/2015  . Recent bereavement 04/07/2015  . Alcohol withdrawal ([place]) 04/06/2015  . CAP (community acquired pneumonia) 04/03/2015  . Overdose 04/03/2015  . Acute encephalopathy 04/03/2015  . Acute respiratory failure with hypoxia ([place]) 04/03/2015  . Alcohol intoxication ([place]) 04/03/2015  . Tobacco use disorder 04/03/2015  . Hemothorax on left 04/09/2011  . Pleural effusion 03/27/2011  . Pneumonia 03/27/2011  . Tobacco abuse 03/27/2011  . Hypertension 03/27/2011  . Macrocytosis without anemia 03/27/2011  . Depression  03/27/2011    Past Surgical History:  Procedure Laterality Date  . chest thoracostomy       OB History   None      Home Medications    Prior to Admission medications   Medication Sig Start Date End Date Taking? Authorizing Provider  citalopram (CELEXA) 10 MG tablet Take 1 tablet (10 mg total) by mouth daily. 02/17/16   Kerrie Buffalo, NP  clindamycin (CLEOCIN) 150 MG capsule Take 3 capsules (450 mg total) by mouth 3 (three) times daily for 7 days. 10/07/17 10/14/17  Frederica Kuster, PA-C  hydrocortisone 2.5 % lotion Apply topically 2 (two) times daily. 10/07/17   Frederica Kuster, PA-C  hydrOXYzine (ATARAX/VISTARIL) 25 MG tablet Take 1 tablet (25 mg total) by mouth every 6 (six) hours as needed (anxiety/agitation or CIWA < or = 10). 02/16/16   Kerrie Buffalo, NP  lisinopril (PRINIVIL,ZESTRIL) 10 MG tablet Take 1 tablet (10 mg total) by mouth daily. 10/07/17   Gayathri Futrell, Bea Graff, PA-C  nicotine (NICODERM CQ - DOSED IN MG/24 HOURS) 14 mg/24hr patch Place 1 patch (14 mg total) onto the skin daily. 02/17/16   Kerrie Buffalo, NP  QUEtiapine (SEROQUEL) 25 MG tablet Take 1 tablet (25 mg total) by mouth at bedtime. 02/16/16   Kerrie Buffalo, NP  Saccharomyces boulardii (PROBIOTIC) 250 MG CAPS Take 1 capsule by mouth daily. 10/07/17   Jozlynn Plaia, Bea Graff, PA-C  traZODone (DESYREL) 50 MG tablet Take 1 tablet (50 mg total) by mouth at bedtime as needed for  sleep. 02/16/16   Kerrie Buffalo, NP    Family History Family History  Problem Relation Age of Onset  . Depression Mother   . Hypertension Mother     Social History Social History   Tobacco Use  . Smoking status: Current Every Day Smoker    Packs/day: 0.50    Years: 10.00    Pack years: 5.00    Types: Cigarettes  . Smokeless tobacco: Never Used  Substance Use Topics  . Alcohol use: Yes    Alcohol/week: 3.0 standard drinks    Types: 3 Cans of beer per week    Comment: nightly  . Drug use: Not Currently    Types: Marijuana      Allergies   Patient has no known allergies.   Review of Systems Review of Systems  Constitutional: Negative for fever.  HENT: Positive for ear pain and sore throat.   Skin: Positive for wound.     Physical Exam Updated Vital Signs BP (!) 177/116 (BP Location: Right Arm)   Pulse (!) 112   Temp 98.6 F (37 C) (Oral)   Resp 16   Ht 5\' 4"  (1.626 m)   Wt 59 kg   SpO2 94%   BMI 22.31 kg/m   Physical Exam  Constitutional: She appears well-developed and well-nourished. No distress.  HENT:  Head: Normocephalic and atraumatic.  Right Ear: Tympanic membrane normal.  Left Ear: Tympanic membrane normal.  Mouth/Throat: Oropharynx is clear and moist. No oropharyngeal exudate, posterior oropharyngeal edema, posterior oropharyngeal erythema or tonsillar abscesses.  Eyes: Pupils are equal, round, and reactive to light. Conjunctivae are normal. Right eye exhibits no discharge. Left eye exhibits no discharge. No scleral icterus.  Neck: Normal range of motion. Neck supple. No thyromegaly present.  Cardiovascular: Normal rate, regular rhythm, normal heart sounds and intact distal pulses. Exam reveals no gallop and no friction rub.  No murmur heard. Pulmonary/Chest: Effort normal and breath sounds normal. No stridor. No respiratory distress. She has no wheezes. She has no rales.  Abdominal: Soft. Bowel sounds are normal. She exhibits no distension. There is no tenderness. There is no rebound and no guarding.  Musculoskeletal: She exhibits no edema.  Lymphadenopathy:    She has no cervical adenopathy.  Neurological: She is alert. Coordination normal.  Skin: Skin is warm and dry. No rash noted. She is not diaphoretic. No pallor.  Several wounds some fairly large with surrounding erythema and bloody and purulent drainage; most prominent at left hip, popliteal area of left knee, left axilla  Psychiatric: She has a normal mood and affect.  Nursing note and vitals reviewed.    ED  Treatments / Results  Labs (all labs ordered are listed, but only abnormal results are displayed) Labs Reviewed - No data to display  EKG None  Radiology No results found.  Procedures Procedures (including critical care time)  Medications Ordered in ED Medications - No data to display   Initial Impression / Assessment and Plan / ED Course  I have reviewed the triage vital signs and the nursing notes.  Pertinent labs & imaging results that were available during my care of the patient were reviewed by me and considered in my medical decision making (see chart for details).     Several wounds with signs of localized infection, few vesicles.  Will treat with clindamycin and hydrocortisone cream.  We will also advise Benadryl as needed for itching.  Patient was to follow-up in 2 days for wound check or sooner if  any worsening symptoms develop.  Wound care provided by myself in the ED.  Patient also given refill of lisinopril, as she reports she has not been able to afford it lately.  Her blood pressure was noted to be elevated.  Suspect tachycardia related to anxiety. Patient understands and agrees with plan.  Patient vitals stable throughout ED course and discharged in satisfactory condition. I discussed patient case with Dr. Dayna Barker who guided the patient's management and agrees with plan.   Final Clinical Impressions(s) / ED Diagnoses   Final diagnoses:  Wound infection    ED Discharge Orders         Ordered    clindamycin (CLEOCIN) 150 MG capsule  3 times daily     10/07/17 1000    hydrocortisone 2.5 % lotion  2 times daily     10/07/17 1000    Saccharomyces boulardii (PROBIOTIC) 250 MG CAPS  Daily     10/07/17 1000    lisinopril (PRINIVIL,ZESTRIL) 10 MG tablet  Daily     10/07/17 [address] [place] 10/07/17 1006    Mesner, Corene Cornea, MD 10/07/17 1444

## 2017-10-07 NOTE — Discharge Instructions (Addendum)
Take clindamycin as prescribed until completed.  Take with probiotic daily to help prevent stomach upset.  Apply hydrocortisone lotion to itchy areas twice daily as needed.  Please return in 2 days for recheck or sooner if you develop any increasing pain, redness, swelling, drainage, red streaking from any of the areas, or fever over 100.4.  Please make sure you are taking your blood pressure medication as your blood pressure was elevated today.

## 2018-02-21 ENCOUNTER — Other Ambulatory Visit: Payer: Self-pay

## 2018-02-21 ENCOUNTER — Emergency Department (HOSPITAL_COMMUNITY): Payer: Self-pay

## 2018-02-21 ENCOUNTER — Encounter (HOSPITAL_COMMUNITY): Payer: Self-pay | Admitting: Emergency Medicine

## 2018-02-21 ENCOUNTER — Emergency Department (HOSPITAL_COMMUNITY)
Admission: EM | Admit: 2018-02-21 | Discharge: 2018-02-21 | Disposition: A | Discharge status: Home / Self Care | Payer: Self-pay | Attending: Emergency Medicine | Admitting: Emergency Medicine

## 2018-02-21 DIAGNOSIS — S2242XA Multiple fractures of ribs, left side, initial encounter for closed fracture: Secondary | ICD-10-CM

## 2018-02-21 DIAGNOSIS — F17210 Nicotine dependence, cigarettes, uncomplicated: Secondary | ICD-10-CM | POA: Insufficient documentation

## 2018-02-21 DIAGNOSIS — W231XXA Caught, crushed, jammed, or pinched between stationary objects, initial encounter: Secondary | ICD-10-CM | POA: Insufficient documentation

## 2018-02-21 DIAGNOSIS — I10 Essential (primary) hypertension: Secondary | ICD-10-CM | POA: Insufficient documentation

## 2018-02-21 DIAGNOSIS — Y999 Unspecified external cause status: Secondary | ICD-10-CM | POA: Insufficient documentation

## 2018-02-21 DIAGNOSIS — Y929 Unspecified place or not applicable: Secondary | ICD-10-CM | POA: Insufficient documentation

## 2018-02-21 DIAGNOSIS — Y939 Activity, unspecified: Secondary | ICD-10-CM | POA: Insufficient documentation

## 2018-02-21 DIAGNOSIS — B349 Viral infection, unspecified: Secondary | ICD-10-CM

## 2018-02-21 IMAGING — DX DG RIBS W/ CHEST 3+V*L*
4 series · 4 of 4 positions shown · non-contrast
Comparison: None.

CLINICAL DATA: Left rib pain after crush injury.

EXAM:
LEFT RIBS AND CHEST - 3+ VIEW

[chest pa]
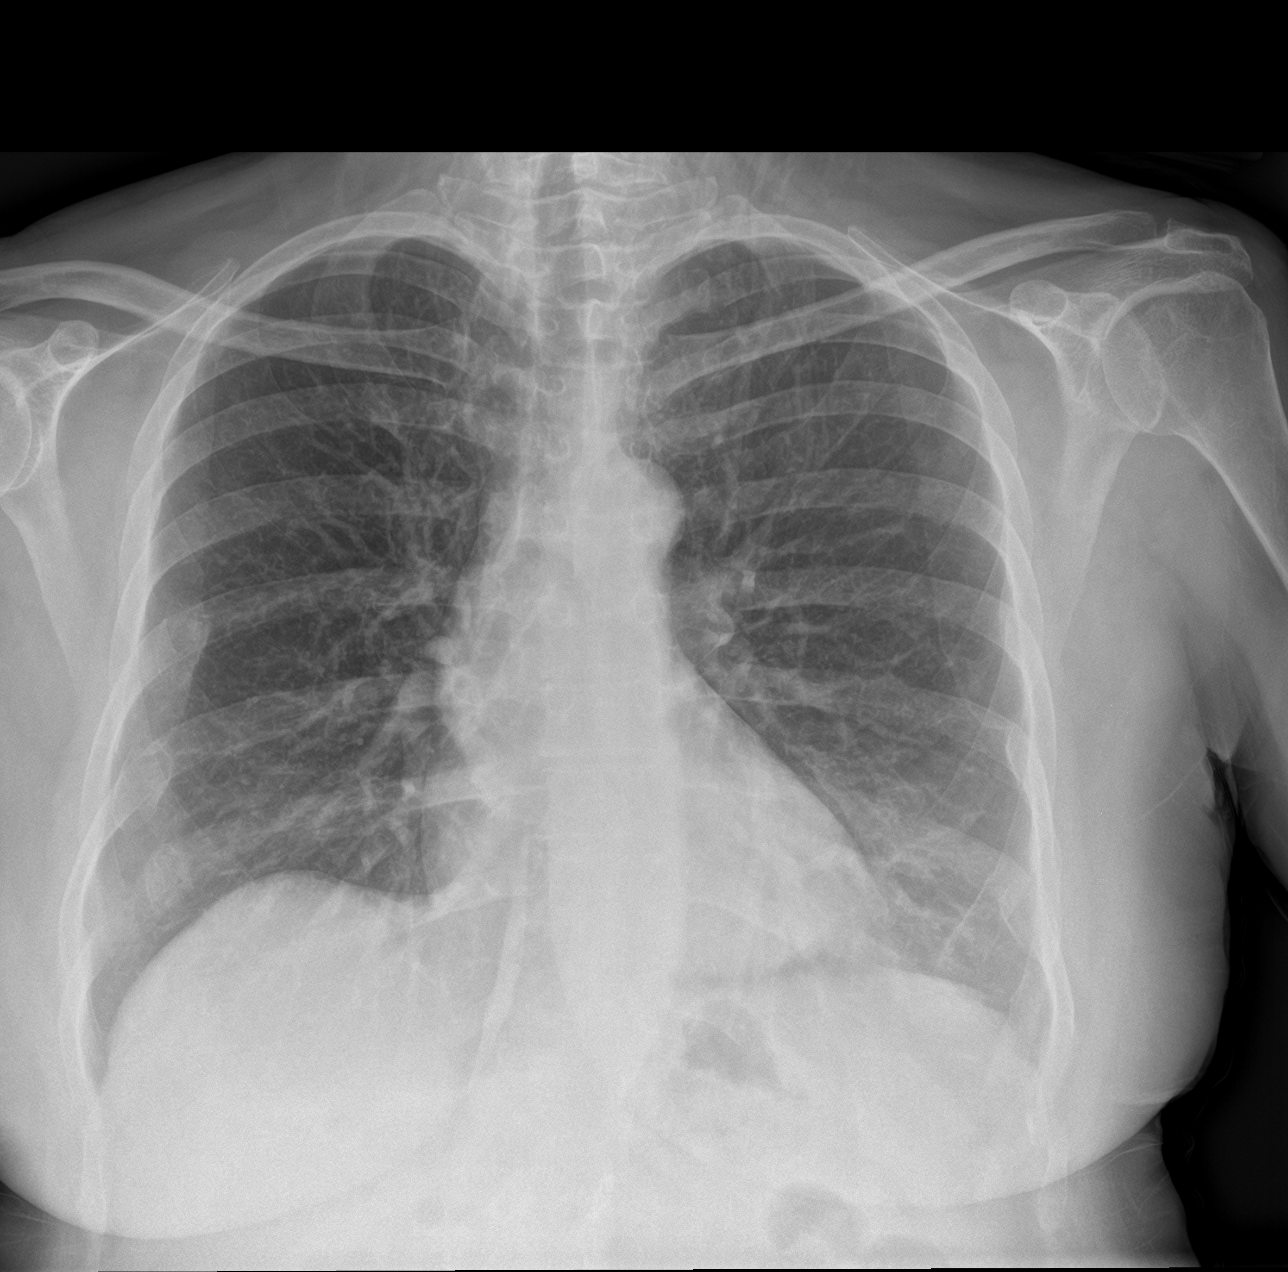

[rib pa obl (1 of 2)]
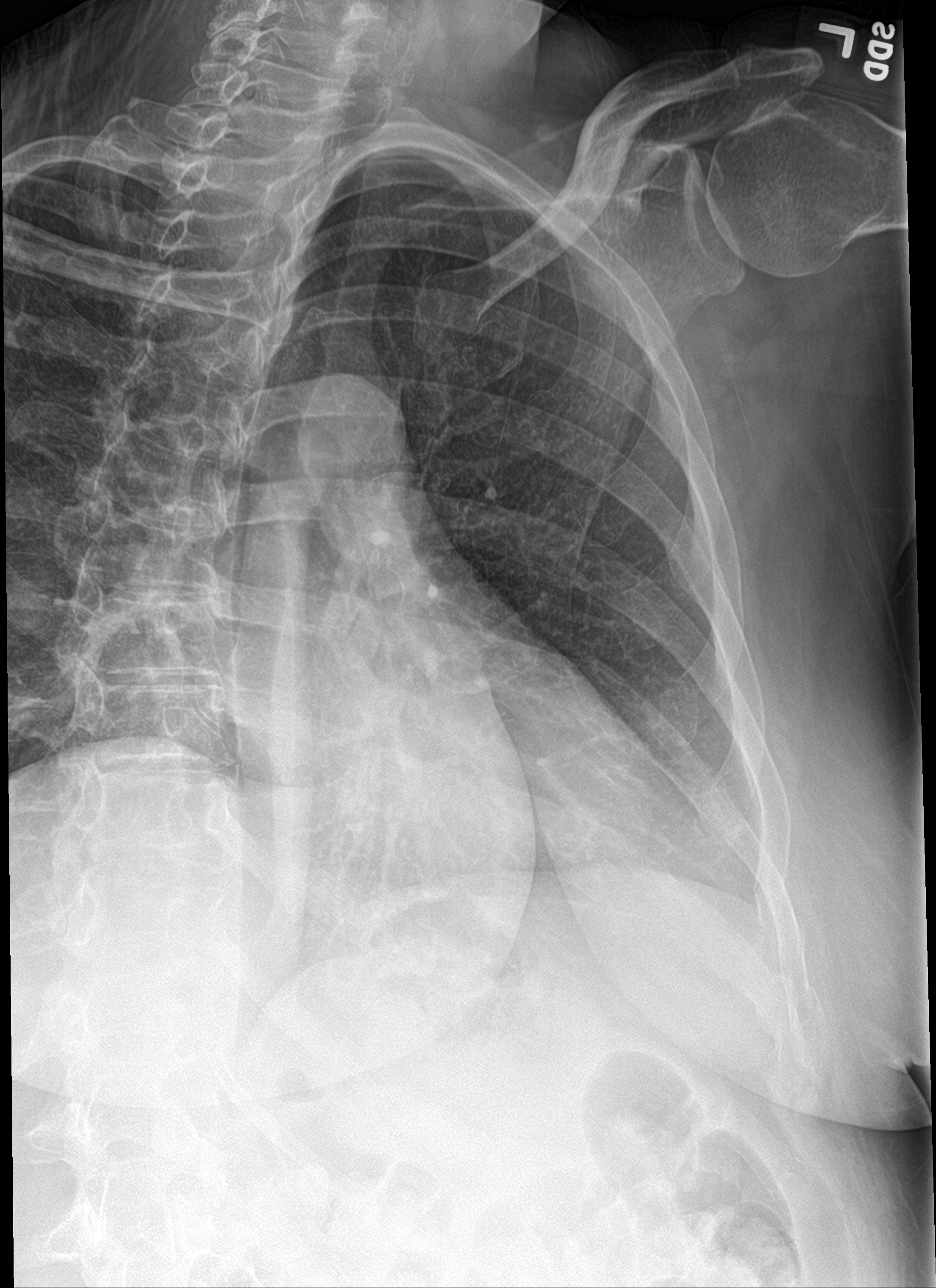

[rib pa obl (2 of 2)]
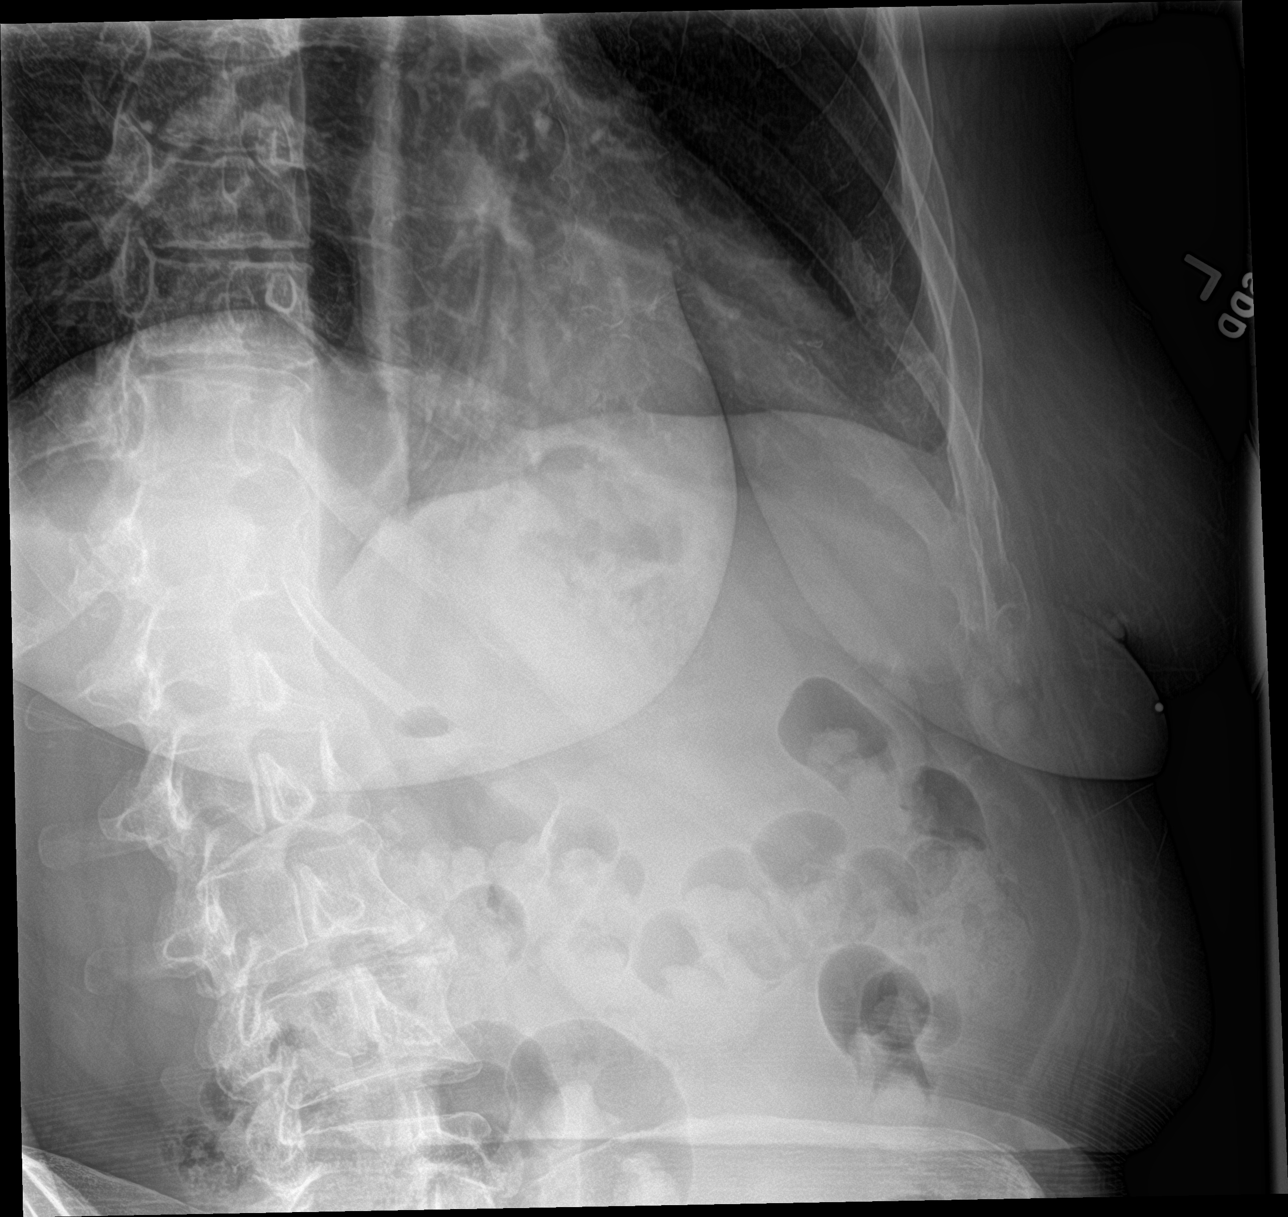

[rib pa]
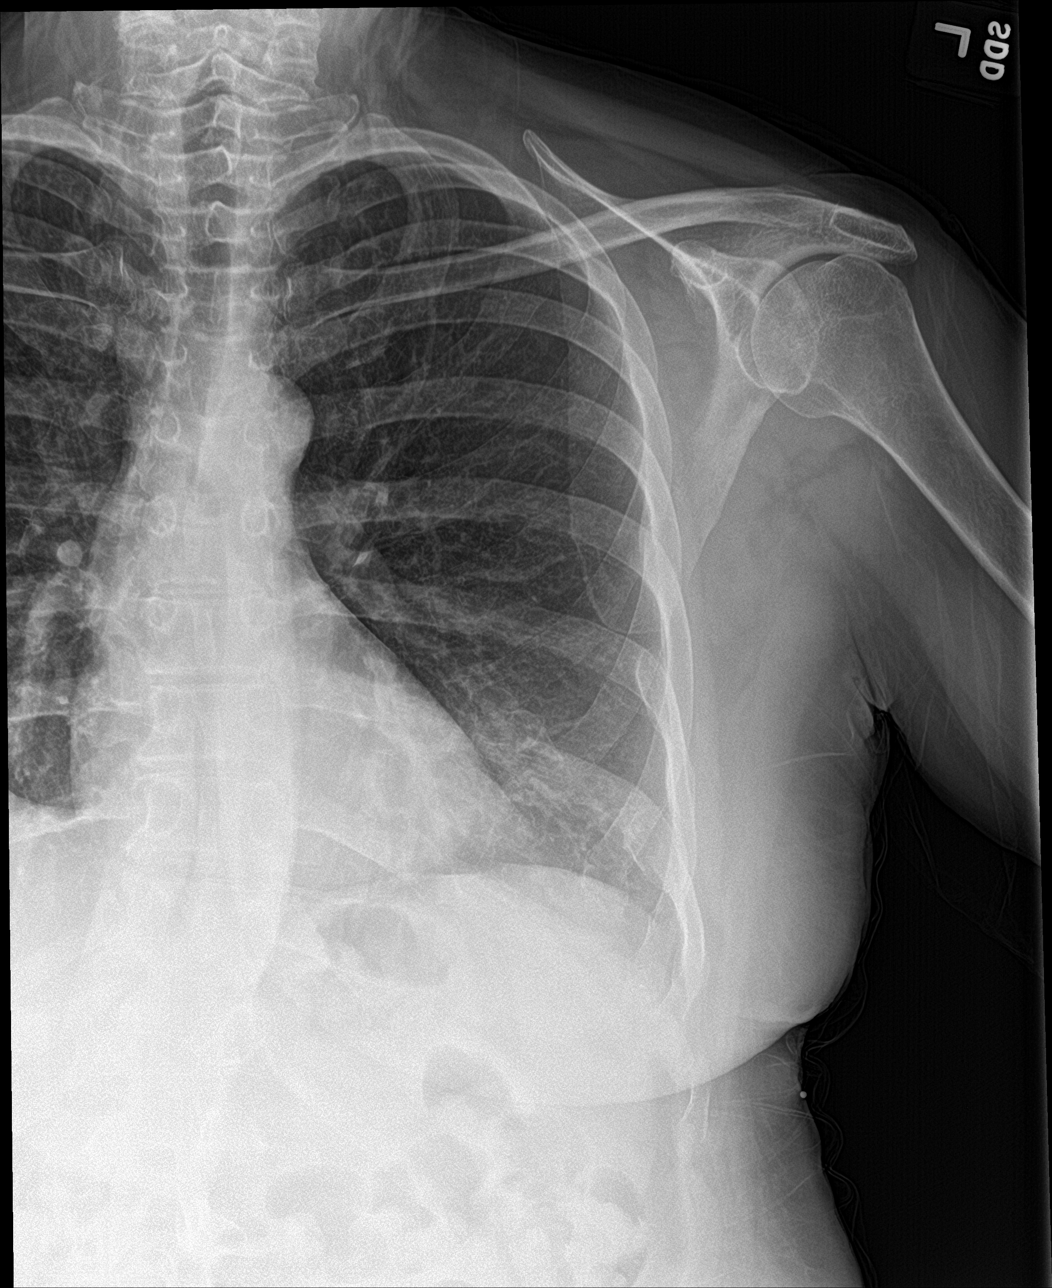

[4 of 4 positions shown; findings below may reference images not displayed]

FINDINGS: Frontal chest shows clear lungs. No pneumothorax or pleural
effusion. The cardiopericardial silhouette is within normal limits
for size. Multiple old right rib fractures evident.

Oblique views of the left ribs show a combination of old and
potentially acute to subacute fractures. The acute to subacute
fractures are noted at the anterior seventh, eighth, ninth, and
tenth ribs.
IMPRESSION: 1. Acute to subacute fractures of the anterior left seventh through
tenth ribs.
2. Multiple old bilateral rib fractures.

## 2018-02-21 MED ORDER — NAPROXEN 500 MG PO TABS: 500.0000 mg | ORAL_TABLET | Freq: Two times a day (BID) | ORAL | 0 refills | Status: DC

## 2018-02-21 MED ORDER — LIDOCAINE 5 % EX PTCH: 1.0000 | MEDICATED_PATCH | CUTANEOUS | 0 refills | Status: DC

## 2018-02-21 MED ORDER — CYCLOBENZAPRINE HCL 10 MG PO TABS: 10.0000 mg | ORAL_TABLET | Freq: Two times a day (BID) | ORAL | 0 refills | Status: DC | PRN

## 2018-02-21 MED ORDER — PSEUDOEPHEDRINE HCL ER 120 MG PO TB12: 120.0000 mg | ORAL_TABLET | Freq: Two times a day (BID) | ORAL | 0 refills | Status: DC
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
## 2018-02-21 NOTE — Discharge Instructions (Signed)
You were evaluated in the Emergency Department and after careful evaluation, we did not find any emergent condition requiring admission or further testing in the hospital.  Your x-ray revealed rib fractures.  Please use the pain medications provided and try to take deep breaths throughout the day to prevent pneumonia.  Your nasal congestion and ear discomfort seems to be related to a virus.  Please use the Sudafed decongestant as needed.  Please return to the Emergency Department if you experience any worsening of your condition.  We encourage you to follow up with a primary care provider.  Thank you for allowing [place] to be a part of your care.
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
## 2018-03-22 ENCOUNTER — Emergency Department (HOSPITAL_COMMUNITY)
Admission: EM | Admit: 2018-03-22 | Discharge: 2018-03-22 | Disposition: A | Discharge status: Home / Self Care | Payer: Self-pay | Attending: Emergency Medicine | Admitting: Emergency Medicine

## 2018-03-22 ENCOUNTER — Other Ambulatory Visit: Payer: Self-pay

## 2018-03-22 ENCOUNTER — Encounter (HOSPITAL_COMMUNITY): Payer: Self-pay | Admitting: Emergency Medicine

## 2018-03-22 DIAGNOSIS — J209 Acute bronchitis, unspecified: Secondary | ICD-10-CM | POA: Insufficient documentation

## 2018-03-22 DIAGNOSIS — Z79899 Other long term (current) drug therapy: Secondary | ICD-10-CM | POA: Insufficient documentation

## 2018-03-22 DIAGNOSIS — F17210 Nicotine dependence, cigarettes, uncomplicated: Secondary | ICD-10-CM | POA: Insufficient documentation

## 2018-03-22 DIAGNOSIS — I10 Essential (primary) hypertension: Secondary | ICD-10-CM | POA: Insufficient documentation

## 2018-03-22 MED ORDER — AZITHROMYCIN 250 MG PO TABS: ORAL_TABLET | ORAL | 0 refills | Status: DC

## 2018-03-22 NOTE — ED Triage Notes (Signed)
Pt states she is having a cough with congestion, runny nose, and skin lesions, and earache.

## 2018-03-22 NOTE — ED Provider Notes (Signed)
[facility] EMERGENCY DEPARTMENT Provider Note   CSN: [id number] Arrival date & time: 03/22/18  9833     History   Chief Complaint Chief Complaint  Patient presents with  . URI    HPI Kayla Sandoval is a 48 y.o. female.  Patient is a 48 year old female with past medical history of anxiety, hypertension, alcohol use.  She presents today for evaluation of chest congestion, cough, and not feeling well.  This is been ongoing for the past 3 weeks.  She reports cough productive of yellow sputum.  She denies ill contacts.  The history is provided by the patient.  URI  Presenting symptoms: congestion, cough and fatigue   Severity:  Moderate Onset quality:  Gradual Duration:  3 weeks Timing:  Constant Progression:  Worsening Chronicity:  New Worsened by:  Nothing Ineffective treatments:  None tried   Past Medical History:  Diagnosis Date  . Anxiety   . Depression   . Hypertension     Patient Active Problem List   Diagnosis Date Noted  . Alcohol use disorder, moderate, dependence ([place]) 02/15/2016  . Substance-induced psychotic disorder with hallucinations ([place]) 02/15/2016  . History of head injury 02/15/2016  . History of spinal fracture 02/15/2016  . Feeling grief   . MDD (major depressive disorder), recurrent severe, without psychosis ([place]) 04/08/2015  . Acute stress disorder 04/08/2015  . Insomnia 04/08/2015  . Recent bereavement 04/07/2015  . Alcohol withdrawal ([place]) 04/06/2015  . CAP (community acquired pneumonia) 04/03/2015  . Overdose 04/03/2015  . Acute encephalopathy 04/03/2015  . Acute respiratory failure with hypoxia ([place]) 04/03/2015  . Alcohol intoxication ([place]) 04/03/2015  . Tobacco use disorder 04/03/2015  . Hemothorax on left 04/09/2011  . Pleural effusion 03/27/2011  . Pneumonia 03/27/2011  . Tobacco abuse 03/27/2011  . Hypertension 03/27/2011  . Macrocytosis without anemia 03/27/2011  . Depression 03/27/2011    Past Surgical History:    Procedure Laterality Date  . chest thoracostomy       OB History   No obstetric history on file.      Home Medications    Prior to Admission medications   Medication Sig Start Date End Date Taking? Authorizing Provider  citalopram (CELEXA) 10 MG tablet Take 1 tablet (10 mg total) by mouth daily. 02/17/16  Yes Kerrie Buffalo, NP  cyclobenzaprine (FLEXERIL) 10 MG tablet Take 1 tablet (10 mg total) by mouth 2 (two) times daily as needed for muscle spasms. 02/21/18  Yes Maudie Flakes, MD  hydrocortisone 2.5 % lotion Apply topically 2 (two) times daily. 10/07/17  Yes Law, Bea Graff, PA-C  hydrOXYzine (ATARAX/VISTARIL) 25 MG tablet Take 1 tablet (25 mg total) by mouth every 6 (six) hours as needed (anxiety/agitation or CIWA < or = 10). 02/16/16  Yes Kerrie Buffalo, NP  lidocaine (LIDODERM) 5 % Place 1 patch onto the skin daily. Remove & Discard patch within 12 hours or as directed by MD 02/21/18  Yes Maudie Flakes, MD  lisinopril (PRINIVIL,ZESTRIL) 10 MG tablet Take 1 tablet (10 mg total) by mouth daily. 10/07/17  Yes Law, Bea Graff, PA-C  naproxen (NAPROSYN) 500 MG tablet Take 1 tablet (500 mg total) by mouth 2 (two) times daily. 02/21/18  Yes Maudie Flakes, MD  nicotine (NICODERM CQ - DOSED IN MG/24 HOURS) 14 mg/24hr patch Place 1 patch (14 mg total) onto the skin daily. 02/17/16  Yes Kerrie Buffalo, NP  pseudoephedrine (SUDAFED 12 HOUR) 120 MG 12 hr tablet Take 1 tablet (120 mg total)  by mouth 2 (two) times daily. 02/21/18  Yes Maudie Flakes, MD  QUEtiapine (SEROQUEL) 25 MG tablet Take 1 tablet (25 mg total) by mouth at bedtime. 02/16/16  Yes Kerrie Buffalo, NP  Saccharomyces boulardii (PROBIOTIC) 250 MG CAPS Take 1 capsule by mouth daily. 10/07/17  Yes Law, Bea Graff, PA-C  traZODone (DESYREL) 50 MG tablet Take 1 tablet (50 mg total) by mouth at bedtime as needed for sleep. 02/16/16  Yes Kerrie Buffalo, NP    Family History Family History  Problem Relation Age of Onset  . Depression  Mother   . Hypertension Mother     Social History Social History   Tobacco Use  . Smoking status: Current Every Day Smoker    Packs/day: 0.50    Years: 10.00    Pack years: 5.00    Types: Cigarettes  . Smokeless tobacco: Never Used  Substance Use Topics  . Alcohol use: Yes    Alcohol/week: 3.0 standard drinks    Types: 3 Cans of beer per week    Comment: nightly  . Drug use: Not Currently    Types: Marijuana     Allergies   Patient has no known allergies.   Review of Systems Review of Systems  Constitutional: Positive for fatigue.  HENT: Positive for congestion.   Respiratory: Positive for cough.   All other systems reviewed and are negative.    Physical Exam Updated Vital Signs BP (!) 180/121 (BP Location: Right Arm)   Pulse (!) 128   Temp 98.2 F (36.8 C) (Oral)   Resp 20   Ht 5\' 4"  (1.626 m)   Wt 60.3 kg   SpO2 97%   BMI 22.83 kg/m   Physical Exam Vitals signs and nursing note reviewed.  Constitutional:      General: She is not in acute distress.    Appearance: She is well-developed. She is not diaphoretic.  HENT:     Head: Normocephalic and atraumatic.     Right Ear: Tympanic membrane normal.     Left Ear: Tympanic membrane normal.  Neck:     Musculoskeletal: Normal range of motion and neck supple.  Cardiovascular:     Rate and Rhythm: Normal rate and regular rhythm.     Heart sounds: No murmur. No friction rub. No gallop.   Pulmonary:     Effort: Pulmonary effort is normal. No respiratory distress.     Breath sounds: Normal breath sounds. No wheezing.  Abdominal:     General: Bowel sounds are normal. There is no distension.     Palpations: Abdomen is soft.     Tenderness: There is no abdominal tenderness.  Musculoskeletal: Normal range of motion.  Skin:    General: Skin is warm and dry.  Neurological:     Mental Status: She is alert and oriented to person, place, and time.      ED Treatments / Results  Labs (all labs ordered are  listed, but only abnormal results are displayed) Labs Reviewed - No data to display  EKG None  Radiology No results found.  Procedures Procedures (including critical care time)  Medications Ordered in ED Medications - No data to display   Initial Impression / Assessment and Plan / ED Course  I have reviewed the triage vital signs and the nursing notes.  Pertinent labs & imaging results that were available during my care of the patient were reviewed by me and considered in my medical decision making (see chart for details).  Patient  presenting with a three-week history of upper respiratory symptoms that are unrelieved with over-the-counter medications and time.  Patient will be treated with antibiotics for a prolonged URI.  She is to follow-up as needed.  Final Clinical Impressions(s) / ED Diagnoses   Final diagnoses:  None    ED Discharge Orders    None       Veryl Speak, MD 03/22/18 [phone]

## 2018-03-22 NOTE — Discharge Instructions (Addendum)
Zithromax as prescribed.  Follow-up with your primary doctor if symptoms or not improving in the next 3 to 4 days, and return to the ER if symptoms significantly worsen or change.

## 2018-12-25 ENCOUNTER — Other Ambulatory Visit: Payer: Self-pay

## 2018-12-25 ENCOUNTER — Encounter (HOSPITAL_COMMUNITY): Payer: Self-pay

## 2018-12-25 ENCOUNTER — Emergency Department (HOSPITAL_COMMUNITY): Payer: Self-pay

## 2018-12-25 ENCOUNTER — Emergency Department (HOSPITAL_COMMUNITY)
Admission: EM | Admit: 2018-12-25 | Discharge: 2018-12-25 | Disposition: A | Discharge status: Home / Self Care | Payer: Self-pay | Attending: Emergency Medicine | Admitting: Emergency Medicine

## 2018-12-25 DIAGNOSIS — Z79899 Other long term (current) drug therapy: Secondary | ICD-10-CM | POA: Insufficient documentation

## 2018-12-25 DIAGNOSIS — F17210 Nicotine dependence, cigarettes, uncomplicated: Secondary | ICD-10-CM | POA: Insufficient documentation

## 2018-12-25 DIAGNOSIS — W010XXD Fall on same level from slipping, tripping and stumbling without subsequent striking against object, subsequent encounter: Secondary | ICD-10-CM | POA: Insufficient documentation

## 2018-12-25 DIAGNOSIS — S20212D Contusion of left front wall of thorax, subsequent encounter: Secondary | ICD-10-CM | POA: Insufficient documentation

## 2018-12-25 DIAGNOSIS — I10 Essential (primary) hypertension: Secondary | ICD-10-CM | POA: Insufficient documentation

## 2018-12-25 IMAGING — DX DG RIBS W/ CHEST 3+V*R*
6 series · 6 of 6 positions shown · non-contrast
Comparison: [DATE]

CLINICAL DATA: Recent fall down [place] 1 week ago with right-sided
chest pain, initial encounter

EXAM:
RIGHT RIBS AND CHEST - 3+ VIEW

[chest pa]
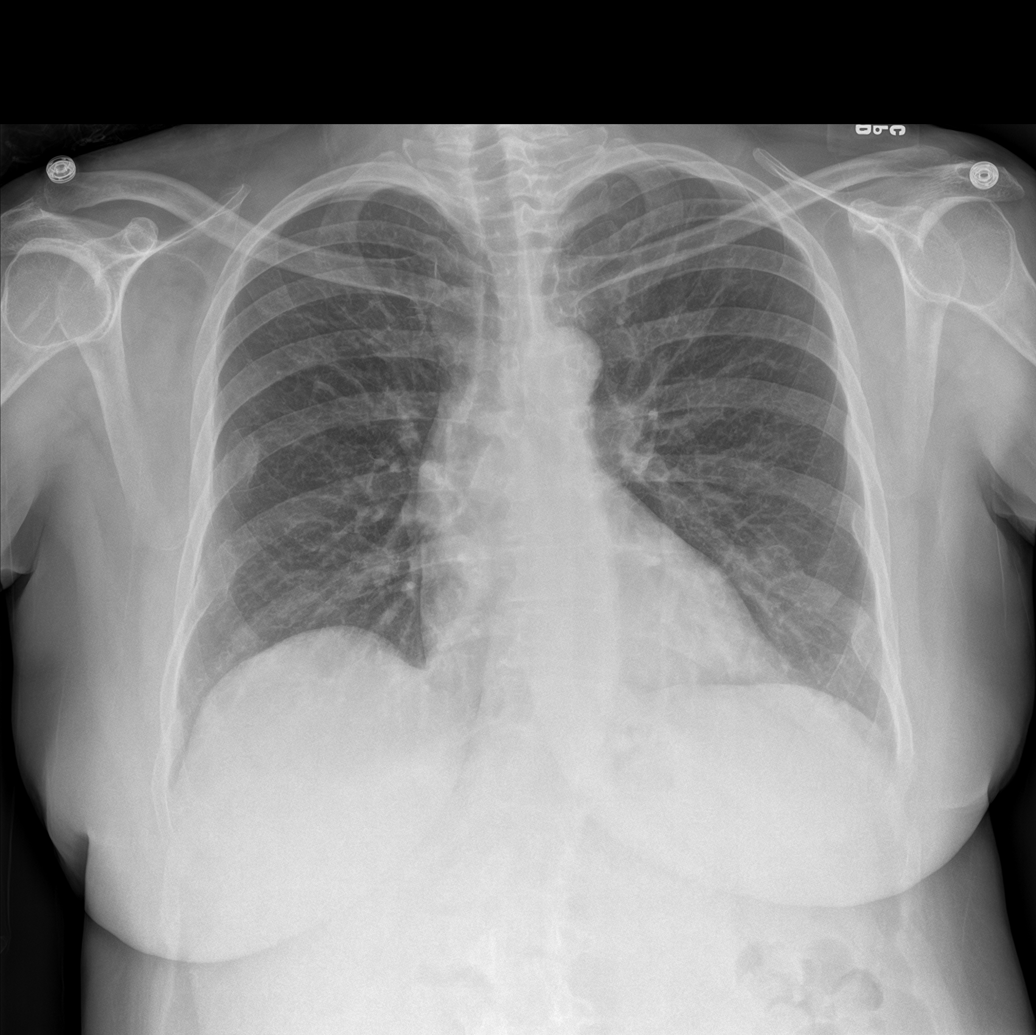

[rib pa (1 of 2)]
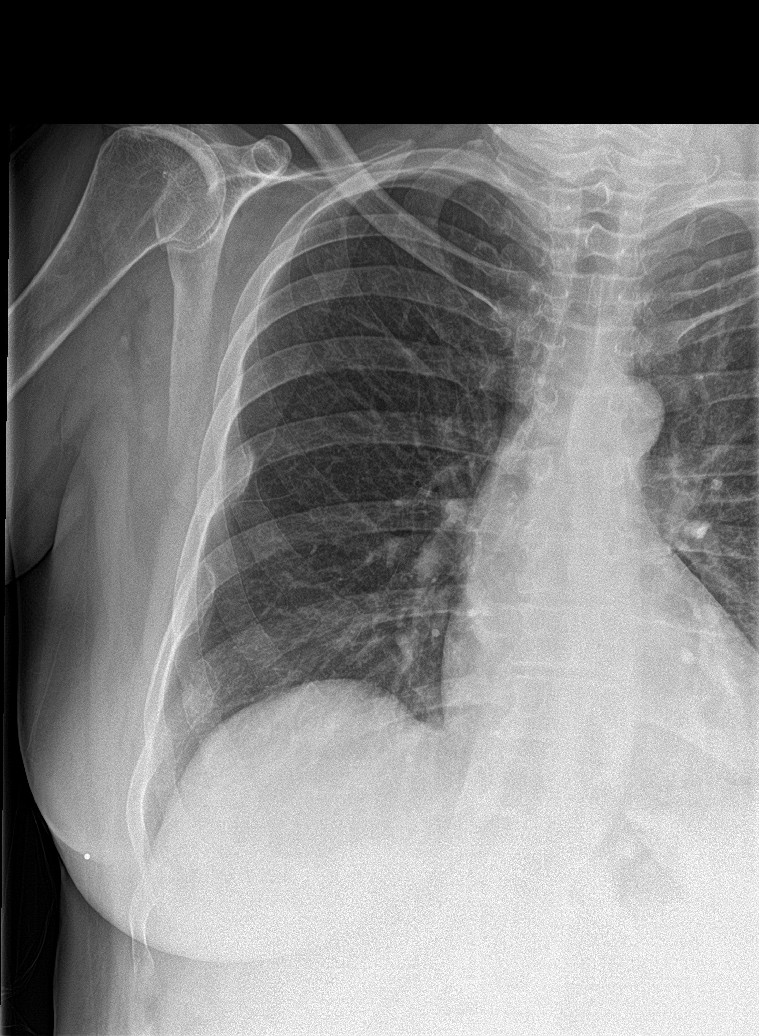

[rib pa obl (1 of 3)]
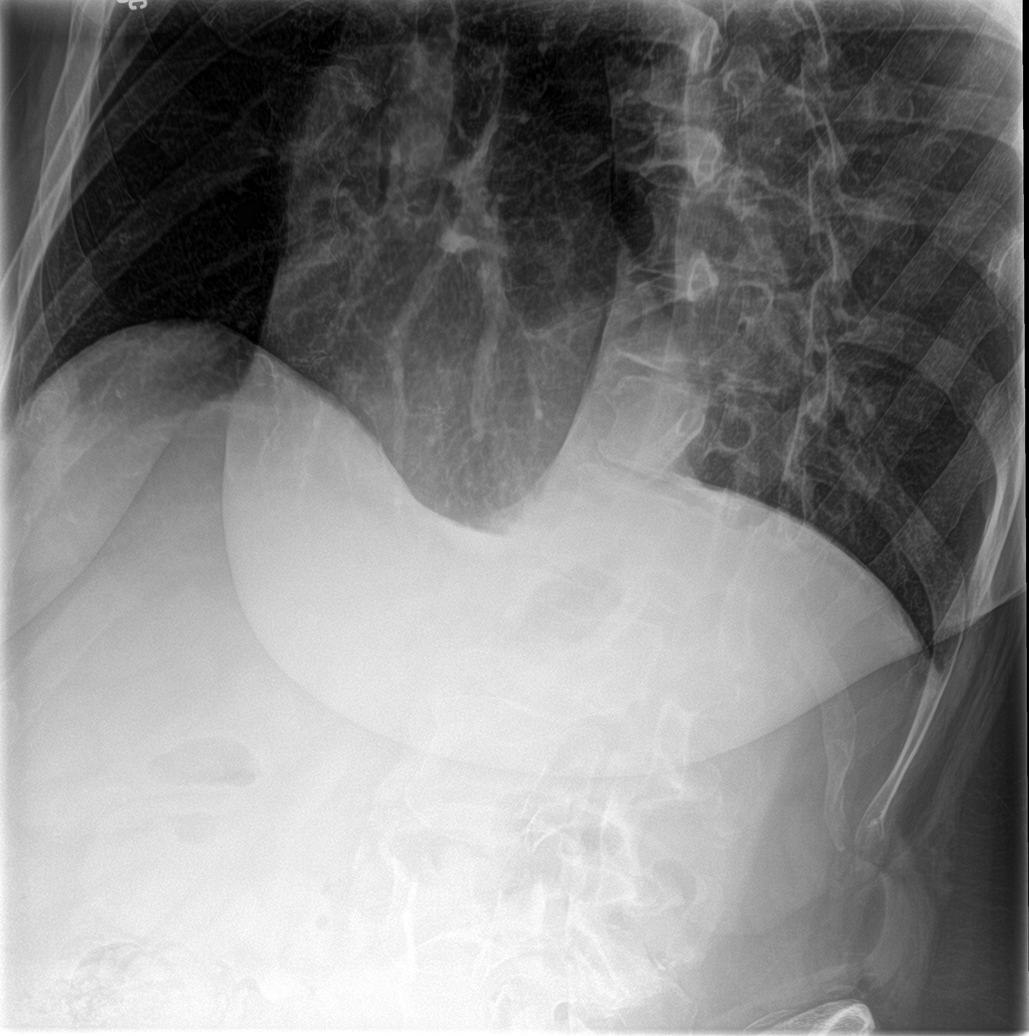

[rib pa obl (2 of 3)]
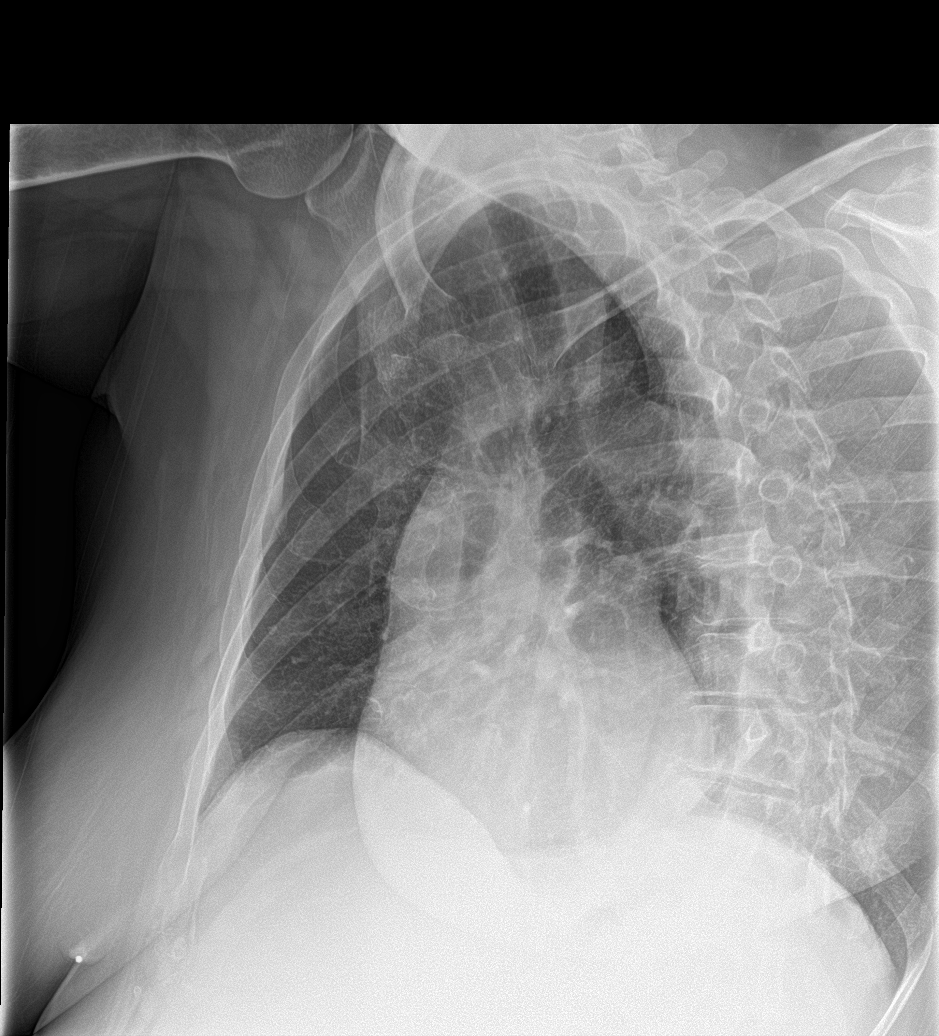

[rib pa (2 of 2)]
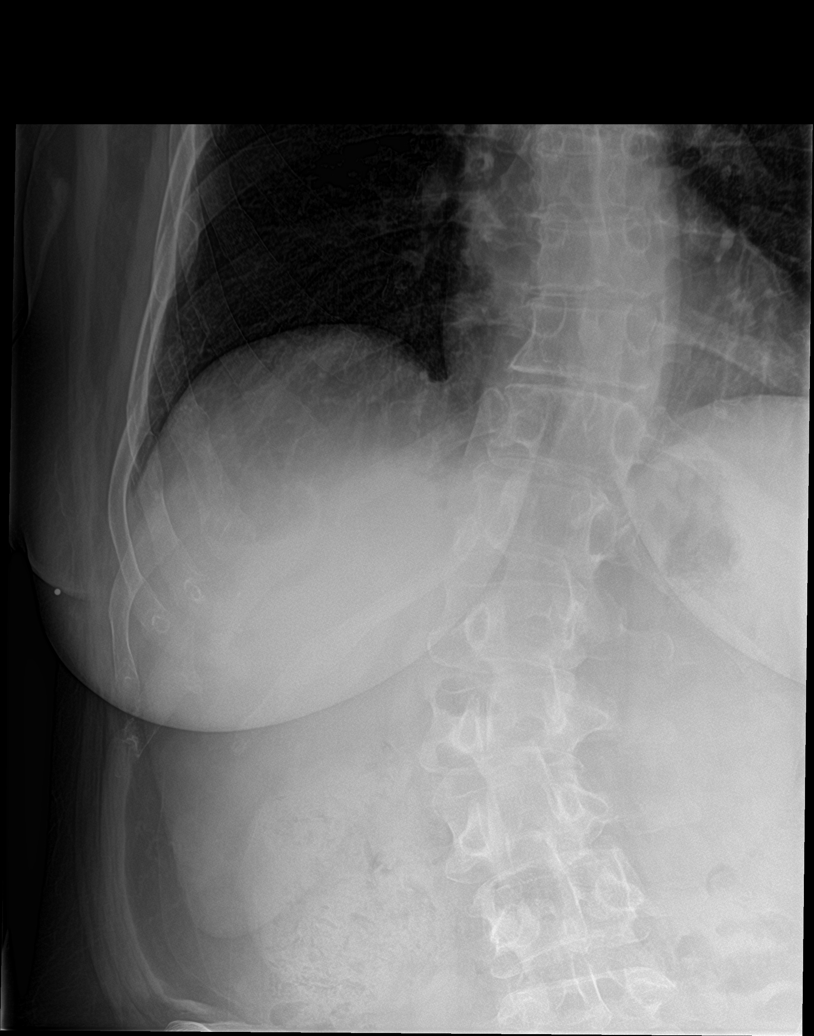

[rib pa obl (3 of 3)]
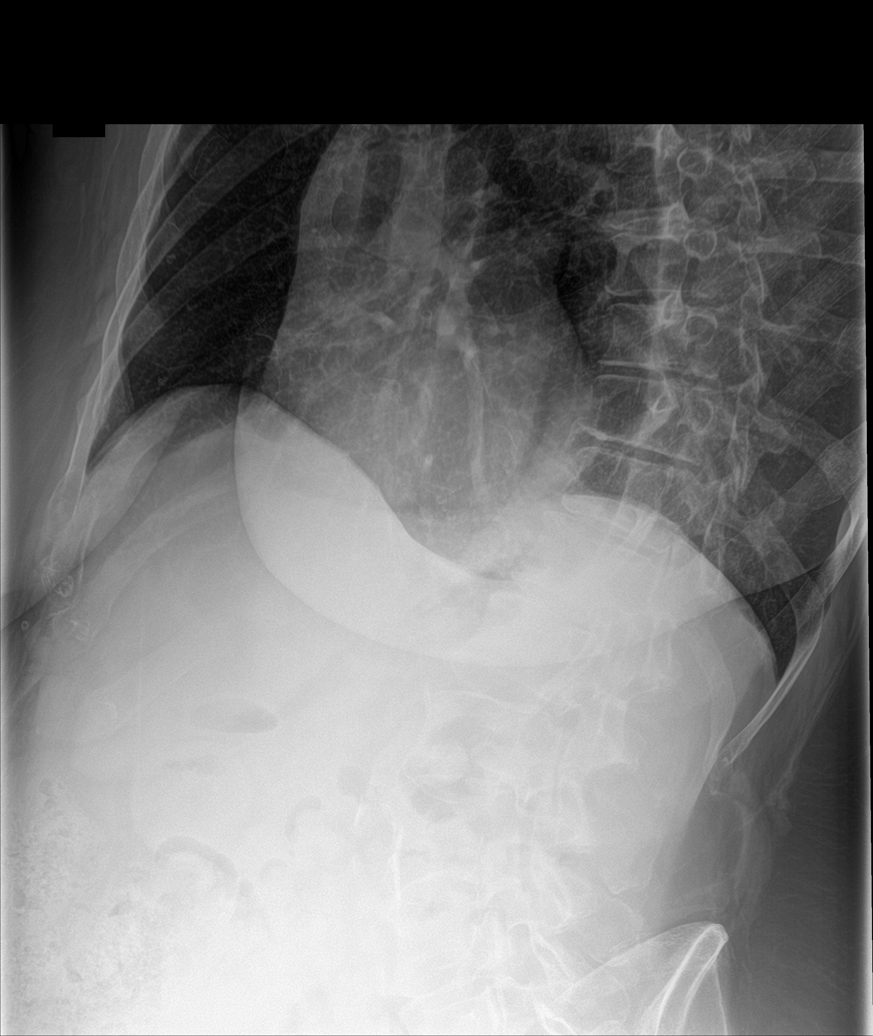

[6 of 6 positions shown; findings below may reference images not displayed]

FINDINGS: Cardiac shadows within normal limits. The lungs are well aerated
bilaterally. Old left rib fractures with healing are noted. Old
healed right rib fractures are noted as well. No acute fracture is
seen. No pneumothorax is noted.
IMPRESSION: Old rib fractures bilaterally.  No acute abnormality noted.

## 2018-12-25 MED ORDER — TRAMADOL HCL 50 MG PO TABS: 50.0000 mg | ORAL_TABLET | Freq: Four times a day (QID) | ORAL | 0 refills | Status: DC | PRN

## 2018-12-25 MED ORDER — IBUPROFEN 800 MG PO TABS: 800.0000 mg | ORAL_TABLET | Freq: Four times a day (QID) | ORAL | 0 refills | Status: DC | PRN

## 2018-12-25 NOTE — ED Triage Notes (Signed)
Dog pulled her down a hill into some brush a week ago.  Pain to right ribcage

## 2018-12-25 NOTE — ED Provider Notes (Signed)
[facility] EMERGENCY DEPARTMENT Provider Note   CSN: [id number] Arrival date & time: 12/25/18  0209     History   Chief Complaint Chief Complaint  Patient presents with  . Fall    Rib Pain    HPI Kayla Sandoval is a 48 y.o. female.     Patient presents to the emergency department for evaluation of right rib injury.  Patient reports that she fell 5 or 6 days ago.  She was walking her dog and the dog pulled her to the ground.  She fell onto her right side.  Patient reports that she has not been able to sleep since the fall because of right rib pain.  Pain worsens with movement, bending, twisting as well as taking deep breaths.     Past Medical History:  Diagnosis Date  . Anxiety   . Depression   . Hypertension     Patient Active Problem List   Diagnosis Date Noted  . Alcohol use disorder, moderate, dependence ([place]) 02/15/2016  . Substance-induced psychotic disorder with hallucinations ([place]) 02/15/2016  . History of head injury 02/15/2016  . History of spinal fracture 02/15/2016  . Feeling grief   . MDD (major depressive disorder), recurrent severe, without psychosis ([place]) 04/08/2015  . Acute stress disorder 04/08/2015  . Insomnia 04/08/2015  . Recent bereavement 04/07/2015  . Alcohol withdrawal ([place]) 04/06/2015  . CAP (community acquired pneumonia) 04/03/2015  . Overdose 04/03/2015  . Acute encephalopathy 04/03/2015  . Acute respiratory failure with hypoxia ([place]) 04/03/2015  . Alcohol intoxication ([place]) 04/03/2015  . Tobacco use disorder 04/03/2015  . Hemothorax on left 04/09/2011  . Pleural effusion 03/27/2011  . Pneumonia 03/27/2011  . Tobacco abuse 03/27/2011  . Hypertension 03/27/2011  . Macrocytosis without anemia 03/27/2011  . Depression 03/27/2011    Past Surgical History:  Procedure Laterality Date  . chest thoracostomy       OB History   No obstetric history on file.      Home Medications    Prior to Admission medications    Medication Sig Start Date End Date Taking? Authorizing Provider  azithromycin (ZITHROMAX Z-PAK) 250 MG tablet 2 po day one, then 1 daily x 4 days 03/22/18   Veryl Speak, MD  citalopram (CELEXA) 10 MG tablet Take 1 tablet (10 mg total) by mouth daily. 02/17/16   Kerrie Buffalo, NP  cyclobenzaprine (FLEXERIL) 10 MG tablet Take 1 tablet (10 mg total) by mouth 2 (two) times daily as needed for muscle spasms. 02/21/18   Maudie Flakes, MD  hydrocortisone 2.5 % lotion Apply topically 2 (two) times daily. 10/07/17   Frederica Kuster, PA-C  hydrOXYzine (ATARAX/VISTARIL) 25 MG tablet Take 1 tablet (25 mg total) by mouth every 6 (six) hours as needed (anxiety/agitation or CIWA < or = 10). 02/16/16   Kerrie Buffalo, NP  ibuprofen (ADVIL) 800 MG tablet Take 1 tablet (800 mg total) by mouth every 6 (six) hours as needed for moderate pain. 12/25/18   Orpah Greek, MD  lidocaine (LIDODERM) 5 % Place 1 patch onto the skin daily. Remove & Discard patch within 12 hours or as directed by MD 02/21/18   Maudie Flakes, MD  lisinopril (PRINIVIL,ZESTRIL) 10 MG tablet Take 1 tablet (10 mg total) by mouth daily. 10/07/17   Law, Bea Graff, PA-C  naproxen (NAPROSYN) 500 MG tablet Take 1 tablet (500 mg total) by mouth 2 (two) times daily. 02/21/18   Maudie Flakes, MD  nicotine (NICODERM CQ -  DOSED IN MG/24 HOURS) 14 mg/24hr patch Place 1 patch (14 mg total) onto the skin daily. 02/17/16   Kerrie Buffalo, NP  pseudoephedrine (SUDAFED 12 HOUR) 120 MG 12 hr tablet Take 1 tablet (120 mg total) by mouth 2 (two) times daily. 02/21/18   Maudie Flakes, MD  QUEtiapine (SEROQUEL) 25 MG tablet Take 1 tablet (25 mg total) by mouth at bedtime. 02/16/16   Kerrie Buffalo, NP  Saccharomyces boulardii (PROBIOTIC) 250 MG CAPS Take 1 capsule by mouth daily. 10/07/17   Law, Bea Graff, PA-C  traMADol (ULTRAM) 50 MG tablet Take 1 tablet (50 mg total) by mouth every 6 (six) hours as needed. 12/25/18   Orpah Greek, MD  traZODone  (DESYREL) 50 MG tablet Take 1 tablet (50 mg total) by mouth at bedtime as needed for sleep. 02/16/16   Kerrie Buffalo, NP    Family History Family History  Problem Relation Age of Onset  . Depression Mother   . Hypertension Mother     Social History Social History   Tobacco Use  . Smoking status: Current Every Day Smoker    Packs/day: 0.50    Years: 10.00    Pack years: 5.00    Types: Cigarettes  . Smokeless tobacco: Never Used  Substance Use Topics  . Alcohol use: Yes    Alcohol/week: 3.0 standard drinks    Types: 3 Cans of beer per week    Comment: nightly  . Drug use: Not Currently    Types: Marijuana     Allergies   Patient has no known allergies.   Review of Systems Review of Systems  Musculoskeletal:       Rib pain  All other systems reviewed and are negative.    Physical Exam Updated Vital Signs BP (!) 158/112 (BP Location: Left Arm)   Pulse (!) 110   Temp 97.7 F (36.5 C) (Oral)   Resp 20   Ht 5\' 4"  (1.626 m)   Wt 61.2 kg   SpO2 98%   BMI 23.17 kg/m   Physical Exam Vitals signs and nursing note reviewed.  Constitutional:      General: She is not in acute distress.    Appearance: Normal appearance. She is well-developed.  HENT:     Head: Normocephalic and atraumatic.     Right Ear: Hearing normal.     Left Ear: Hearing normal.     Nose: Nose normal.  Eyes:     Conjunctiva/sclera: Conjunctivae normal.     Pupils: Pupils are equal, round, and reactive to light.  Neck:     Musculoskeletal: Normal range of motion and neck supple.  Cardiovascular:     Rate and Rhythm: Regular rhythm.     Heart sounds: S1 normal and S2 normal. No murmur. No friction rub. No gallop.   Pulmonary:     Effort: Pulmonary effort is normal. No respiratory distress.     Breath sounds: Normal breath sounds.  Chest:     Chest wall: Tenderness present. No crepitus.    Abdominal:     General: Bowel sounds are normal.     Palpations: Abdomen is soft.      Tenderness: There is no abdominal tenderness. There is no guarding or rebound. Negative signs include Murphy's sign and McBurney's sign.     Hernia: No hernia is present.  Musculoskeletal: Normal range of motion.  Skin:    General: Skin is warm and dry.     Findings: No rash.  Neurological:  Mental Status: She is alert and oriented to person, place, and time.     GCS: GCS eye subscore is 4. GCS verbal subscore is 5. GCS motor subscore is 6.     Cranial Nerves: No cranial nerve deficit.     Sensory: No sensory deficit.     Coordination: Coordination normal.  Psychiatric:        Speech: Speech normal.        Behavior: Behavior normal.        Thought Content: Thought content normal.      ED Treatments / Results  Labs (all labs ordered are listed, but only abnormal results are displayed) Labs Reviewed - No data to display  EKG None  Radiology Dg Ribs Unilateral W/chest Right  Result Date: 12/25/2018 CLINICAL DATA:  Recent fall down hill 1 week ago with right-sided chest pain, initial encounter EXAM: RIGHT RIBS AND CHEST - 3+ VIEW COMPARISON:  02/21/2018 FINDINGS: Cardiac shadows within normal limits. The lungs are well aerated bilaterally. Old left rib fractures with healing are noted. Old healed right rib fractures are noted as well. No acute fracture is seen. No pneumothorax is noted. IMPRESSION: Old rib fractures bilaterally.  No acute abnormality noted. Electronically Signed   By: Inez Catalina M.D.   On: 12/25/2018 03:03    Procedures Procedures (including critical care time)  Medications Ordered in ED Medications - No data to display   Initial Impression / Assessment and Plan / ED Course  I have reviewed the triage vital signs and the nursing notes.  Pertinent labs & imaging results that were available during my care of the patient were reviewed by me and considered in my medical decision making (see chart for details).        Patient presents with right-sided  rib pain that has been present for nearly a week since she fell.  Lungs are clear bilaterally.  Chest x-ray does not show any pulmonary contusion, pneumothorax.  No new displaced rib fracture.  As the fall was a week ago, no need for any other work-up for occult injury.  Treat with analgesia.  Final Clinical Impressions(s) / ED Diagnoses   Final diagnoses:  Chest wall contusion, left, subsequent encounter    ED Discharge Orders         Ordered    traMADol (ULTRAM) 50 MG tablet  Every 6 hours PRN     12/25/18 0332    ibuprofen (ADVIL) 800 MG tablet  Every 6 hours PRN     12/25/18 0332           Orpah Greek, MD 12/25/18 [phone]

## 2019-07-23 ENCOUNTER — Emergency Department (HOSPITAL_COMMUNITY): Payer: Self-pay

## 2019-07-23 ENCOUNTER — Other Ambulatory Visit: Payer: Self-pay

## 2019-07-23 ENCOUNTER — Emergency Department (HOSPITAL_COMMUNITY)
Admission: EM | Admit: 2019-07-23 | Discharge: 2019-07-23 | Disposition: A | Discharge status: Home / Self Care | Payer: Self-pay | Attending: Emergency Medicine | Admitting: Emergency Medicine

## 2019-07-23 ENCOUNTER — Encounter (HOSPITAL_COMMUNITY): Payer: Self-pay

## 2019-07-23 DIAGNOSIS — F1010 Alcohol abuse, uncomplicated: Secondary | ICD-10-CM

## 2019-07-23 DIAGNOSIS — K7031 Alcoholic cirrhosis of liver with ascites: Secondary | ICD-10-CM

## 2019-07-23 DIAGNOSIS — I10 Essential (primary) hypertension: Secondary | ICD-10-CM | POA: Insufficient documentation

## 2019-07-23 DIAGNOSIS — R160 Hepatomegaly, not elsewhere classified: Secondary | ICD-10-CM

## 2019-07-23 DIAGNOSIS — F17210 Nicotine dependence, cigarettes, uncomplicated: Secondary | ICD-10-CM | POA: Insufficient documentation

## 2019-07-23 DIAGNOSIS — Z79899 Other long term (current) drug therapy: Secondary | ICD-10-CM | POA: Insufficient documentation

## 2019-07-23 LAB — COMPREHENSIVE METABOLIC PANEL
ALT: 48 U/L — ABNORMAL HIGH (ref 0–44)
AST: 292 U/L — ABNORMAL HIGH (ref 15–41)
Albumin: 3.2 g/dL — ABNORMAL LOW (ref 3.5–5.0)
Alkaline Phosphatase: 299 U/L — ABNORMAL HIGH (ref 38–126)
Anion gap: 15 (ref 5–15)
BUN: <5 mg/dL — ABNORMAL LOW (ref 6–20)
CO2: 22 mmol/L (ref 22–32)
Calcium: 7.9 mg/dL — ABNORMAL LOW (ref 8.9–10.3)
Chloride: 98 mmol/L (ref 98–111)
Creatinine, Ser: 0.3 mg/dL — ABNORMAL LOW (ref 0.44–1.00)
GFR calc Af Amer: >60 mL/min (ref >60)
GFR calc non Af Amer: >60 mL/min (ref >60)
Glucose, Bld: 127 mg/dL — ABNORMAL HIGH (ref 70–99)
Potassium: 3.5 mmol/L (ref 3.5–5.1)
Sodium: 135 mmol/L (ref 135–145)
Total Bilirubin: 4.5 mg/dL — ABNORMAL HIGH (ref 0.3–1.2)
Total Protein: 7.3 g/dL (ref 6.5–8.1)

## 2019-07-23 LAB — ETHANOL: Alcohol, Ethyl (B): 380 mg/dL (ref <10)

## 2019-07-23 LAB — CBC WITH DIFFERENTIAL/PLATELET
Abs Immature Granulocytes: 0.02 10*3/uL (ref 0.00–0.07)
Basophils Absolute: 0.1 10*3/uL (ref 0.0–0.1)
Basophils Relative: 2 %
Eosinophils Absolute: 0 10*3/uL (ref 0.0–0.5)
Eosinophils Relative: 0 %
HCT: 42.7 % (ref 36.0–46.0)
Hemoglobin: 15.1 g/dL — ABNORMAL HIGH (ref 12.0–15.0)
Immature Granulocytes: 0 %
Lymphocytes Relative: 28 %
Lymphs Abs: 1.5 10*3/uL (ref 0.7–4.0)
MCH: 37.8 pg — ABNORMAL HIGH (ref 26.0–34.0)
MCHC: 35.4 g/dL (ref 30.0–36.0)
MCV: 107 fL — ABNORMAL HIGH (ref 80.0–100.0)
Monocytes Absolute: 0.7 10*3/uL (ref 0.1–1.0)
Monocytes Relative: 13 %
Neutro Abs: 3.2 10*3/uL (ref 1.7–7.7)
Neutrophils Relative %: 57 %
Platelets: 96 10*3/uL — ABNORMAL LOW (ref 150–400)
RBC: 3.99 MIL/uL (ref 3.87–5.11)
RDW: 13.2 % (ref 11.5–15.5)
WBC: 5.5 10*3/uL (ref 4.0–10.5)
nRBC: 0 % (ref 0.0–0.2)

## 2019-07-23 LAB — LIPASE, BLOOD: Lipase: 39 U/L (ref 11–51)

## 2019-07-23 LAB — URINALYSIS, ROUTINE W REFLEX MICROSCOPIC
Bilirubin Urine: NEGATIVE
Glucose, UA: NEGATIVE mg/dL
Hgb urine dipstick: NEGATIVE
Ketones, ur: NEGATIVE mg/dL
Leukocytes,Ua: NEGATIVE
Nitrite: NEGATIVE
Protein, ur: NEGATIVE mg/dL
Specific Gravity, Urine: 1.002 — ABNORMAL LOW (ref 1.005–1.030)
pH: 7 (ref 5.0–8.0)

## 2019-07-23 LAB — PROTIME-INR
INR: 1.2 (ref 0.8–1.2)
Prothrombin Time: 14.9 seconds (ref 11.4–15.2)

## 2019-07-23 LAB — RAPID URINE DRUG SCREEN, HOSP PERFORMED
Amphetamines: NOT DETECTED
Barbiturates: NOT DETECTED
Benzodiazepines: NOT DETECTED
Cocaine: NOT DETECTED
Opiates: NOT DETECTED
Tetrahydrocannabinol: NOT DETECTED

## 2019-07-23 LAB — LACTIC ACID, PLASMA: Lactic Acid, Venous: 2.6 mmol/L (ref 0.5–1.9)

## 2019-07-23 LAB — BRAIN NATRIURETIC PEPTIDE: B Natriuretic Peptide: 61 pg/mL (ref 0.0–100.0)

## 2019-07-23 LAB — ACETAMINOPHEN LEVEL: Acetaminophen (Tylenol), Serum: <10 ug/mL — ABNORMAL LOW (ref 10–30)

## 2019-07-23 LAB — SALICYLATE LEVEL: Salicylate Lvl: <7 mg/dL — ABNORMAL LOW (ref 7.0–30.0)

## 2019-07-23 IMAGING — CT CT ABD-PELV W/ CM
2 of 5 series · 15 of 46 positions shown, 17 images · IV contrast (Omnipaque or Isovue)
Comparison: [DATE]

CLINICAL DATA: Generalized abdominal pain for 3 weeks. Nausea,
vomiting, diarrhea. History of alcohol abuse

EXAM:
CT ABDOMEN AND PELVIS WITH CONTRAST
TECHNIQUE: Multidetector CT imaging of the abdomen and pelvis was performed
using the standard protocol following bolus administration of
intravenous contrast.
CONTRAST:  100mL OMNIPAQUE IOHEXOL 300 MG/ML  SOLN

[Series 2: axial st · axial · 0.78mm/px · z∈[-747,-322]mm · 12 of 97 slices shown, 14 images]
[im 6/97  soft-tissue]
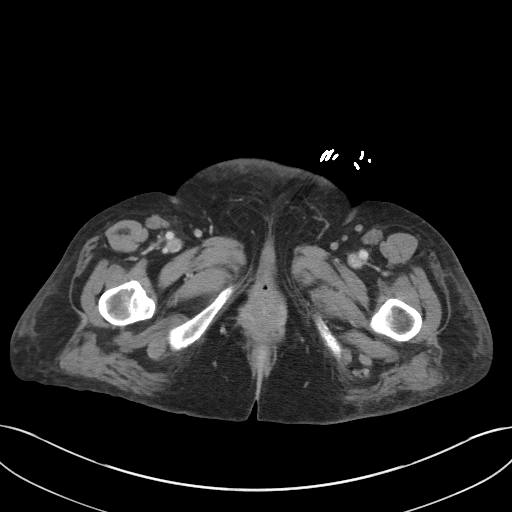
[im 6/97  bone]
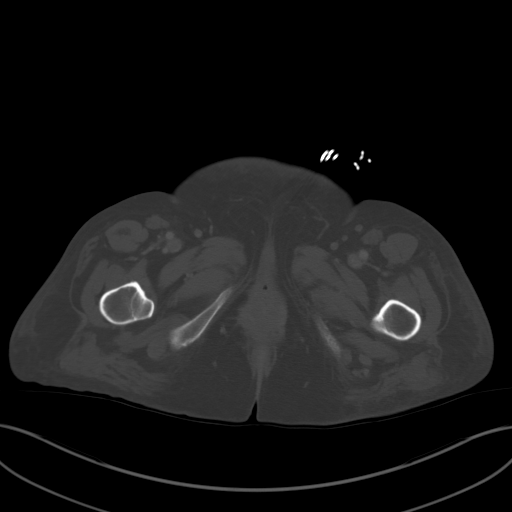
[im 17/97  soft-tissue]
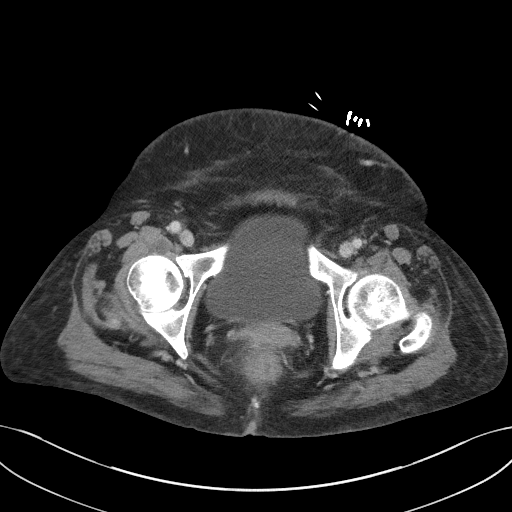
[im 22/97  soft-tissue]
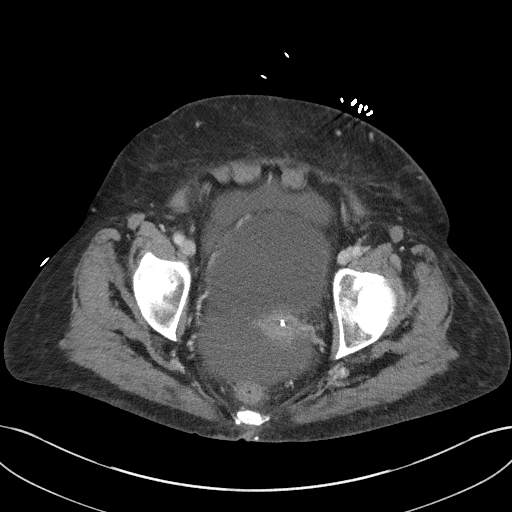
[im 27/97  soft-tissue]
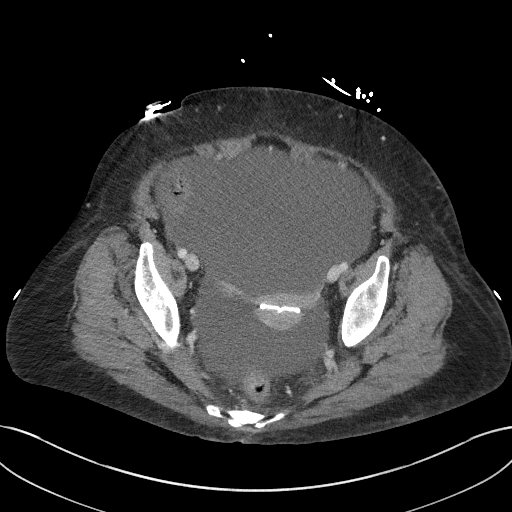
[im 38/97  soft-tissue]
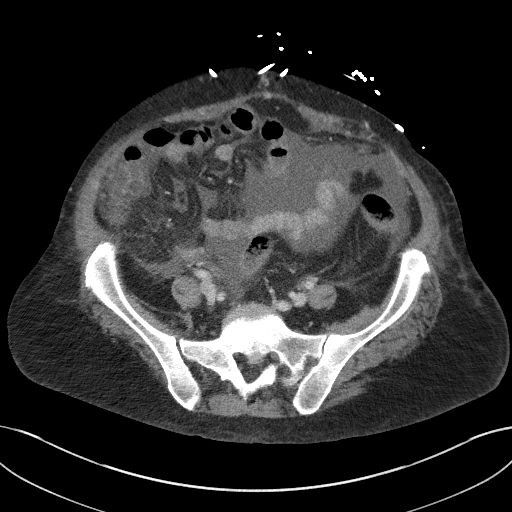
[im 43/97  soft-tissue]
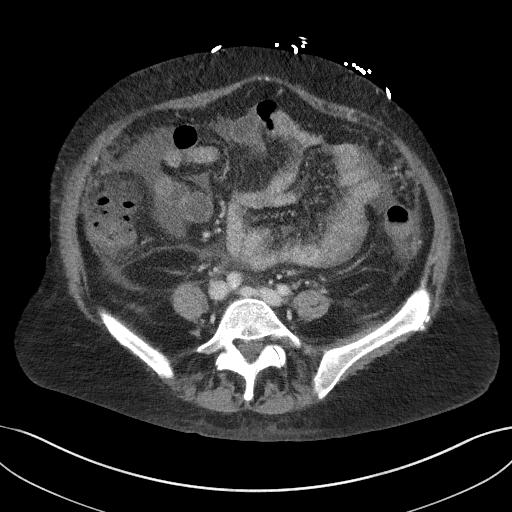
[im 54/97  soft-tissue]
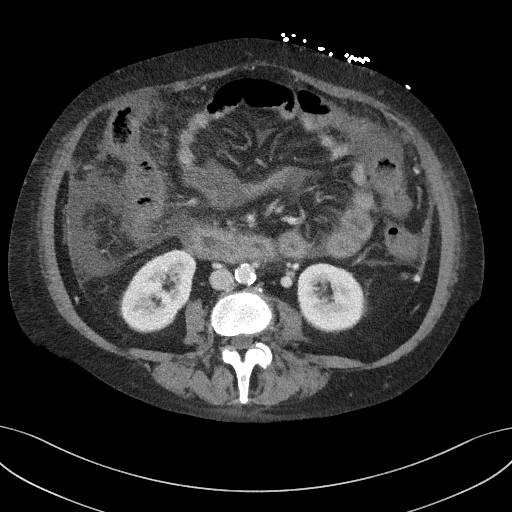
[im 59/97  soft-tissue]
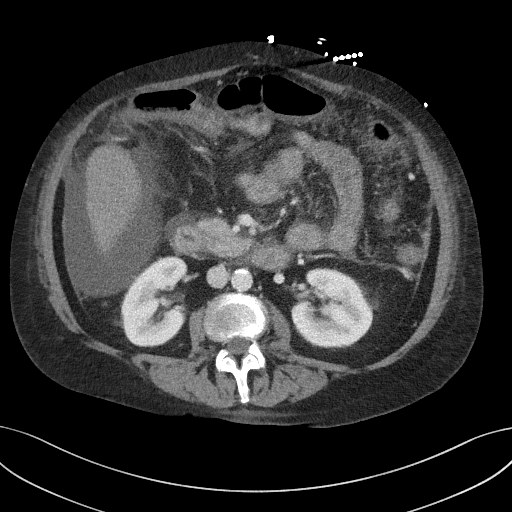
[im 70/97  soft-tissue]
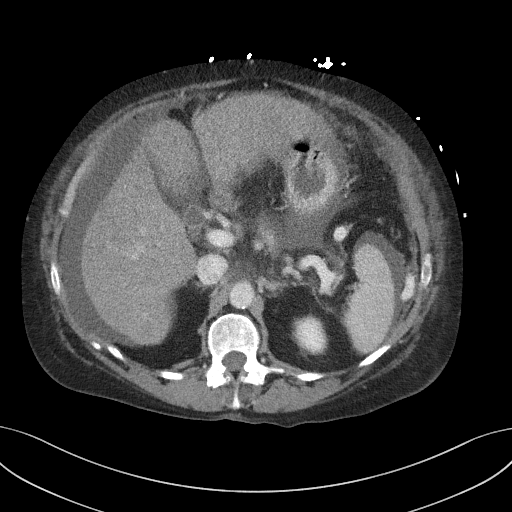
[im 70/97  bone]
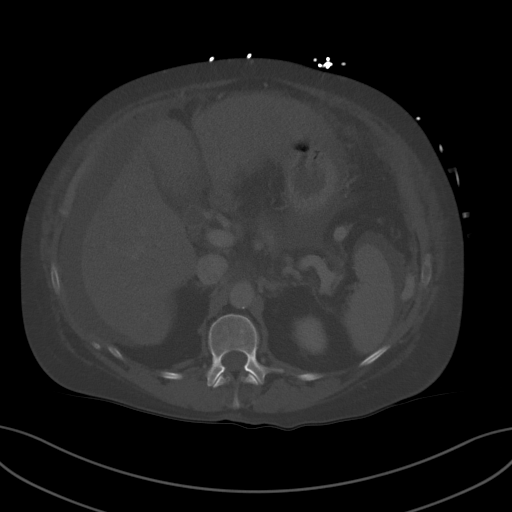
[im 75/97  soft-tissue]
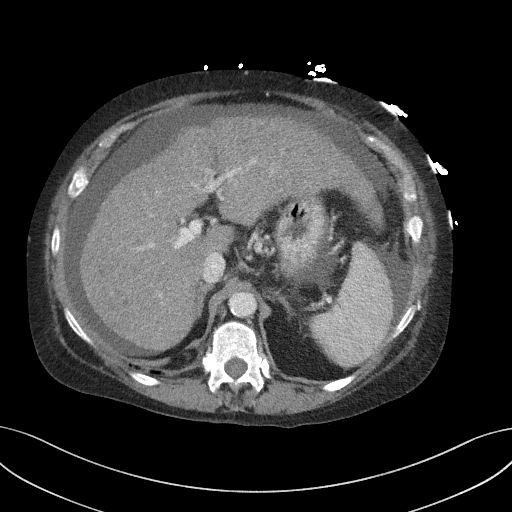
[im 81/97  soft-tissue]
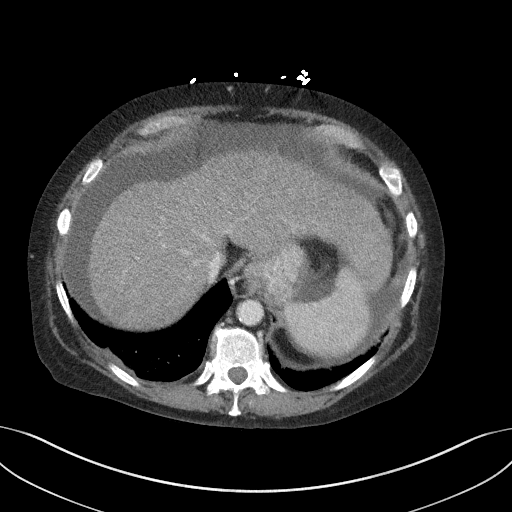
[im 91/97  soft-tissue]
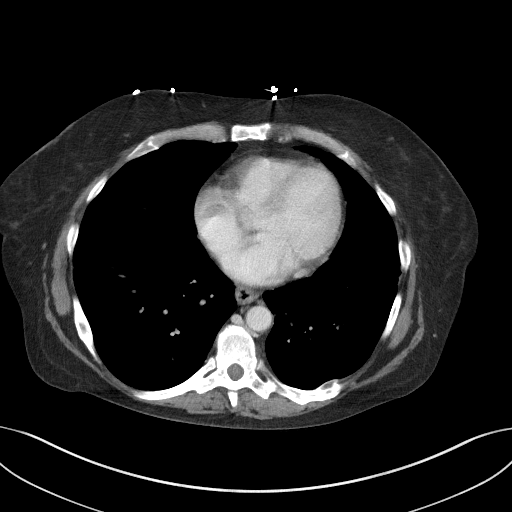

[Series 5: coronal st · coronal · 0.80mm/px · 3 of 111 slices shown]
[im 37/111  soft-tissue]
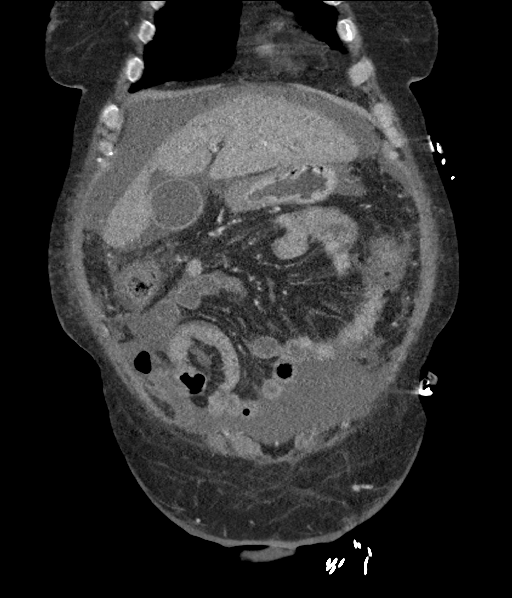
[im 49/111  soft-tissue]
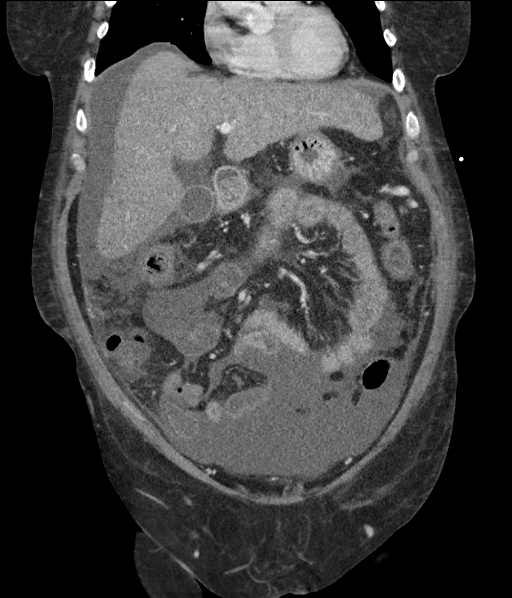
[im 62/111  soft-tissue]
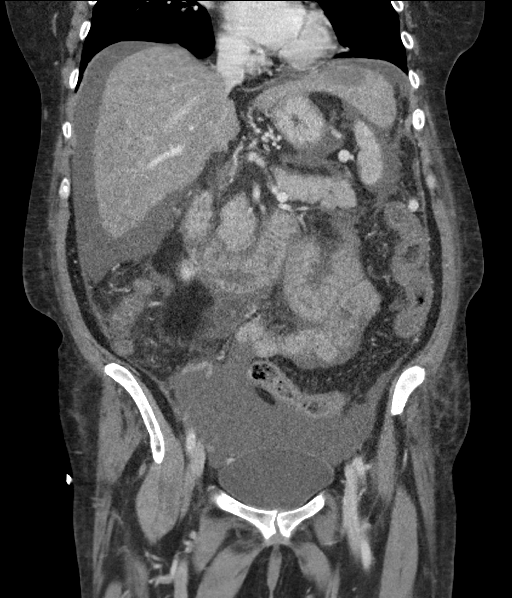

[15 of 46 positions shown; findings below may reference images not displayed]

FINDINGS: Lower chest: Small dependent right basilar opacity, favor
atelectasis. Normal heart size.

Hepatobiliary: Subtly nodular hepatic surface contour. Hepatic
parenchyma is diffusely heterogeneous with decreased attenuation.
Probable hepatic hemangioma near the left hepatic dome (series 5,
image 64), which was present on prior CT [DATE]. There are a few
subtle areas of more focal decreased attenuation for instance within
the left hepatic lobe (series 2, image 22) and posterior right
hepatic lobe (series 2, image 21), which are indeterminate.
Gallbladder is moderately distended. No hyperdense gallstone. No
biliary dilatation.

Pancreas: Gallbladder enhances normally. No pancreatic ductal
dilatation. No definite peripancreatic inflammatory changes.

Spleen: Normal in size without focal abnormality.

Adrenals/Urinary Tract: Unremarkable adrenal glands. Kidneys enhance
symmetrically without focal lesion, stone, or hydronephrosis.
Ureters are nondilated. Urinary bladder appears unremarkable.

Stomach/Bowel: Stomach is within normal limits. Appendix appears
normal (series 5, image 62). There are a few scattered colonic
diverticula. No evidence of bowel wall thickening, distention, or
inflammatory changes.

Vascular/Lymphatic: Numerous upper abdominal varices including
perisplenic and paraesophageal. Aortic atherosclerosis without
aneurysm. Portal vein appears patent. No abdominopelvic
lymphadenopathy.

Reproductive: IUD present within the uterus.  No adnexal masses.

Other: Moderate volume ascites throughout the abdomen and pelvis. No
free intraperitoneal air. No abdominal wall hernia.

Musculoskeletal: Multiple remote bilateral rib fractures. Sclerotic
appearance of the superior aspects of the bilateral femoral heads
suggesting changes of early avascular necrosis. Femoral head
contours are maintained without evidence of cortical collapse. No
acute osseous findings.
IMPRESSION: 1. Moderate volume ascites throughout the abdomen and pelvis.
2. Findings of cirrhosis and portal hypertension.
3. Indeterminate areas of low attenuation within the liver, possibly
small liver masses. Further evaluation with nonemergent contrast
enhanced MRI of the abdomen is recommended.
4. Small dependent right basilar opacity, favor atelectasis.
5. Sclerotic appearance of the superior aspects of the bilateral
femoral heads suggesting changes of early avascular necrosis.
Femoral head contours are maintained without evidence of cortical
collapse.
6. Aortic atherosclerosis. ([Z0]-[Z0]).

## 2019-07-23 IMAGING — DX DG CHEST 1V PORT
1 series · 1 of 1 positions shown · non-contrast
Comparison: [DATE]

CLINICAL DATA: Cough and shortness of breath. Abdominal pain and
swelling.

EXAM:
PORTABLE CHEST 1 VIEW

[chest ap]
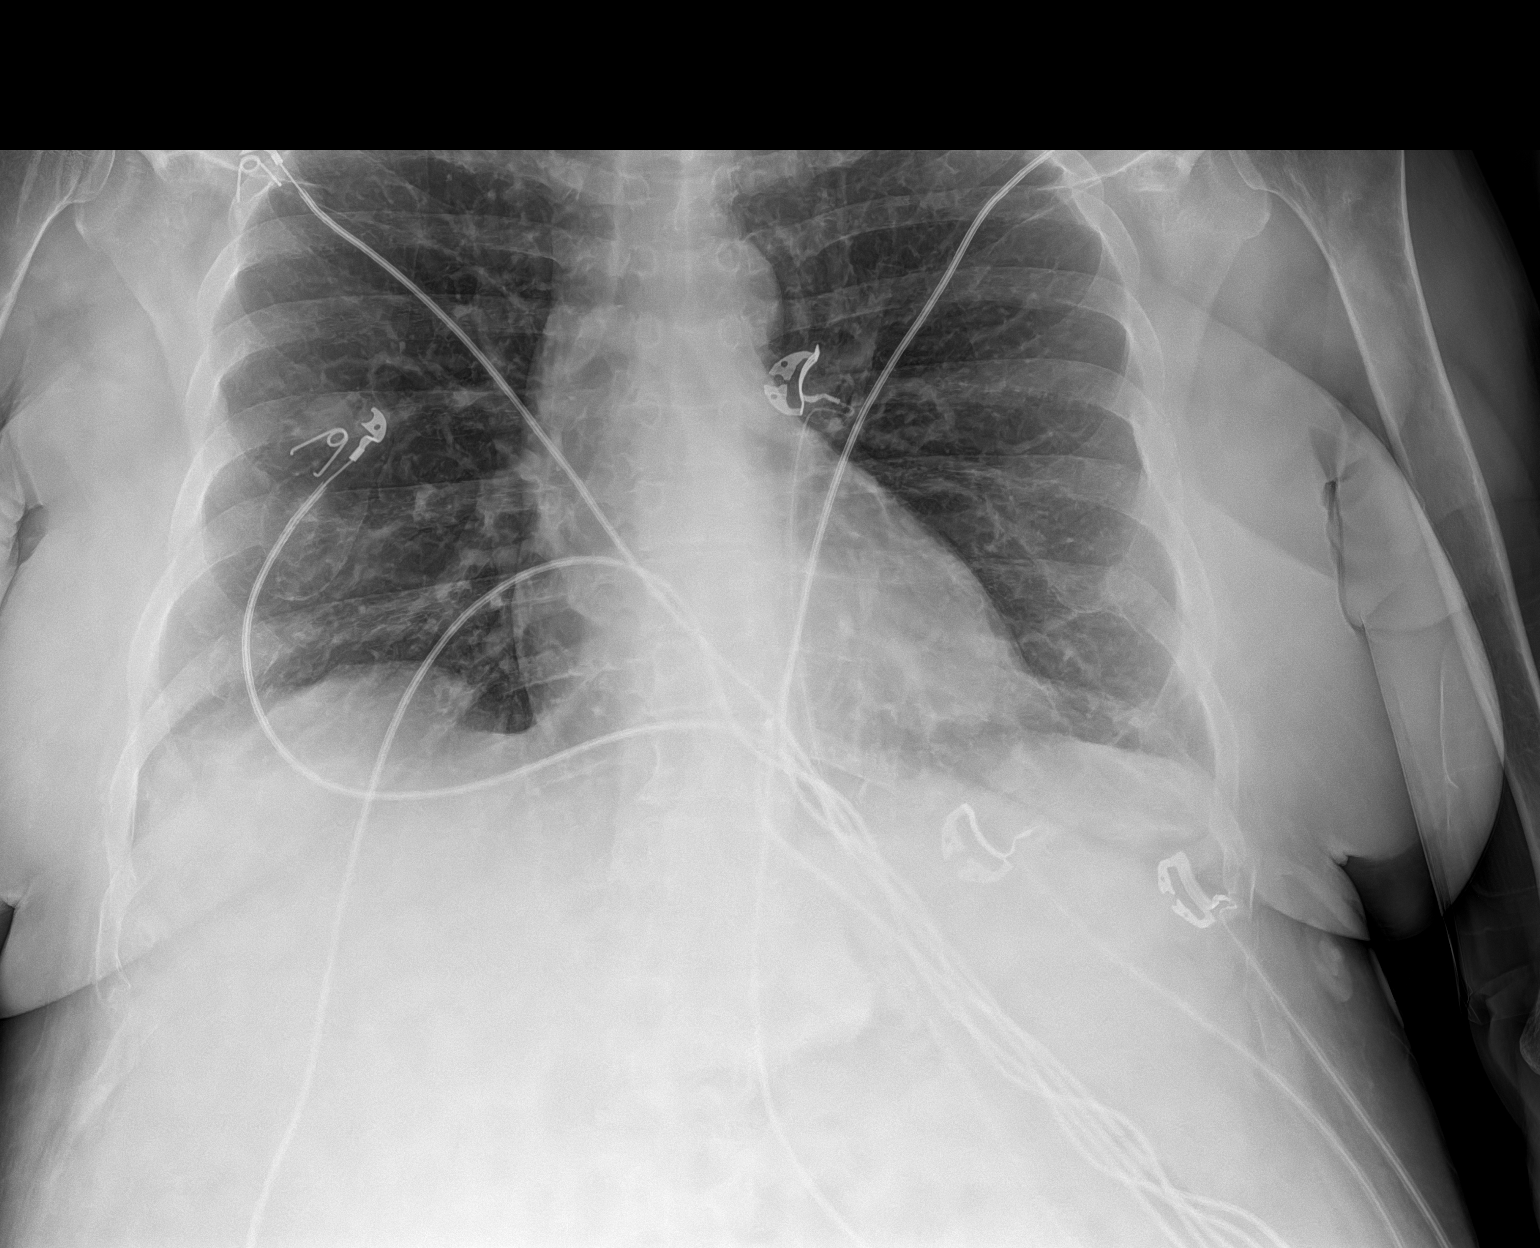

[1 of 1 positions shown; findings below may reference images not displayed]

FINDINGS: Artifact overlies the chest. Heart size within normal limits.
Mediastinal shadows are normal. Chronic interstitial lung markings
appear similar to previous studies allowing for technical factors
and poor inspiration. No sign of consolidation, collapse or
effusion. Old healed rib fractures on both sides.
IMPRESSION: Chronic interstitial lung markings, unchanged allowing for the poor
inspiration.

## 2019-07-23 MED ORDER — IOHEXOL 300 MG/ML  SOLN
100.0000 mL | Freq: Once | INTRAMUSCULAR | Status: AC | PRN
  Administered 2019-07-23: 100 mL via INTRAVENOUS

## 2019-07-23 MED ORDER — NAPROXEN 250 MG PO TABS
500.0000 mg | ORAL_TABLET | Freq: Once | ORAL | Status: AC
  Administered 2019-07-23: 500 mg via ORAL
  Filled 2019-07-23: qty 2

## 2019-07-23 MED ORDER — SODIUM CHLORIDE 0.9 % IV BOLUS
1000.0000 mL | Freq: Once | INTRAVENOUS | Status: AC
  Administered 2019-07-23: 10:00:00 1000 mL via INTRAVENOUS

## 2019-07-23 MED ORDER — FUROSEMIDE 20 MG PO TABS
20.0000 mg | ORAL_TABLET | Freq: Every day | ORAL | 0 refills | Status: DC
Start: 2019-07-23 — End: 2019-08-23

## 2019-07-23 NOTE — ED Provider Notes (Signed)
[facility] EMERGENCY DEPARTMENT Provider Note   CSN: [id number] Arrival date & time: 07/23/19  0737     History Chief Complaint  Patient presents with  . Abdominal Pain    Kayla Sandoval is a 49 y.o. female.  HPI   This patient is a 49 year old female, she has a known history of anxiety depression hypertension and a long time history of alcohol abuse.  According to the medical record the patient has had multiple other problems over time including encephalopathy, acute respiratory failure with hypoxia, she has had major depressive disorder with psychosis, she has had a history of a hemothorax after trauma and multiple rib fractures.  She presents to the hospital today with a complaint of swelling and weight gain.  She complains primarily of abdominal swelling with about 15 pounds of weight gain over the last month.  She has had increased frequency of nausea and vomiting, states that she is coughing and often has posttussive emesis which is occasionally sprinkled with blood.  She has not had any blood in her stools nor has she had black tarry stools.  She has fatigue and decreased energy, decreased appetite but is still drinking in fact she is drinking alcohol daily, she has already had 2 beers this morning, she reports that a normal day is about 3 or 4 beers throughout the day and beer to help her get to sleep at night.  She has never been formally diagnosed with liver disease, she states "my liver, my kidney, my pancreas and my ovaries are all hurting".  There has been no fevers or chills, no headache, no blurred vision.  Past Medical History:  Diagnosis Date  . Anxiety   . Depression   . Hypertension     Patient Active Problem List   Diagnosis Date Noted  . Alcohol use disorder, moderate, dependence ([place]) 02/15/2016  . Substance-induced psychotic disorder with hallucinations ([place]) 02/15/2016  . History of head injury 02/15/2016  . History of spinal fracture 02/15/2016  . Feeling  grief   . MDD (major depressive disorder), recurrent severe, without psychosis ([place]) 04/08/2015  . Acute stress disorder 04/08/2015  . Insomnia 04/08/2015  . Recent bereavement 04/07/2015  . Alcohol withdrawal ([place]) 04/06/2015  . CAP (community acquired pneumonia) 04/03/2015  . Overdose 04/03/2015  . Acute encephalopathy 04/03/2015  . Acute respiratory failure with hypoxia ([place]) 04/03/2015  . Alcohol intoxication ([place]) 04/03/2015  . Tobacco use disorder 04/03/2015  . Hemothorax on left 04/09/2011  . Pleural effusion 03/27/2011  . Pneumonia 03/27/2011  . Tobacco abuse 03/27/2011  . Hypertension 03/27/2011  . Macrocytosis without anemia 03/27/2011  . Depression 03/27/2011    Past Surgical History:  Procedure Laterality Date  . chest thoracostomy       OB History   No obstetric history on file.     Family History  Problem Relation Age of Onset  . Depression Mother   . Hypertension Mother     Social History   Tobacco Use  . Smoking status: Current Every Day Smoker    Packs/day: 0.50    Years: 10.00    Pack years: 5.00    Types: Cigarettes  . Smokeless tobacco: Never Used  Vaping Use  . Vaping Use: Never used  Substance Use Topics  . Alcohol use: Yes    Alcohol/week: 3.0 standard drinks    Types: 3 Cans of beer per week    Comment: nightly  . Drug use: Not Currently    Types: Marijuana  Home Medications Prior to Admission medications   Medication Sig Start Date End Date Taking? Authorizing Provider  azithromycin (ZITHROMAX Z-PAK) 250 MG tablet 2 po day one, then 1 daily x 4 days 03/22/18   Veryl Speak, MD  citalopram (CELEXA) 10 MG tablet Take 1 tablet (10 mg total) by mouth daily. 02/17/16   Kerrie Buffalo, NP  cyclobenzaprine (FLEXERIL) 10 MG tablet Take 1 tablet (10 mg total) by mouth 2 (two) times daily as needed for muscle spasms. 02/21/18   Maudie Flakes, MD  furosemide (LASIX) 20 MG tablet Take 1 tablet (20 mg total) by mouth daily for 7 days.  07/23/19 07/30/19  Noemi Chapel, MD  hydrocortisone 2.5 % lotion Apply topically 2 (two) times daily. 10/07/17   Frederica Kuster, PA-C  hydrOXYzine (ATARAX/VISTARIL) 25 MG tablet Take 1 tablet (25 mg total) by mouth every 6 (six) hours as needed (anxiety/agitation or CIWA < or = 10). 02/16/16   Kerrie Buffalo, NP  ibuprofen (ADVIL) 800 MG tablet Take 1 tablet (800 mg total) by mouth every 6 (six) hours as needed for moderate pain. 12/25/18   Orpah Greek, MD  lidocaine (LIDODERM) 5 % Place 1 patch onto the skin daily. Remove & Discard patch within 12 hours or as directed by MD 02/21/18   Maudie Flakes, MD  lisinopril (PRINIVIL,ZESTRIL) 10 MG tablet Take 1 tablet (10 mg total) by mouth daily. 10/07/17   Law, Bea Graff, PA-C  naproxen (NAPROSYN) 500 MG tablet Take 1 tablet (500 mg total) by mouth 2 (two) times daily. 02/21/18   Maudie Flakes, MD  nicotine (NICODERM CQ - DOSED IN MG/24 HOURS) 14 mg/24hr patch Place 1 patch (14 mg total) onto the skin daily. 02/17/16   Kerrie Buffalo, NP  pseudoephedrine (SUDAFED 12 HOUR) 120 MG 12 hr tablet Take 1 tablet (120 mg total) by mouth 2 (two) times daily. 02/21/18   Maudie Flakes, MD  QUEtiapine (SEROQUEL) 25 MG tablet Take 1 tablet (25 mg total) by mouth at bedtime. 02/16/16   Kerrie Buffalo, NP  Saccharomyces boulardii (PROBIOTIC) 250 MG CAPS Take 1 capsule by mouth daily. 10/07/17   Law, Bea Graff, PA-C  traMADol (ULTRAM) 50 MG tablet Take 1 tablet (50 mg total) by mouth every 6 (six) hours as needed. 12/25/18   Orpah Greek, MD  traZODone (DESYREL) 50 MG tablet Take 1 tablet (50 mg total) by mouth at bedtime as needed for sleep. 02/16/16   Kerrie Buffalo, NP    Allergies    Patient has no known allergies.  Review of Systems   Review of Systems  All other systems reviewed and are negative.   Physical Exam Updated Vital Signs BP 114/81 (BP Location: Left Arm)   Pulse (!) 106   Temp 98 F (36.7 C) (Oral)   Resp 17   Ht 1.626  m (5\' 4" )   Wt 68 kg   SpO2 93%   BMI 25.75 kg/m   Physical Exam Vitals and nursing note reviewed.  Constitutional:      General: She is not in acute distress.    Appearance: She is well-developed.     Comments: Chronically ill-appearing  HENT:     Head: Normocephalic and atraumatic.     Mouth/Throat:     Pharynx: No oropharyngeal exudate.  Eyes:     General:        Right eye: No discharge.        Left eye: No discharge.  Conjunctiva/sclera: Conjunctivae normal.     Pupils: Pupils are equal, round, and reactive to light.     Comments: Possible subtle jaundice of the eyes  Neck:     Thyroid: No thyromegaly.     Vascular: No JVD.  Cardiovascular:     Rate and Rhythm: Regular rhythm. Tachycardia present.     Heart sounds: Normal heart sounds. No murmur heard.  No friction rub. No gallop.      Comments: Tachycardic to approximately 105 to 120 bpm depending on whether she is laying down or sitting up Pulmonary:     Effort: Pulmonary effort is normal. No respiratory distress.     Breath sounds: Normal breath sounds. No wheezing or rales.  Abdominal:     General: Bowel sounds are normal. There is distension.     Palpations: Abdomen is soft. There is no mass.     Tenderness: There is abdominal tenderness.     Comments: Mild diffuse tenderness, the patient is rotund with a protuberant distended abdomen, minimal tenderness, no guarding or peritoneal signs, unable to feel liver or spleen edge.  Musculoskeletal:        General: No tenderness. Normal range of motion.     Cervical back: Normal range of motion and neck supple.     Right lower leg: Edema present.     Left lower leg: Edema present.     Comments: Less than 1+ bilateral symmetrical pitting edema below the knees  Lymphadenopathy:     Cervical: No cervical adenopathy.  Skin:    General: Skin is warm and dry.     Findings: No erythema or rash.  Neurological:     Mental Status: She is alert.     Coordination:  Coordination normal.     Comments: The patient is able to follow commands, she appears generally weak but able to do everything that I ask her to do.  She has normal 5 out of 5 strength in all 4 extremities.  Cranial nerves III through XII are normal, speech is normal.  Psychiatric:        Behavior: Behavior normal.     ED Results / Procedures / Treatments   Labs (all labs ordered are listed, but only abnormal results are displayed) Labs Reviewed  CBC WITH DIFFERENTIAL/PLATELET - Abnormal; Notable for the following components:      Result Value   Hemoglobin 15.1 (*)    MCV 107.0 (*)    MCH 37.8 (*)    Platelets 96 (*)    All other components within normal limits  COMPREHENSIVE METABOLIC PANEL - Abnormal; Notable for the following components:   Glucose, Bld 127 (*)    BUN <5 (*)    Creatinine, Ser 0.30 (*)    Calcium 7.9 (*)    Albumin 3.2 (*)    AST 292 (*)    ALT 48 (*)    Alkaline Phosphatase 299 (*)    Total Bilirubin 4.5 (*)    All other components within normal limits  LACTIC ACID, PLASMA - Abnormal; Notable for the following components:   Lactic Acid, Venous 2.6 (*)    All other components within normal limits  URINALYSIS, ROUTINE W REFLEX MICROSCOPIC - Abnormal; Notable for the following components:   Specific Gravity, Urine 1.002 (*)    All other components within normal limits  SALICYLATE LEVEL - Abnormal; Notable for the following components:   Salicylate Lvl <6.2 (*)    All other components within normal limits  ACETAMINOPHEN LEVEL -  Abnormal; Notable for the following components:   Acetaminophen (Tylenol), Serum <10 (*)    All other components within normal limits  ETHANOL - Abnormal; Notable for the following components:   Alcohol, Ethyl (B) 380 (*)    All other components within normal limits  LIPASE, BLOOD  RAPID URINE DRUG SCREEN, HOSP PERFORMED  BRAIN NATRIURETIC PEPTIDE  PROTIME-INR    EKG EKG Interpretation  Date/Time:  Friday July 23 2019  10:05:24 EDT Ventricular Rate:  88 PR Interval:    QRS Duration: 102 QT Interval:  397 QTC Calculation: 481 R Axis:   43 Text Interpretation: Sinus rhythm Low voltage, precordial leads Since last tracing voltage lower. Confirmed by Noemi Chapel [phone]) on 07/23/2019 10:09:35 AM   Radiology CT ABDOMEN PELVIS W CONTRAST  Result Date: 07/23/2019 CLINICAL DATA:  Generalized abdominal pain for 3 weeks. Nausea, vomiting, diarrhea. History of alcohol abuse EXAM: CT ABDOMEN AND PELVIS WITH CONTRAST TECHNIQUE: Multidetector CT imaging of the abdomen and pelvis was performed using the standard protocol following bolus administration of intravenous contrast. CONTRAST:  110mL OMNIPAQUE IOHEXOL 300 MG/ML  SOLN COMPARISON:  11/03/2015 FINDINGS: Lower chest: Small dependent right basilar opacity, favor atelectasis. Normal heart size. Hepatobiliary: Subtly nodular hepatic surface contour. Hepatic parenchyma is diffusely heterogeneous with decreased attenuation. Probable hepatic hemangioma near the left hepatic dome (series 5, image 64), which was present on prior CT 11/03/2015. There are a few subtle areas of more focal decreased attenuation for instance within the left hepatic lobe (series 2, image 22) and posterior right hepatic lobe (series 2, image 21), which are indeterminate. Gallbladder is moderately distended. No hyperdense gallstone. No biliary dilatation. Pancreas: Gallbladder enhances normally. No pancreatic ductal dilatation. No definite peripancreatic inflammatory changes. Spleen: Normal in size without focal abnormality. Adrenals/Urinary Tract: Unremarkable adrenal glands. Kidneys enhance symmetrically without focal lesion, stone, or hydronephrosis. Ureters are nondilated. Urinary bladder appears unremarkable. Stomach/Bowel: Stomach is within normal limits. Appendix appears normal (series 5, image 62). There are a few scattered colonic diverticula. No evidence of bowel wall thickening, distention, or  inflammatory changes. Vascular/Lymphatic: Numerous upper abdominal varices including perisplenic and paraesophageal. Aortic atherosclerosis without aneurysm. Portal vein appears patent. No abdominopelvic lymphadenopathy. Reproductive: IUD present within the uterus.  No adnexal masses. Other: Moderate volume ascites throughout the abdomen and pelvis. No free intraperitoneal air. No abdominal wall hernia. Musculoskeletal: Multiple remote bilateral rib fractures. Sclerotic appearance of the superior aspects of the bilateral femoral heads suggesting changes of early avascular necrosis. Femoral head contours are maintained without evidence of cortical collapse. No acute osseous findings. IMPRESSION: 1. Moderate volume ascites throughout the abdomen and pelvis. 2. Findings of cirrhosis and portal hypertension. 3. Indeterminate areas of low attenuation within the liver, possibly small liver masses. Further evaluation with nonemergent contrast enhanced MRI of the abdomen is recommended. 4. Small dependent right basilar opacity, favor atelectasis. 5. Sclerotic appearance of the superior aspects of the bilateral femoral heads suggesting changes of early avascular necrosis. Femoral head contours are maintained without evidence of cortical collapse. 6. Aortic atherosclerosis. (ICD10-I70.0). Electronically Signed   By: Davina Poke D.O.   On: 07/23/2019 13:01   DG Chest Port 1 View  Result Date: 07/23/2019 CLINICAL DATA:  Cough and shortness of breath. Abdominal pain and swelling. EXAM: PORTABLE CHEST 1 VIEW COMPARISON:  12/25/2018 FINDINGS: Artifact overlies the chest. Heart size within normal limits. Mediastinal shadows are normal. Chronic interstitial lung markings appear similar to previous studies allowing for technical factors and poor inspiration. No sign of consolidation,  collapse or effusion. Old healed rib fractures on both sides. IMPRESSION: Chronic interstitial lung markings, unchanged allowing for the poor  inspiration. Electronically Signed   By: Nelson Chimes M.D.   On: 07/23/2019 10:22    Procedures Ultrasound ED FAST  Date/Time: 07/23/2019 9:54 AM Performed by: Noemi Chapel, MD Authorized by: Noemi Chapel, MD  Procedure details:    Indications comment:  Abdominal distention    Assess for:  Intra-abdominal fluid    Technique:  Abdominal    Images: archived    Study Limitations: body habitus and bowel gas  Abdominal findings:    L kidney:  Visualized   R kidney:  Visualized   Liver:  Visualized   Bladder:  Visualized,    Hepatorenal space visualized: identified     Splenorenal space: identified     Rectovesical free fluid: not identified     Splenorenal free fluid: not identified     Hepatorenal space free fluid: identified   Comments:     This FAST exam was positive for fluid in the left lower quadrant and the Morison's pouch on the right upper quadrant.  There was no signs of pericardial effusion and no free fluid was witnessed in the left upper quadrant.     (including critical care time)  Medications Ordered in ED Medications  naproxen (NAPROSYN) tablet 500 mg (has no administration in time range)  sodium chloride 0.9 % bolus 1,000 mL (0 mLs Intravenous Stopping Infusion hung by another clincian 07/23/19 1100)  iohexol (OMNIPAQUE) 300 MG/ML solution 100 mL (100 mLs Intravenous Contrast Given 07/23/19 1203)    ED Course  I have reviewed the triage vital signs and the nursing notes.  Pertinent labs & imaging results that were available during my care of the patient were reviewed by me and considered in my medical decision making (see chart for details).    MDM Rules/Calculators/A&P                          I suspect the patient has some degree of liver failure given her protuberant abdomen, this may be ascites, she has history of significant alcohol use but also endorses taking daily Tylenol ibuprofen and aspirin.  This may also be contributing to her symptoms.  She is  tachycardic and seems to be orthostatic as when she sits up her heart rate jumps by 15 to 20 bpm.  She is reporting frequent vomiting, very little appropriate caloric intake.  Will check labs, CMP, CBC, lipase, lactic acid, urinalysis, liver function testing.  She will need to have a bedside ultrasound to evaluate for the volume of ascites.  We will check a chest x-ray as the patient has been coughing though I do not hear any abnormal lung sounds specifically.  She is afebrile, her blood pressure slightly elevated.  Patient is agreeable to the plan.  Urinalysis negative Urine drug screen negative White blood cell count 5500 with slightly low platelets which is expected with her history of alcoholism.  MCV is also elevated consistent with alcohol abuse Comprehensive metabolic panel without significant abnormalities except for liver function, this is expectedly elevated with an AST of 292 and an ALT of 48 with an alkaline phos of 299 and a bilirubin of 4.5. Lipase is normal Lactic acid was 2.6, also likely due to liver failure as the patient really does not have that much abdominal pain.  The CT scan did not show any surgical causes of pain.  The patient CT scan does show that she has likely liver cirrhosis with ascites as well as possible liver masses.  She will need to have this worked up as an outpatient was given follow-up with local family doctor as well as with gastroenterology, she will be given furosemide for 7 days to help with swelling and fluid, encouraged to drink less alcohol and to ultimately stop drinking but not cold [place], she expressed her understanding.  She has medical decision-making capacity at this time with normal vital signs, no more tachycardia, stable for discharge.  Her heart rate on my exam is 90 prior to discharge   Final Clinical Impression(s) / ED Diagnoses Final diagnoses:  Alcoholic cirrhosis of liver with ascites ([place])  Liver mass  Alcohol abuse    Rx / DC  Orders ED Discharge Orders         Ordered    furosemide (LASIX) 20 MG tablet  Daily     Discontinue  Reprint     07/23/19 1348           Noemi Chapel, MD 07/23/19 1352

## 2019-07-23 NOTE — ED Notes (Signed)
Date and time results received: 07/23/19 1017   Test: Lactic  Critical Value: 2.6  Name of Provider Notified: Sabra Heck  Orders Received? Or Actions Taken?: n/a

## 2019-07-23 NOTE — Discharge Instructions (Signed)
Please take the medication called furosemide please take furosemide 1 tablet daily for the next 7 days, this will help to get off some of the water and the fluid from your abdomen in your legs.  Your CT scan shows that you have cirrhosis which means that you have some liver failure, this is probably because of drinking.  You will also need to follow-up with the gastroenterologist or the GI specialist.  I have given you his phone number above.  You do have some spots on your liver that need to be checked for cancer, this can be done through a family doctor and I have given you a list of local family doctors below.  You should be seen this week, please call the phone numbers until you get a follow-up for within 1 week.  If you have severe or worsening pain or difficulty breathing come back to the hospital immediately for an emergency department evaluation.  You must reduce the amount of alcohol that you drink.  If you do not you will continue to have liver problems which will get worse and worse  [place] [facility] Doctor List    Sinda Du MD. Specialty: Pulmonary Disease Contact information: [place]  [place] [address]  [phone]   Tula Nakayama, MD. Specialty: [facility] Medicine Contact information: [address] [place] [address]  [phone]   Sallee Lange, MD. Specialty: [facility] Medicine Contact information: [address]  [place] [address]  [phone]   Rosita Fire, MD Specialty: Internal Medicine Contact information: [place] [address]  [phone]   Delphina Cahill, MD. Specialty: Internal Medicine Contact information: [place] [address]  [phone]    [facility] (Dr. Maudie Mercury) Specialty: Family Medicine Contact information: [place] [address]  [phone]   Leslie Andrea, MD. Specialty: [facility] Contact information: [place] [address]  [phone]   Asencion Noble, MD. Specialty: Internal Medicine Contact information: [place] [address]  [place] [address]  [place]  [address] [place], [place] [address] [phone]  Services [facility] offers a variety of basic health services.  Services include but are not limited to: Blood pressure checks  Heart rate checks  Blood sugar checks  Urine analysis  Rapid strep tests  Pregnancy tests.  Health education and referrals  People needing more complex services will be directed to a physician online. Using these virtual visits, doctors can evaluate and prescribe medicine and treatments. There will be no medication on-site, though [facility] will help patients fill their prescriptions at little to no cost.   For More information please go to: [email or web address]

## 2019-07-23 NOTE — ED Notes (Signed)
Date and time results received: 07/23/19 1020 (use smartphrase ".now" to insert current time)  Test: ETOH Critical Value: 380  Name of Provider Notified: Sabra Heck  Orders Received? Or Actions Taken?: n/a

## 2019-07-23 NOTE — ED Triage Notes (Signed)
Pt reports abd pain, swelling, n/v x 3 weeks.  Reports had diarrhea today but took a laxative yesterday.

## 2019-07-23 NOTE — ED Notes (Signed)
To CT

## 2019-07-23 NOTE — ED Notes (Signed)
Pt is aware a urine sample is needed, call light at bedside.

## 2019-08-16 ENCOUNTER — Encounter (HOSPITAL_COMMUNITY): Payer: Self-pay

## 2019-08-16 ENCOUNTER — Emergency Department (HOSPITAL_COMMUNITY): Payer: Self-pay

## 2019-08-16 ENCOUNTER — Inpatient Hospital Stay (HOSPITAL_COMMUNITY)
Admission: EM | Admit: 2019-08-16 | Discharge: 2019-08-23 | DRG: 432 | Disposition: A | Discharge status: Home / Self Care | Payer: Self-pay | Attending: Internal Medicine | Admitting: Internal Medicine

## 2019-08-16 DIAGNOSIS — D7589 Other specified diseases of blood and blood-forming organs: Secondary | ICD-10-CM | POA: Diagnosis present

## 2019-08-16 DIAGNOSIS — M549 Dorsalgia, unspecified: Secondary | ICD-10-CM | POA: Diagnosis not present

## 2019-08-16 DIAGNOSIS — T502X5A Adverse effect of carbonic-anhydrase inhibitors, benzothiadiazides and other diuretics, initial encounter: Secondary | ICD-10-CM | POA: Diagnosis not present

## 2019-08-16 DIAGNOSIS — E8770 Fluid overload, unspecified: Secondary | ICD-10-CM | POA: Diagnosis present

## 2019-08-16 DIAGNOSIS — E876 Hypokalemia: Secondary | ICD-10-CM | POA: Diagnosis not present

## 2019-08-16 DIAGNOSIS — D6959 Other secondary thrombocytopenia: Secondary | ICD-10-CM | POA: Diagnosis present

## 2019-08-16 DIAGNOSIS — K7290 Hepatic failure, unspecified without coma: Secondary | ICD-10-CM

## 2019-08-16 DIAGNOSIS — Z818 Family history of other mental and behavioral disorders: Secondary | ICD-10-CM

## 2019-08-16 DIAGNOSIS — I10 Essential (primary) hypertension: Secondary | ICD-10-CM | POA: Diagnosis present

## 2019-08-16 DIAGNOSIS — F419 Anxiety disorder, unspecified: Secondary | ICD-10-CM | POA: Diagnosis present

## 2019-08-16 DIAGNOSIS — Z79890 Hormone replacement therapy (postmenopausal): Secondary | ICD-10-CM

## 2019-08-16 DIAGNOSIS — R188 Other ascites: Secondary | ICD-10-CM

## 2019-08-16 DIAGNOSIS — G8929 Other chronic pain: Secondary | ICD-10-CM | POA: Diagnosis present

## 2019-08-16 DIAGNOSIS — E46 Unspecified protein-calorie malnutrition: Secondary | ICD-10-CM | POA: Diagnosis present

## 2019-08-16 DIAGNOSIS — I81 Portal vein thrombosis: Secondary | ICD-10-CM | POA: Diagnosis present

## 2019-08-16 DIAGNOSIS — Z20822 Contact with and (suspected) exposure to covid-19: Secondary | ICD-10-CM | POA: Diagnosis present

## 2019-08-16 DIAGNOSIS — Z6829 Body mass index (BMI) 29.0-29.9, adult: Secondary | ICD-10-CM

## 2019-08-16 DIAGNOSIS — R066 Hiccough: Secondary | ICD-10-CM | POA: Diagnosis not present

## 2019-08-16 DIAGNOSIS — F1010 Alcohol abuse, uncomplicated: Secondary | ICD-10-CM | POA: Diagnosis present

## 2019-08-16 DIAGNOSIS — Z8249 Family history of ischemic heart disease and other diseases of the circulatory system: Secondary | ICD-10-CM

## 2019-08-16 DIAGNOSIS — E871 Hypo-osmolality and hyponatremia: Secondary | ICD-10-CM | POA: Diagnosis not present

## 2019-08-16 DIAGNOSIS — Z79899 Other long term (current) drug therapy: Secondary | ICD-10-CM

## 2019-08-16 DIAGNOSIS — K766 Portal hypertension: Secondary | ICD-10-CM | POA: Diagnosis present

## 2019-08-16 DIAGNOSIS — Z9114 Patient's other noncompliance with medication regimen: Secondary | ICD-10-CM

## 2019-08-16 DIAGNOSIS — R160 Hepatomegaly, not elsewhere classified: Secondary | ICD-10-CM | POA: Diagnosis present

## 2019-08-16 DIAGNOSIS — J90 Pleural effusion, not elsewhere classified: Secondary | ICD-10-CM | POA: Diagnosis present

## 2019-08-16 DIAGNOSIS — F17210 Nicotine dependence, cigarettes, uncomplicated: Secondary | ICD-10-CM | POA: Diagnosis present

## 2019-08-16 DIAGNOSIS — D684 Acquired coagulation factor deficiency: Secondary | ICD-10-CM | POA: Diagnosis present

## 2019-08-16 DIAGNOSIS — Y92239 Unspecified place in hospital as the place of occurrence of the external cause: Secondary | ICD-10-CM | POA: Diagnosis not present

## 2019-08-16 DIAGNOSIS — K219 Gastro-esophageal reflux disease without esophagitis: Secondary | ICD-10-CM | POA: Diagnosis present

## 2019-08-16 DIAGNOSIS — T501X5A Adverse effect of loop [high-ceiling] diuretics, initial encounter: Secondary | ICD-10-CM | POA: Diagnosis not present

## 2019-08-16 DIAGNOSIS — I8510 Secondary esophageal varices without bleeding: Secondary | ICD-10-CM | POA: Diagnosis present

## 2019-08-16 DIAGNOSIS — K7030 Alcoholic cirrhosis of liver without ascites: Secondary | ICD-10-CM | POA: Diagnosis present

## 2019-08-16 DIAGNOSIS — K7040 Alcoholic hepatic failure without coma: Secondary | ICD-10-CM | POA: Diagnosis present

## 2019-08-16 DIAGNOSIS — F332 Major depressive disorder, recurrent severe without psychotic features: Secondary | ICD-10-CM | POA: Diagnosis present

## 2019-08-16 DIAGNOSIS — K7031 Alcoholic cirrhosis of liver with ascites: Principal | ICD-10-CM | POA: Diagnosis present

## 2019-08-16 DIAGNOSIS — D731 Hypersplenism: Secondary | ICD-10-CM | POA: Diagnosis present

## 2019-08-16 HISTORY — DX: Alcohol abuse, uncomplicated: F10.10

## 2019-08-16 LAB — FOLATE: Folate: 10.9 ng/mL (ref >5.9)

## 2019-08-16 LAB — CBC
HCT: 43.9 % (ref 36.0–46.0)
Hemoglobin: 14.7 g/dL (ref 12.0–15.0)
MCH: 37.4 pg — ABNORMAL HIGH (ref 26.0–34.0)
MCHC: 33.5 g/dL (ref 30.0–36.0)
MCV: 111.7 fL — ABNORMAL HIGH (ref 80.0–100.0)
Platelets: 138 10*3/uL — ABNORMAL LOW (ref 150–400)
RBC: 3.93 MIL/uL (ref 3.87–5.11)
RDW: 14.4 % (ref 11.5–15.5)
WBC: 4.6 10*3/uL (ref 4.0–10.5)
nRBC: 0 % (ref 0.0–0.2)

## 2019-08-16 LAB — COMPREHENSIVE METABOLIC PANEL
ALT: 36 U/L (ref 0–44)
AST: 182 U/L — ABNORMAL HIGH (ref 15–41)
Albumin: 2.3 g/dL — ABNORMAL LOW (ref 3.5–5.0)
Alkaline Phosphatase: 269 U/L — ABNORMAL HIGH (ref 38–126)
Anion gap: 12 (ref 5–15)
BUN: <5 mg/dL — ABNORMAL LOW (ref 6–20)
CO2: 22 mmol/L (ref 22–32)
Calcium: 7.8 mg/dL — ABNORMAL LOW (ref 8.9–10.3)
Chloride: 102 mmol/L (ref 98–111)
Creatinine, Ser: 0.4 mg/dL — ABNORMAL LOW (ref 0.44–1.00)
GFR calc Af Amer: >60 mL/min (ref >60)
GFR calc non Af Amer: >60 mL/min (ref >60)
Glucose, Bld: 109 mg/dL — ABNORMAL HIGH (ref 70–99)
Potassium: 4.1 mmol/L (ref 3.5–5.1)
Sodium: 136 mmol/L (ref 135–145)
Total Bilirubin: 6.2 mg/dL — ABNORMAL HIGH (ref 0.3–1.2)
Total Protein: 6.8 g/dL (ref 6.5–8.1)

## 2019-08-16 LAB — BILIRUBIN, FRACTIONATED(TOT/DIR/INDIR)
Bilirubin, Direct: 3.1 mg/dL — ABNORMAL HIGH (ref 0.0–0.2)
Indirect Bilirubin: 3.4 mg/dL — ABNORMAL HIGH (ref 0.3–0.9)
Total Bilirubin: 6.5 mg/dL — ABNORMAL HIGH (ref 0.3–1.2)

## 2019-08-16 LAB — TROPONIN I (HIGH SENSITIVITY)
Troponin I (High Sensitivity): 7 ng/L (ref <18)
Troponin I (High Sensitivity): 7 ng/L (ref <18)

## 2019-08-16 LAB — HIV ANTIBODY (ROUTINE TESTING W REFLEX): HIV Screen 4th Generation wRfx: NONREACTIVE

## 2019-08-16 LAB — VITAMIN B12: Vitamin B-12: 1182 pg/mL — ABNORMAL HIGH (ref 180–914)

## 2019-08-16 LAB — PROTIME-INR
INR: 1.4 — ABNORMAL HIGH (ref 0.8–1.2)
Prothrombin Time: 16.8 seconds — ABNORMAL HIGH (ref 11.4–15.2)

## 2019-08-16 LAB — APTT: aPTT: 37 seconds — ABNORMAL HIGH (ref 24–36)

## 2019-08-16 LAB — LIPASE, BLOOD: Lipase: 32 U/L (ref 11–51)

## 2019-08-16 LAB — GAMMA GT: GGT: 855 U/L — ABNORMAL HIGH (ref 7–50)

## 2019-08-16 LAB — SARS CORONAVIRUS 2 BY RT PCR (HOSPITAL ORDER, PERFORMED IN ~~LOC~~ HOSPITAL LAB): SARS Coronavirus 2: NEGATIVE

## 2019-08-16 LAB — ETHANOL: Alcohol, Ethyl (B): 313 mg/dL (ref <10)

## 2019-08-16 LAB — BRAIN NATRIURETIC PEPTIDE: B Natriuretic Peptide: 71.3 pg/mL (ref 0.0–100.0)

## 2019-08-16 IMAGING — DX DG CHEST 1V PORT
1 series · 1 of 1 positions shown · non-contrast
Comparison: [DATE]

CLINICAL DATA: Shortness of breath with lower extremity edema

EXAM:
PORTABLE CHEST 1 VIEW

[chest]
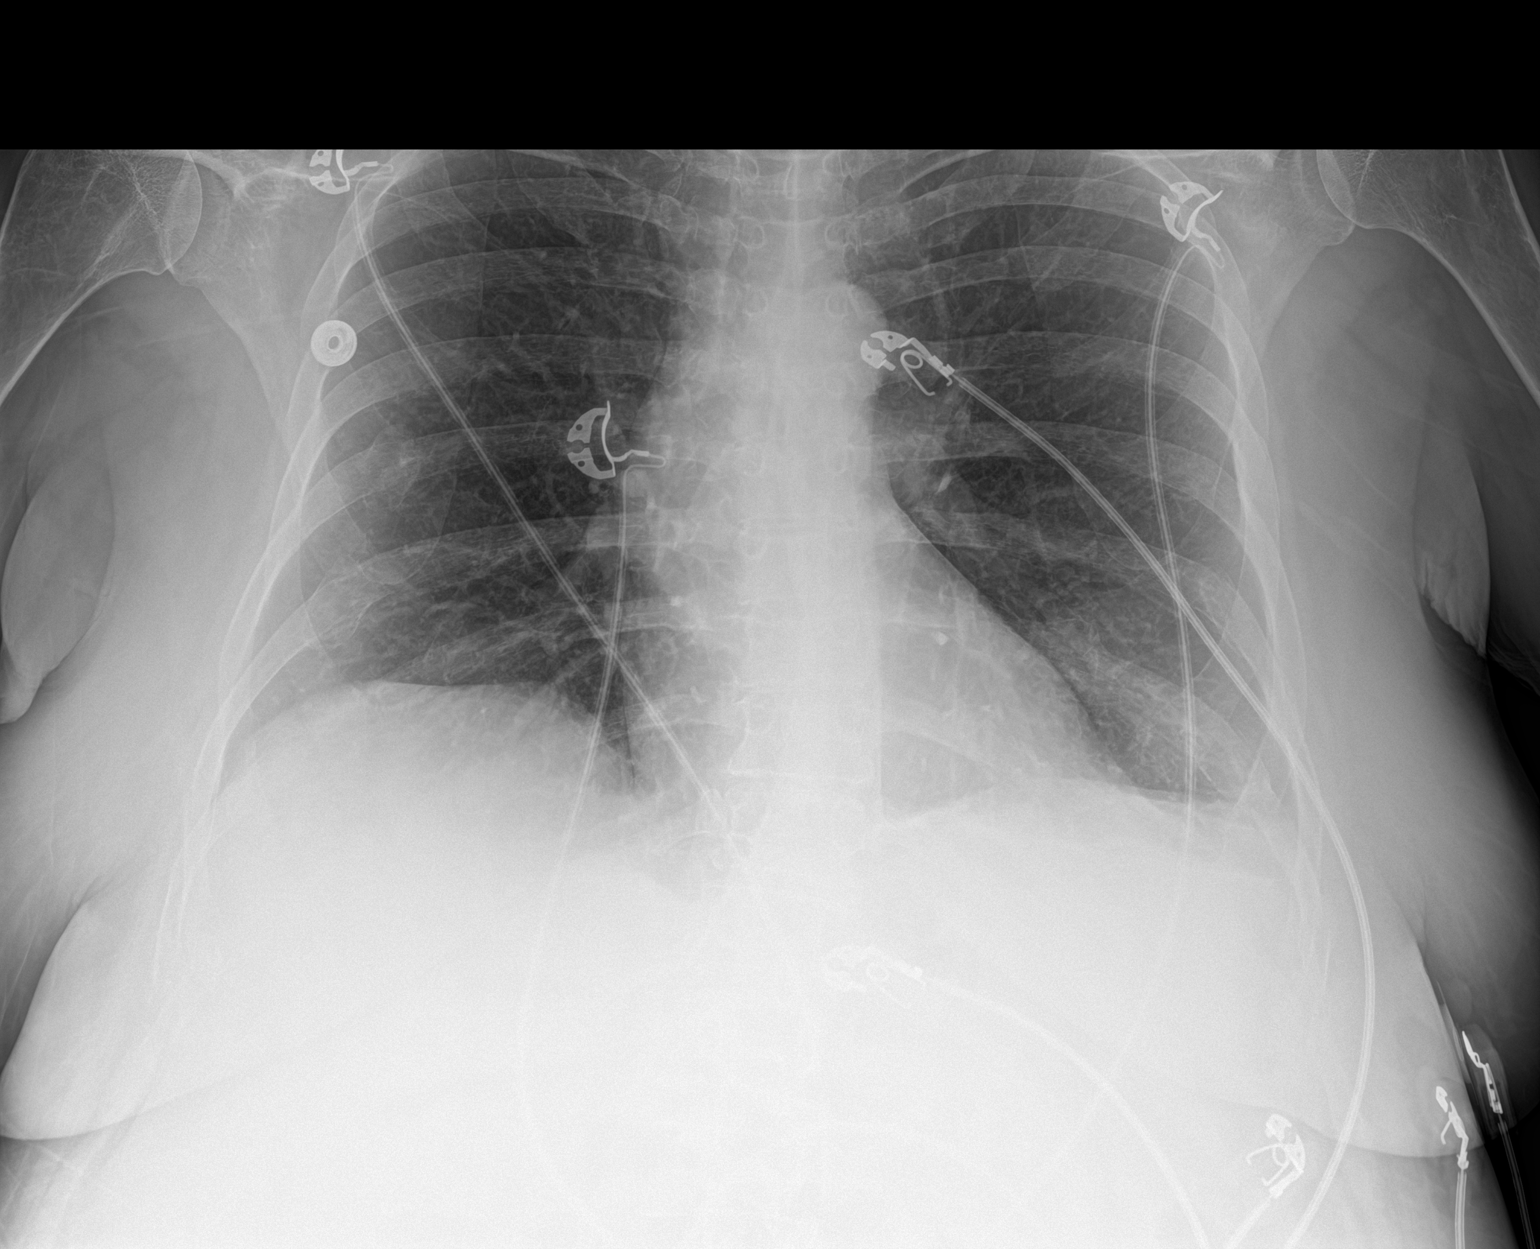

[1 of 1 positions shown; findings below may reference images not displayed]

FINDINGS: Trachea midline. Cardiomediastinal contours and hilar structures are
normal.

Lungs are clear.

Mild blunting of LEFT costodiaphragmatic sulcus.

Signs of prior trauma to RIGHT fifth and sixth ribs posteriorly.

No dense consolidation.

No acute process with respect to visualized skeletal structures on
limited assessment.
IMPRESSION: Question trace LEFT pleural effusion, no acute finding otherwise in
the chest.

## 2019-08-16 MED ORDER — SPIRONOLACTONE 25 MG PO TABS
50.0000 mg | ORAL_TABLET | Freq: Every day | ORAL | Status: DC
  Administered 2019-08-16 – 2019-08-18 (×3): 50 mg via ORAL
  Filled 2019-08-16 (×3): qty 2

## 2019-08-16 MED ORDER — ENOXAPARIN SODIUM 40 MG/0.4ML ~~LOC~~ SOLN
40.0000 mg | SUBCUTANEOUS | Status: DC
  Administered 2019-08-17 – 2019-08-22 (×5): 40 mg via SUBCUTANEOUS
  Filled 2019-08-16 (×6): qty 0.4

## 2019-08-16 MED ORDER — FUROSEMIDE 10 MG/ML IJ SOLN
40.0000 mg | Freq: Every day | INTRAMUSCULAR | Status: DC
  Administered 2019-08-16 – 2019-08-17 (×2): 40 mg via INTRAVENOUS
  Filled 2019-08-16 (×2): qty 4

## 2019-08-16 MED ORDER — FENTANYL CITRATE (PF) 100 MCG/2ML IJ SOLN
25.0000 ug | Freq: Once | INTRAMUSCULAR | Status: AC
  Administered 2019-08-16: 25 ug via INTRAVENOUS
  Filled 2019-08-16: qty 2

## 2019-08-16 MED ORDER — FOLIC ACID 1 MG PO TABS
1.0000 mg | ORAL_TABLET | Freq: Every day | ORAL | Status: DC
  Administered 2019-08-16 – 2019-08-23 (×8): 1 mg via ORAL
  Filled 2019-08-16 (×8): qty 1

## 2019-08-16 MED ORDER — FUROSEMIDE 10 MG/ML IJ SOLN: 40.0000 mg | Freq: Two times a day (BID) | INTRAMUSCULAR | Status: DC

## 2019-08-16 MED ORDER — ONDANSETRON HCL 4 MG/2ML IJ SOLN
4.0000 mg | Freq: Once | INTRAMUSCULAR | Status: AC | PRN
  Administered 2019-08-16: 4 mg via INTRAVENOUS
  Filled 2019-08-16: qty 2

## 2019-08-16 MED ORDER — THIAMINE HCL 100 MG/ML IJ SOLN
250.0000 mg | Freq: Every day | INTRAVENOUS | Status: DC
  Administered 2019-08-16 – 2019-08-19 (×4): 250 mg via INTRAVENOUS
  Filled 2019-08-16 (×5): qty 2.5

## 2019-08-16 MED ORDER — ENOXAPARIN SODIUM 40 MG/0.4ML ~~LOC~~ SOLN: 40.0000 mg | SUBCUTANEOUS | Status: DC

## 2019-08-16 NOTE — ED Triage Notes (Addendum)
Pt arrives to ED w/ c/o BLE edema and abdominal distention. Pt eyes mildly jaundiced in triage. Pt also reports dark urine and a 20 lb weight gain over the last two months. Pt states she was seen at [facility] and they gave her lasix but it did not work. Pt reports 10/10 abdominal pain. Pt denies n/v/d. States she has been having to take laxatives, last BM this AM.

## 2019-08-16 NOTE — ED Notes (Signed)
Report given to 4E RN. All questions answered 

## 2019-08-16 NOTE — ED Provider Notes (Signed)
[place] EMERGENCY DEPARTMENT Provider Note   CSN: [id number] Arrival date & time: 08/16/19  1111     History Chief Complaint  Patient presents with   Leg Swelling    Kayla Sandoval is a 49 y.o. female.  49 year old female presents with diffuse body pain.  Patient seen a month ago and diagnosed with alcoholic cirrhosis but did not follow-up.  States that she has had increased abdominal distention along with increased lower extremity edema.  Denies any hematemesis.  No black or bloody stools.  Has been more short of breath due to her abdominal girth.  States she hurts from her toes to the her earlobes.  Patient has been using Tylenol without relief        Past Medical History:  Diagnosis Date   Anxiety    Depression    Hypertension     Patient Active Problem List   Diagnosis Date Noted   Alcohol use disorder, moderate, dependence ([place]) 02/15/2016   Substance-induced psychotic disorder with hallucinations ([place]) 02/15/2016   History of head injury 02/15/2016   History of spinal fracture 02/15/2016   Feeling grief    MDD (major depressive disorder), recurrent severe, without psychosis ([place]) 04/08/2015   Acute stress disorder 04/08/2015   Insomnia 04/08/2015   Recent bereavement 04/07/2015   Alcohol withdrawal ([place]) 04/06/2015   CAP (community acquired pneumonia) 04/03/2015   Overdose 04/03/2015   Acute encephalopathy 04/03/2015   Acute respiratory failure with hypoxia ([place]) 04/03/2015   Alcohol intoxication ([place]) 04/03/2015   Tobacco use disorder 04/03/2015   Hemothorax on left 04/09/2011   Pleural effusion 03/27/2011   Pneumonia 03/27/2011   Tobacco abuse 03/27/2011   Hypertension 03/27/2011   Macrocytosis without anemia 03/27/2011   Depression 03/27/2011    Past Surgical History:  Procedure Laterality Date   chest thoracostomy       OB History   No obstetric history on file.     Family History  Problem  Relation Age of Onset   Depression Mother    Hypertension Mother     Social History   Tobacco Use   Smoking status: Current Every Day Smoker    Packs/day: 0.50    Years: 10.00    Pack years: 5.00    Types: Cigarettes   Smokeless tobacco: Never Used  Vaping Use   Vaping Use: Never used  Substance Use Topics   Alcohol use: Yes    Alcohol/week: 3.0 standard drinks    Types: 3 Cans of beer per week    Comment: nightly   Drug use: Not Currently    Types: Marijuana    Home Medications Prior to Admission medications   Medication Sig Start Date End Date Taking? Authorizing Provider  azithromycin (ZITHROMAX Z-PAK) 250 MG tablet 2 po day one, then 1 daily x 4 days 03/22/18   Veryl Speak, MD  citalopram (CELEXA) 10 MG tablet Take 1 tablet (10 mg total) by mouth daily. 02/17/16   Kerrie Buffalo, NP  cyclobenzaprine (FLEXERIL) 10 MG tablet Take 1 tablet (10 mg total) by mouth 2 (two) times daily as needed for muscle spasms. 02/21/18   Maudie Flakes, MD  furosemide (LASIX) 20 MG tablet Take 1 tablet (20 mg total) by mouth daily for 7 days. 07/23/19 07/30/19  Noemi Chapel, MD  hydrocortisone 2.5 % lotion Apply topically 2 (two) times daily. 10/07/17   Frederica Kuster, PA-C  hydrOXYzine (ATARAX/VISTARIL) 25 MG tablet Take 1 tablet (25 mg total) by mouth every  6 (six) hours as needed (anxiety/agitation or CIWA < or = 10). 02/16/16   Kerrie Buffalo, NP  ibuprofen (ADVIL) 800 MG tablet Take 1 tablet (800 mg total) by mouth every 6 (six) hours as needed for moderate pain. 12/25/18   Orpah Greek, MD  lidocaine (LIDODERM) 5 % Place 1 patch onto the skin daily. Remove & Discard patch within 12 hours or as directed by MD 02/21/18   Maudie Flakes, MD  lisinopril (PRINIVIL,ZESTRIL) 10 MG tablet Take 1 tablet (10 mg total) by mouth daily. 10/07/17   Law, Bea Graff, PA-C  naproxen (NAPROSYN) 500 MG tablet Take 1 tablet (500 mg total) by mouth 2 (two) times daily. 02/21/18   Maudie Flakes,  MD  nicotine (NICODERM CQ - DOSED IN MG/24 HOURS) 14 mg/24hr patch Place 1 patch (14 mg total) onto the skin daily. 02/17/16   Kerrie Buffalo, NP  pseudoephedrine (SUDAFED 12 HOUR) 120 MG 12 hr tablet Take 1 tablet (120 mg total) by mouth 2 (two) times daily. 02/21/18   Maudie Flakes, MD  QUEtiapine (SEROQUEL) 25 MG tablet Take 1 tablet (25 mg total) by mouth at bedtime. 02/16/16   Kerrie Buffalo, NP  Saccharomyces boulardii (PROBIOTIC) 250 MG CAPS Take 1 capsule by mouth daily. 10/07/17   Law, Bea Graff, PA-C  traMADol (ULTRAM) 50 MG tablet Take 1 tablet (50 mg total) by mouth every 6 (six) hours as needed. 12/25/18   Orpah Greek, MD  traZODone (DESYREL) 50 MG tablet Take 1 tablet (50 mg total) by mouth at bedtime as needed for sleep. 02/16/16   Kerrie Buffalo, NP    Allergies    Patient has no known allergies.  Review of Systems   Review of Systems  All other systems reviewed and are negative.   Physical Exam Updated Vital Signs BP (!) 142/96    Pulse (!) 126    Temp 98.1 F (36.7 C) (Oral)    Resp 18    Ht 1.626 m (5\' 4" )    Wt 77.1 kg    SpO2 94%    BMI 29.18 kg/m   Physical Exam Vitals and nursing note reviewed.  Constitutional:      General: She is not in acute distress.    Appearance: Normal appearance. She is well-developed. She is not toxic-appearing.  HENT:     Head: Normocephalic and atraumatic.  Eyes:     General: Lids are normal. Scleral icterus present.     Conjunctiva/sclera: Conjunctivae normal.     Pupils: Pupils are equal, round, and reactive to light.  Neck:     Thyroid: No thyroid mass.     Trachea: No tracheal deviation.  Cardiovascular:     Rate and Rhythm: Normal rate and regular rhythm.     Heart sounds: Normal heart sounds. No murmur heard.  No gallop.   Pulmonary:     Effort: Pulmonary effort is normal. No respiratory distress.     Breath sounds: Normal breath sounds. No stridor. No decreased breath sounds, wheezing, rhonchi or rales.   Abdominal:     General: Bowel sounds are normal. There is distension.     Palpations: There is fluid wave.     Tenderness: There is no abdominal tenderness. There is no rebound.  Musculoskeletal:        General: No tenderness. Normal range of motion.     Cervical back: Normal range of motion and neck supple.  Lymphadenopathy:     Comments: Bilateral le edema  Skin:    General: Skin is warm and dry.     Findings: No abrasion or rash.  Neurological:     Mental Status: She is alert and oriented to person, place, and time.     GCS: GCS eye subscore is 4. GCS verbal subscore is 5. GCS motor subscore is 6.     Cranial Nerves: No cranial nerve deficit.     Sensory: No sensory deficit.  Psychiatric:        Speech: Speech normal.        Behavior: Behavior normal.     ED Results / Procedures / Treatments   Labs (all labs ordered are listed, but only abnormal results are displayed) Labs Reviewed  CBC  COMPREHENSIVE METABOLIC PANEL  BRAIN NATRIURETIC PEPTIDE  URINALYSIS, ROUTINE W REFLEX MICROSCOPIC  LIPASE, BLOOD  ETHANOL  PROTIME-INR  APTT  TROPONIN I (HIGH SENSITIVITY)    EKG None ED ECG REPORT   Date: 08/16/2019  Rate: 119  Rhythm: sinus tachycardia  QRS Axis: normal  Intervals: normal  ST/T Wave abnormalities: ST elevations anteriorly  Conduction Disutrbances:none  Narrative Interpretation:   Old EKG Reviewed: none available  I have personally reviewed the EKG tracing and agree with the computerized printout as noted.  Radiology No results found.  Procedures Procedures (including critical care time)  Medications Ordered in ED Medications - No data to display  ED Course  I have reviewed the triage vital signs and the nursing notes.  Pertinent labs & imaging results that were available during my care of the patient were reviewed by me and considered in my medical decision making (see chart for details).    MDM Rules/Calculators/A&P                           Patient's initial EKG showed sinus tach with some ST elevation but repeat EKG just showed sinus tach.  Patient's first troponin was negative.  Patient medicated for pain with fentanyl.  Patient's labs consistent with likely cirrhosis.  She has no abdominal discomfort at this time.  Low suspicion for SBP.  Will admit to the medical service  CRITICAL CARE Performed by: Leota Jacobsen Total critical care time: 45 minutes Critical care time was exclusive of separately billable procedures and treating other patients. Critical care was necessary to treat or prevent imminent or life-threatening deterioration. Critical care was time spent personally by me on the following activities: development of treatment plan with patient and/or surrogate as well as nursing, discussions with consultants, evaluation of patient's response to treatment, examination of patient, obtaining history from patient or surrogate, ordering and performing treatments and interventions, ordering and review of laboratory studies, ordering and review of radiographic studies, pulse oximetry and re-evaluation of patient's condition.  Final Clinical Impression(s) / ED Diagnoses Final diagnoses:  None    Rx / DC Orders ED Discharge Orders    None       Lacretia Leigh, MD 08/16/19 1305

## 2019-08-16 NOTE — H&P (Addendum)
Date: 08/16/2019               Patient Name:  Kayla Sandoval MRN: [id number]  DOB: February 13, 1970 Age / Sex: 49 y.o., female   PCP: Patient, No Pcp Per         Medical Service: Internal Medicine Teaching Service         Attending Physician: Dr. Rebeca Alert Raynaldo Opitz, MD    First Contact: Dr. Alfonse Spruce Pager: [phone]  Second Contact: Dr. Eileen Stanford Pager: [phone]       After Hours (After 5p/  First Contact Pager: [phone]  weekends / holidays): Second Contact Pager: [phone]   Chief Complaint: Bilateral lower extremity edema and abdominal distention   History of Present Illness: patient is a 49 yo WF with PMH of alcohol abuse, cirrhosis with prior history of cirrhosis, HTN, depression, anxiety, admitted to the hospital for complaints of bilateral lower extremity edema, abdominal distention, and fluid leakage from abdomen. Patient reports a weight gain of 20 lbs in the last 2 months. Her daughter states that she noticed yellowing of patient's skin approximately one month ago. She complains of diffuse abdominal pain . She denies chest pain or shortness of breath at the moment. Patient reports drinking about 8 beers per day, which helps her sleep. Her last drink was this morning which she drank 2 beer. She states that she never had history of alcohol withdraw.  Patient was in [facility] hospital ED in June 2021 for the same complain and was discharged with 20 mg Lasix, which she states that it does not help. Patient does not see any physicians because she does not have any insurance. She states that the only medication that she is taking is Trazodone which she has since 2018. She is living at home with her mom.    Meds:  Current Meds  Medication Sig  . acetaminophen (TYLENOL) 500 MG tablet Take 1,000 mg by mouth every 6 (six) hours as needed for mild pain.  . Melatonin 10 MG TABS Take 10 mg by mouth at bedtime as needed (sleep).  . traZODone (DESYREL) 50 MG tablet Take 1 tablet (50 mg total) by mouth  at bedtime as needed for sleep.     Allergies: Allergies as of 08/16/2019  . (No Known Allergies)   Past Medical History:  Diagnosis Date  . Anxiety   . Depression   . Hypertension     Family History:  Mother: HTN, depression  Social History:  Alcohol: 8 beers a day Tobacco: former smoker  Review of Systems: Review of Systems  Constitutional: Negative for weight loss.       Weight gain  HENT: Negative for hearing loss.   Respiratory: Negative for shortness of breath.   Cardiovascular: Positive for leg swelling. Negative for chest pain and palpitations.  Gastrointestinal: Positive for abdominal pain.  Musculoskeletal: Positive for back pain.  Skin: Negative for itching.       jaundice  Neurological: Negative for weakness.  Psychiatric/Behavioral: The patient is nervous/anxious and has insomnia.        Alcohol abuse    Physical Exam: Blood pressure 112/80, pulse (!) 102, temperature 98.1 F (36.7 C), temperature source Oral, resp. rate 13, height 5\' 4"  (1.626 m), weight 77.1 kg, SpO2 97 %.  Physical Exam HENT:     Head: Normocephalic.  Eyes:     General: Scleral icterus present.  Cardiovascular:     Rate and Rhythm: Regular rhythm. Tachycardia present.  Heart sounds: Normal heart sounds.  Pulmonary:     Breath sounds: Normal breath sounds.     Comments: [place] to percussion left lower lobe Abdominal:     General: There is distension.     Tenderness: There is abdominal tenderness.     Comments: Fluid wave. No caput medusa noted  Musculoskeletal:        General: Normal range of motion.     Right lower leg: Edema present.     Left lower leg: Edema present.     Comments: +2 bilateral lower extremity edema  Skin:    General: Skin is warm.     Coloration: Skin is jaundiced.  Neurological:     Mental Status: She is alert.     Comments: Not encephalopathic      EKG: personally reviewed my interpretation is Sinus tachycardia. Possible ST elevation  lead I, II     Assessment & Plan by Problem:  49 yo WF with PMH of alcohol abuse, cirrhosis with prior history of cirrhosis, HTN, depression, anxiety, admitted to the hospital for complaints of bilateral lower extremity edema and abdominal distention.  Active Problems:   Decompensated liver disease (HCC)  Decompensated liver disease - Likely alcoholic cirrhosis given history of alcohol abuse and AST/ALT ratio of 2 - Patient has prior history cirrhosis and ascites in June but patient failed to follow up with PCP after discharge.  - CT abd/pel (07/2019) showed moderate ascites, cirrhosis and portal hypertension, and indeterminate areas of low attenuation within the liver, possibly small liver masses.  - Paracentesis ordered place for diagnostic and therapeutic purpose  - Start Spironolactone 50 mg and Lasix 40 mg daily for diuresis. Monitor I/O and daily wt.  - Patient does not need steroid given Maddrey's Discriminant score of 28.3. - GI consulted and onboard with the treatment plan  Liver mass - CT abd/pel (07/2019) showed moderate ascites, cirrhosis and portal hypertension, and indeterminate areas of low attenuation within the liver, possibly small liver masses.  - MRI of the abdomen is recommended when patient is stable or outpatient. - AFP pending   Pleural Effusion - CXR shows questionable LLL effusion - Will reassess after diuresis  Alcohol abuse - Blood Ethanol lv 313 - Add Thiamine and Folate  - CIWA protocol without Ativan in place  HTN - BP is in normal range - Continue to monitor  Diet: thin fluid IVF: NA CODE: Full DVT: Lovenox  Dispo: Admit patient to Inpatient with expected length of stay greater than 2 midnights.  Signed: Gaylan Gerold, DO 08/16/2019, 2:16 PM  Pager: [phone] After 5pm on weekdays and 1pm on weekends: On Call pager: [phone]

## 2019-08-16 NOTE — ED Notes (Signed)
Pt oxygen drops to 88% on room air, 2L [place] applied and oxygen increased to 96%

## 2019-08-16 NOTE — Progress Notes (Signed)
Pt received from ED to 4e27. Oriented to room and call bell. CHG bath completed. Pt connected to tele box and CCMD called. VSS. Call bell in reach. Will continue to monitor.  Arletta Bale, RN

## 2019-08-17 ENCOUNTER — Inpatient Hospital Stay (HOSPITAL_COMMUNITY): Payer: Self-pay

## 2019-08-17 DIAGNOSIS — K7469 Other cirrhosis of liver: Secondary | ICD-10-CM

## 2019-08-17 HISTORY — PX: IR PARACENTESIS: IMG2679

## 2019-08-17 LAB — COMPREHENSIVE METABOLIC PANEL
ALT: 30 U/L (ref 0–44)
AST: 161 U/L — ABNORMAL HIGH (ref 15–41)
Albumin: 2 g/dL — ABNORMAL LOW (ref 3.5–5.0)
Alkaline Phosphatase: 223 U/L — ABNORMAL HIGH (ref 38–126)
Anion gap: 10 (ref 5–15)
BUN: <5 mg/dL — ABNORMAL LOW (ref 6–20)
CO2: 24 mmol/L (ref 22–32)
Calcium: 7.4 mg/dL — ABNORMAL LOW (ref 8.9–10.3)
Chloride: 101 mmol/L (ref 98–111)
Creatinine, Ser: 0.37 mg/dL — ABNORMAL LOW (ref 0.44–1.00)
GFR calc Af Amer: >60 mL/min (ref >60)
GFR calc non Af Amer: >60 mL/min (ref >60)
Glucose, Bld: 91 mg/dL (ref 70–99)
Potassium: 3.8 mmol/L (ref 3.5–5.1)
Sodium: 135 mmol/L (ref 135–145)
Total Bilirubin: 5.8 mg/dL — ABNORMAL HIGH (ref 0.3–1.2)
Total Protein: 5.8 g/dL — ABNORMAL LOW (ref 6.5–8.1)

## 2019-08-17 LAB — CBC
HCT: 35.8 % — ABNORMAL LOW (ref 36.0–46.0)
Hemoglobin: 12.3 g/dL (ref 12.0–15.0)
MCH: 37.3 pg — ABNORMAL HIGH (ref 26.0–34.0)
MCHC: 34.4 g/dL (ref 30.0–36.0)
MCV: 108.5 fL — ABNORMAL HIGH (ref 80.0–100.0)
Platelets: 124 10*3/uL — ABNORMAL LOW (ref 150–400)
RBC: 3.3 MIL/uL — ABNORMAL LOW (ref 3.87–5.11)
RDW: 14 % (ref 11.5–15.5)
WBC: 5.1 10*3/uL (ref 4.0–10.5)
nRBC: 0 % (ref 0.0–0.2)

## 2019-08-17 LAB — URINALYSIS, ROUTINE W REFLEX MICROSCOPIC
Bilirubin Urine: NEGATIVE
Glucose, UA: NEGATIVE mg/dL
Hgb urine dipstick: NEGATIVE
Ketones, ur: NEGATIVE mg/dL
Leukocytes,Ua: NEGATIVE
Nitrite: NEGATIVE
Protein, ur: NEGATIVE mg/dL
Specific Gravity, Urine: 1.006 (ref 1.005–1.030)
pH: 6 (ref 5.0–8.0)

## 2019-08-17 LAB — BODY FLUID CELL COUNT WITH DIFFERENTIAL
Eos, Fluid: 0 %
Lymphs, Fluid: 17 %
Monocyte-Macrophage-Serous Fluid: 81 % (ref 50–90)
Neutrophil Count, Fluid: 2 % (ref 0–25)
Total Nucleated Cell Count, Fluid: 158 cu mm (ref 0–1000)

## 2019-08-17 LAB — PROTEIN, PLEURAL OR PERITONEAL FLUID: Total protein, fluid: <3 g/dL

## 2019-08-17 LAB — GRAM STAIN

## 2019-08-17 LAB — ALBUMIN, PLEURAL OR PERITONEAL FLUID: Albumin, Fluid: <1 g/dL

## 2019-08-17 LAB — GLUCOSE, PLEURAL OR PERITONEAL FLUID: Glucose, Fluid: 91 mg/dL

## 2019-08-17 LAB — HEPATITIS B CORE ANTIBODY, TOTAL: Hep B Core Total Ab: NONREACTIVE

## 2019-08-17 LAB — GAMMA GT: GGT: 739 U/L — ABNORMAL HIGH (ref 7–50)

## 2019-08-17 LAB — HEPATITIS C ANTIBODY: HCV Ab: NONREACTIVE

## 2019-08-17 IMAGING — MR MR ABDOMEN WO/W CM
10 of 19 series · 21 of 48 positions shown · IV contrast (7.5 M GAD)
Comparison: Abdominal sonogram [DATE]

CLINICAL DATA: Evaluation of liver mass in a patient with history
of cirrhosis. Abnormality discovered on abdominal sonogram.

EXAM:
MRI ABDOMEN WITHOUT AND WITH CONTRAST
TECHNIQUE: Multiplanar multisequence MR imaging of the abdomen was performed
both before and after the administration of intravenous contrast.
CONTRAST:  7.5mL GADAVIST GADOBUTROL 1 MMOL/ML IV SOLN

[Series 3: cor ssfse nav · coronal · 6.0mm · 0.78mm/px · 1 of 44 slices shown]
[im 1/44]
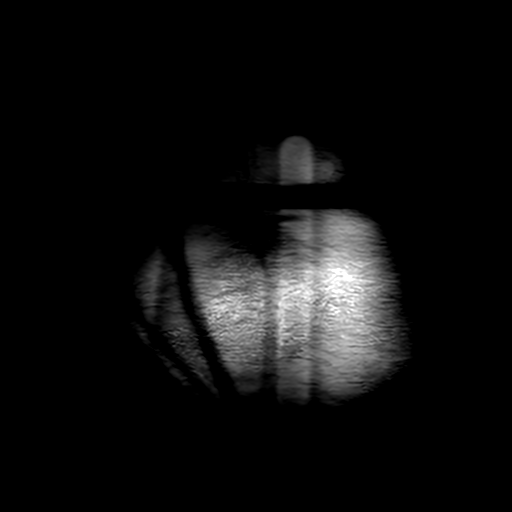

[Series 4: ax ssfse nav · axial · 6.0mm · 0.74mm/px · 1 of 62 slices shown]
[im 1/62]
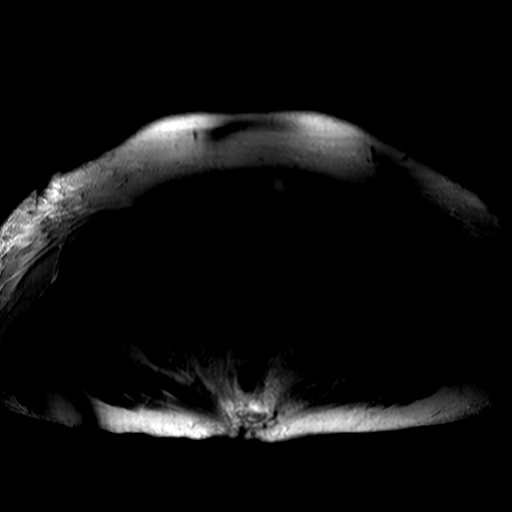

[Series 5: T2 fat-sat · axial · 6.0mm · 0.74mm/px · 1 of 62 slices shown]
[im 1/62]
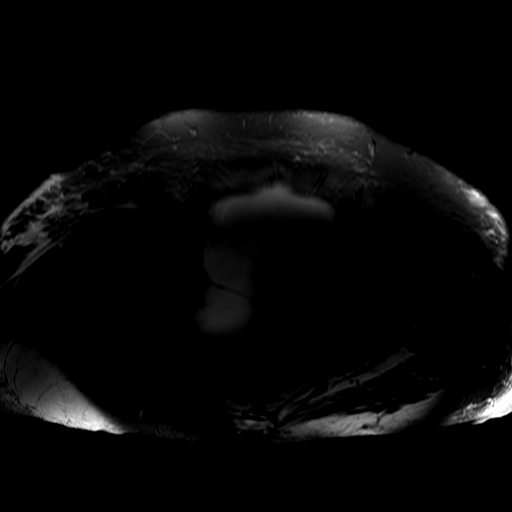

[Series 6: DWI b500 · axial · 8.0mm · 1.76mm/px · z∈[-128,+232]mm · 2 of 74 slices shown]
[im 1/74]
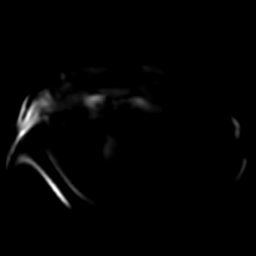
[im 74/74]
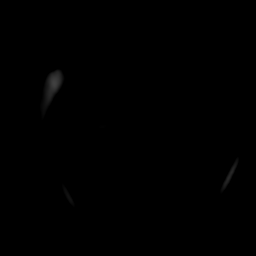

[Series 9: T1 dynamic · coronal · 3.4mm · 1.56mm/px · 3 of 144 slices shown]
[im 1/144]
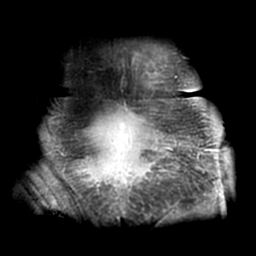
[im 72/144]
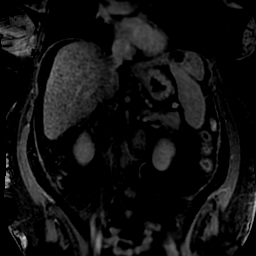
[im 144/144]
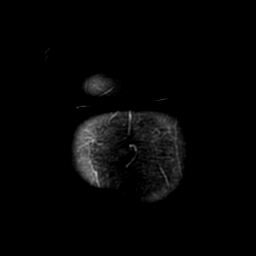

[Series 650: ADC · axial · 8.0mm · 1.76mm/px · 1 of 37 slices shown]
[im 1/37]
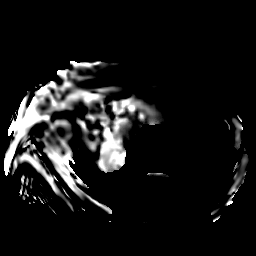

[Series 800: T1 dynamic post-contrast · axial · non-contrast · 4.0mm · 0.86mm/px · z∈[-38,+224]mm · 3 of 132 slices shown (1 of 4)]
[im 1/132]
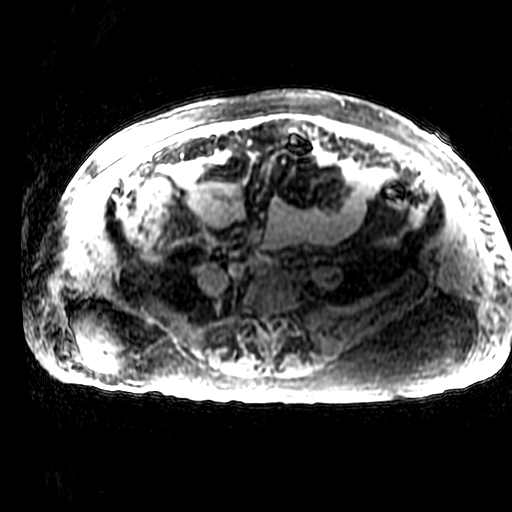
[im 66/132]
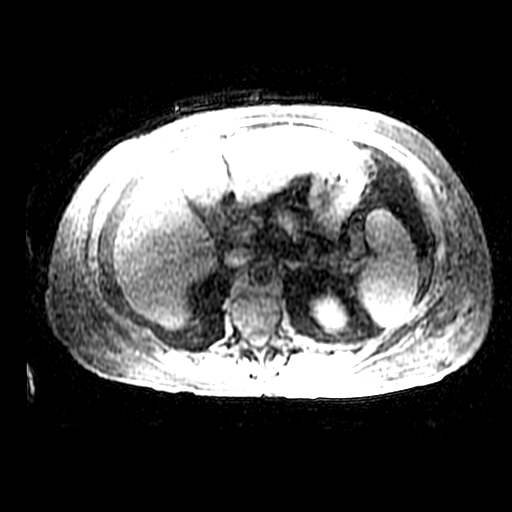
[im 132/132]
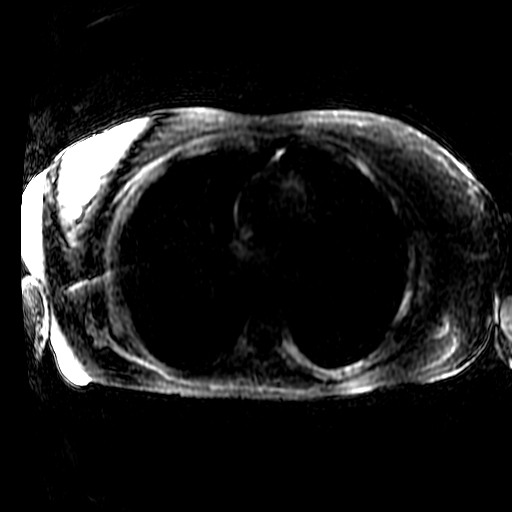

[Series 801: T1 dynamic post-contrast · axial · non-contrast · 4.0mm · 0.86mm/px · z∈[-38,+224]mm · 3 of 132 slices shown (2 of 4)]
[im 1/132]
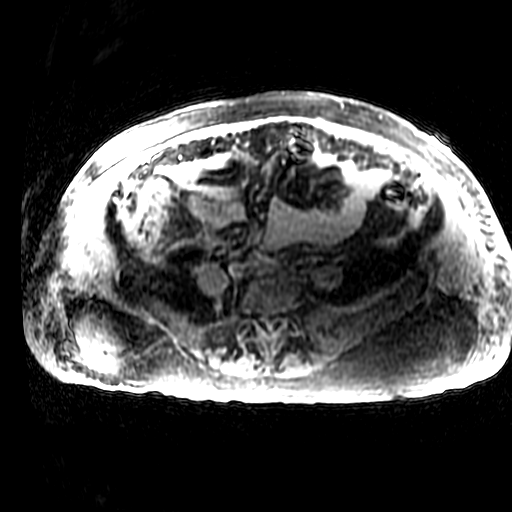
[im 66/132]
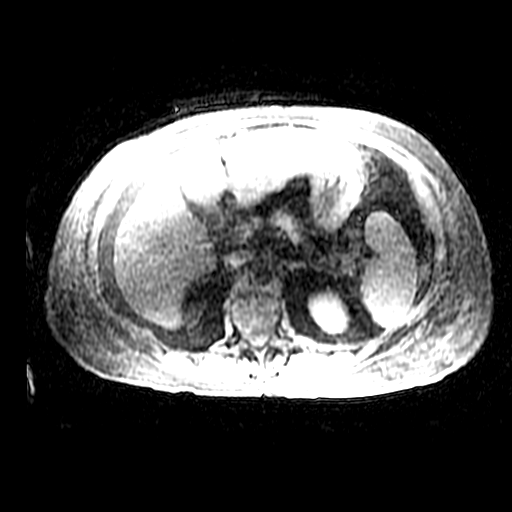
[im 132/132]
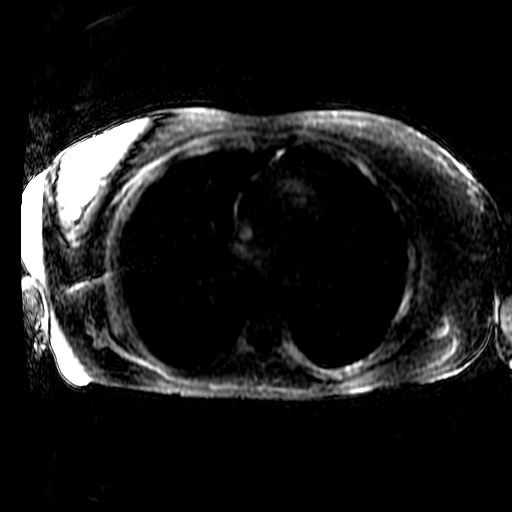

[Series 802: T1 dynamic post-contrast · axial · non-contrast · 4.0mm · 0.86mm/px · z∈[-38,+224]mm · 3 of 132 slices shown (3 of 4)]
[im 1/132]
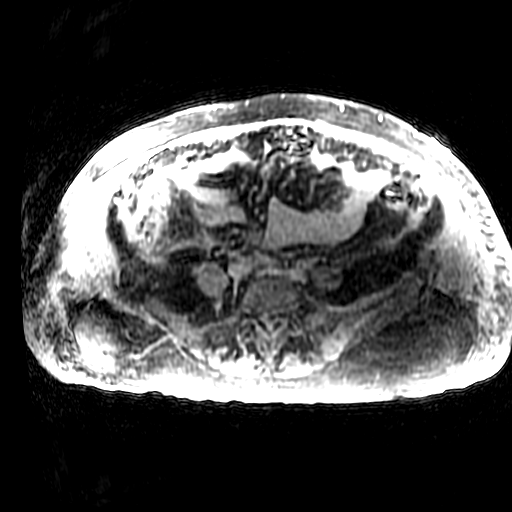
[im 66/132]
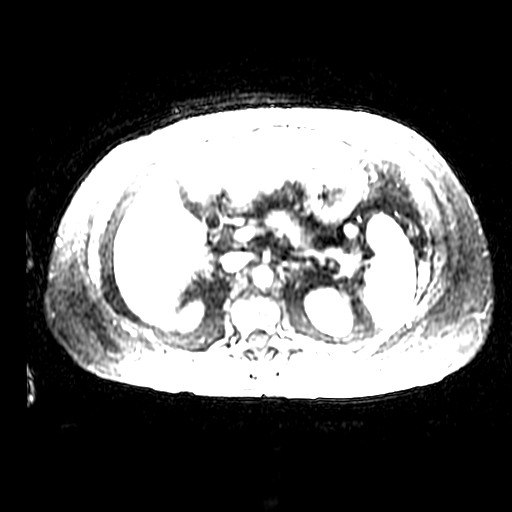
[im 132/132]
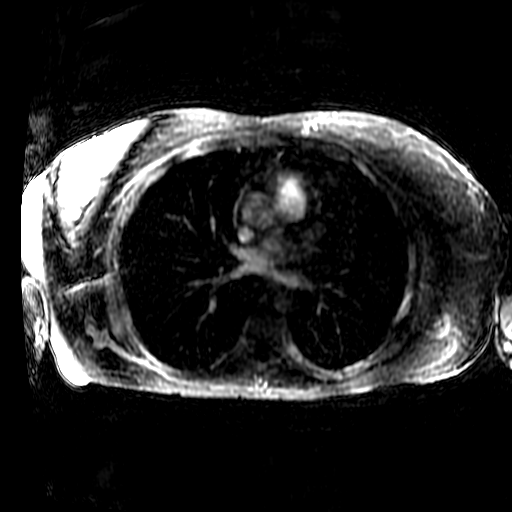

[Series 803: T1 dynamic post-contrast · axial · non-contrast · 4.0mm · 0.86mm/px · z∈[-38,+224]mm · 3 of 132 slices shown (4 of 4)]
[im 1/132]
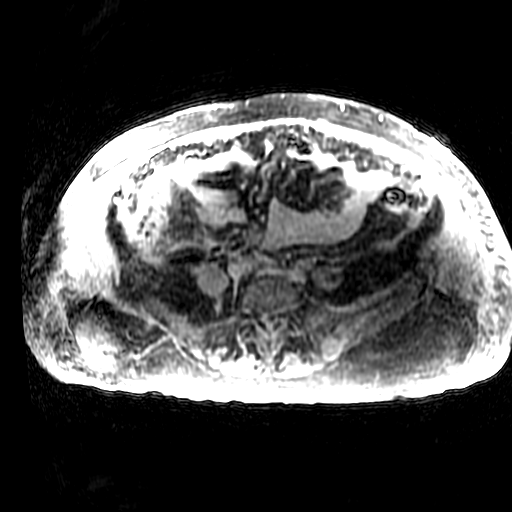
[im 66/132]
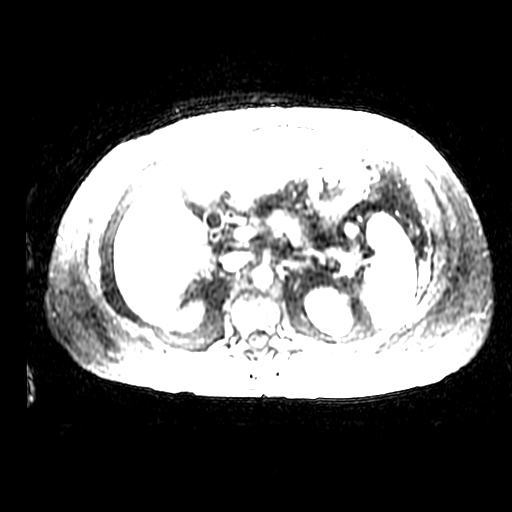
[im 132/132]
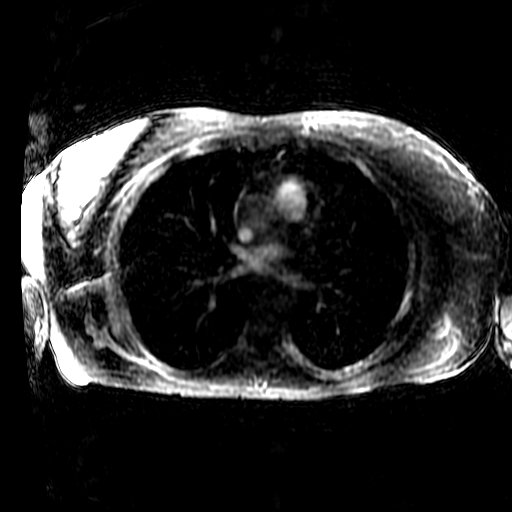

[21 of 48 positions shown; findings below may reference images not displayed]

FINDINGS: Lower chest: Small LEFT pleural effusion. No dense consolidation.
Limited assessment on MRI.

Hepatobiliary: Liver displays cirrhotic morphology. In the LEFT
portal vein there is irregularity along the anterior wall (image 49
of series 804) potentially related to small amount of thrombus. Main
portal vein is patent as is the SMV. The splenic vein is patent.
Portosystemic collaterals in the upper abdomen.

Cirrhotic and steatotic nodules.

Lesion in the LEFT hepatic lobe displays very little enhancement
mainly at the periphery in this is best seen on coronal imaging.
Lesion displayed characteristic features of hemangioma on a previous
imaging study and displays slightly increased T2 signal which is
limited on today's assessment in terms of T2 evaluation in the
setting of diffuse abdominal ascites.

Vague area of abnormality in the posterior RIGHT hepatic lobe 2.5 x
2.1 cm on the axial dataset. Note that the imaging is acquired in
venous phase rather than late arterial. Perhaps patient condition
limiting bolus timing. This is also seen on coronal imaging. Best
displayed on image 45 of series 9. No additional lesion aside from
multiple nodular areas within the liver.

Gallbladder with mild distension. Sludge in the lumen. Mild wall
thickening. No specific signs of cholecystitis however.

Pancreas: Pancreas without signs of peripancreatic stranding beyond
the amount of edema that is seen elsewhere in the abdomen in the
setting of portal hypertension.

Spleen: Spleen enlarged approximately 13-14 cm greatest craniocaudal
dimension.

Adrenals/Urinary Tract: Normal adrenal glands with smooth renal
contours. No suspicious renal lesion.

Stomach/Bowel: Diffuse bowel edema in the setting of portal
hypertension. RIGHT colon is particularly thickened.

Vascular/Lymphatic: Patent abdominal vasculature. Question small
thrombus in LEFT portal venous branch as discussed.

Other:  Volume of ascites is similar to recent CT from [DATE]

Musculoskeletal: No suspicious bone lesions identified.
IMPRESSION: 1. Study limited by abdominal ascites. Focal area within the
posterior RIGHT hepatic lobe suspicious for hepatic neoplasm. Given
lack of good late arterial phase imaging features suggest either
TIGER 5 or LR M lesion. Would suggest short interval follow-up
with CT as the amount of ascites limits assessment at 3 tesla in
this patient. The liver is overall heterogeneous and there may be
other lesions that could be missed given lack of late arterial
phase.
2. Lesion in the LEFT hepatic lobe displays characteristic features
of a hemangioma likely partially sclerosed based on comparison with
previous imaging.
3. Cirrhosis with stigmata of portal hypertension including
splenomegaly and varices.
4. RIGHT colonic thickening greater than other areas of bowel edema
may represent portal colopathy and changes of portal hypertension.
5. Question portal venous thrombus now with only minimal thrombus
potentially in the LEFT portal vein. SMV and main portal vein appear
patent. Perhaps very slow flow within the portal vein cause lack of
color Doppler due to technical factors on previous ultrasound.
6. Sludge in the gallbladder with mild wall thickening. No specific
signs of cholecystitis in this patient with portal hypertension and
cirrhosis. Gallbladder with unchanged appearance dating back to [REDACTED].
7. Small LEFT pleural effusion.

## 2019-08-17 IMAGING — US IR PARACENTESIS
1 series · 3 of 3 positions shown · non-contrast
Comparison: none

INDICATION: Patient history of alcoholic cirrhosis with abdominal distension.
Team is requesting therapeutic and diagnostic paracentesis

[Series 1: ir (id) (id)/(id)/(id) ir · 3 of 3 slices shown]
[im 1/3]
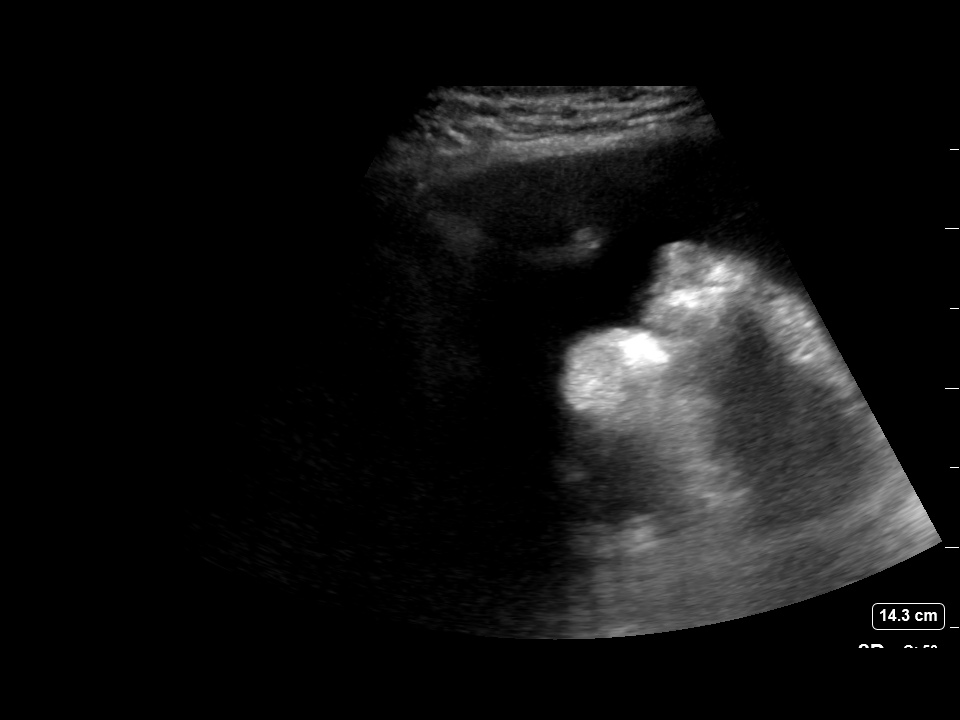
[im 2/3]
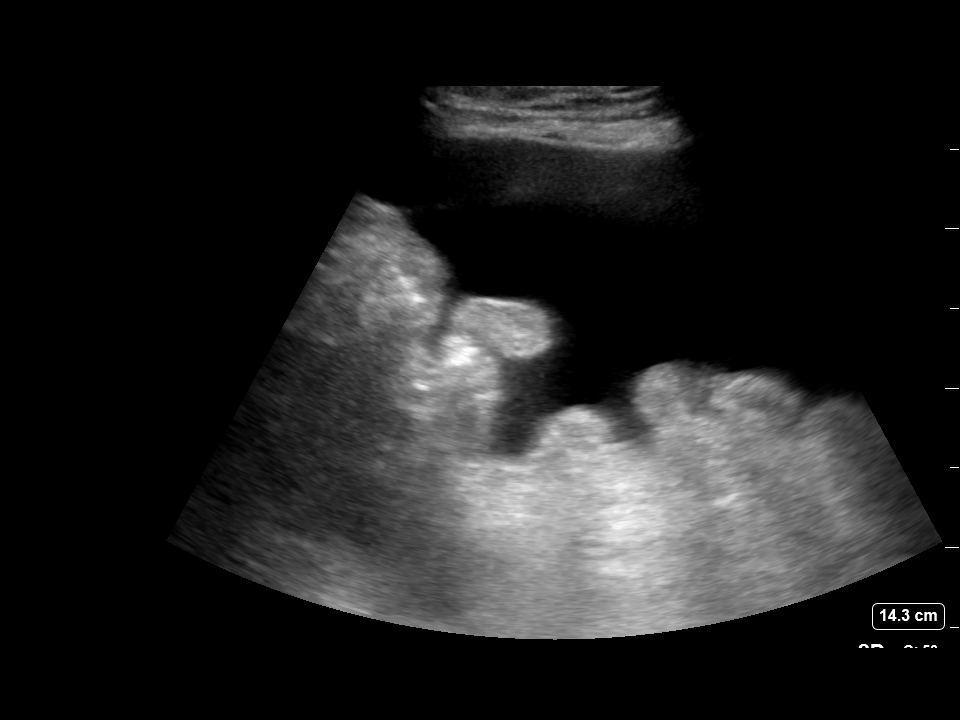
[im 3/3]
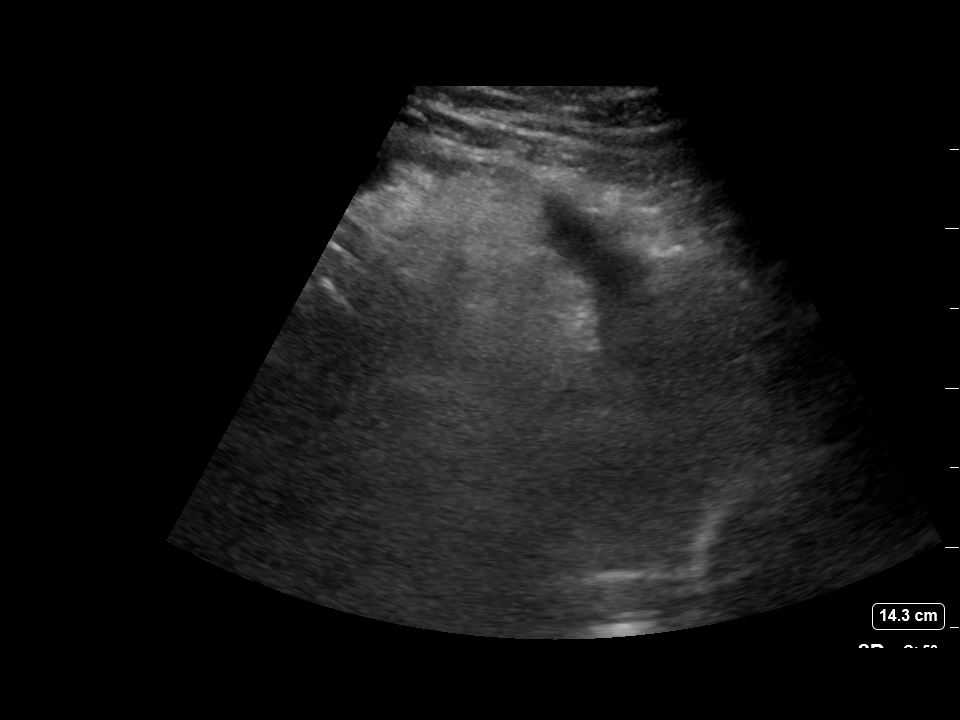

[3 of 3 positions shown; findings below may reference images not displayed]

EXAM:
ULTRASOUND GUIDED THERAPEUTIC AND DIAGNOSTIC PARACENTESIS

MEDICATIONS:
Lidocaine 1% 10 mL

COMPLICATIONS:
None immediate.

PROCEDURE:
Informed written consent was obtained from the patient after a
discussion of the risks, benefits and alternatives to treatment. A
timeout was performed prior to the initiation of the procedure.

Initial ultrasound scanning demonstrates a moderate amount of
ascites within the right lower abdominal quadrant. The right lower
abdomen was prepped and draped in the usual sterile fashion. 1%
lidocaine was used for local anesthesia.

Following this, a 6 Fr Safe-T-Centesis catheter was introduced. An
ultrasound image was saved for documentation purposes. The
paracentesis was performed. The catheter was removed and a dressing
was applied. The patient tolerated the procedure well without
immediate post procedural complication.
FINDINGS: A total of approximately 3.7 L of straw-colored fluid was removed.
Samples were sent to the laboratory as requested by the clinical
team.
IMPRESSION: Successful ultrasound-guided therapeutic and diagnostic paracentesis
yielding 3.7 liters of peritoneal fluid.

Read by: AMITANI, NP

## 2019-08-17 IMAGING — US US ABDOMEN LIMITED
1 series · 14 of 25 positions shown · non-contrast
Comparison: CT [DATE].

CLINICAL DATA: Ascites, post paracentesis.  Cirrhosis.

EXAM:
ULTRASOUND ABDOMEN LIMITED RIGHT UPPER QUADRANT

[Series 1: us abdomen limited ruq · 14 of 45 slices shown]
[im 1/45]
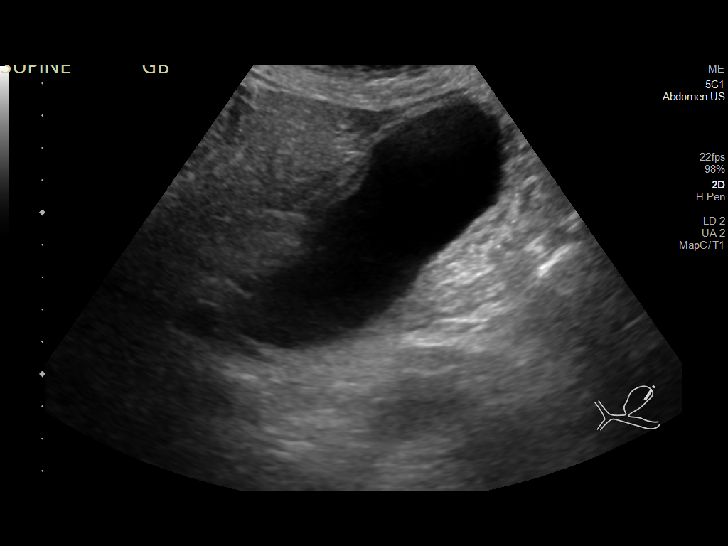
[im 4/45]
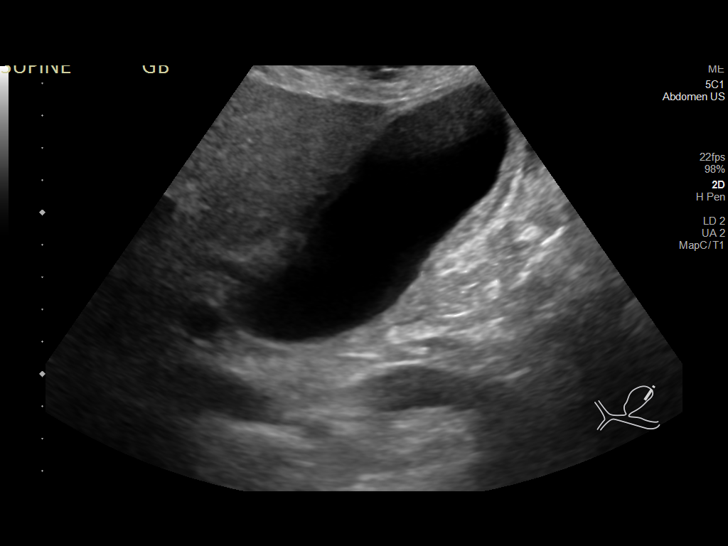
[im 8/45]
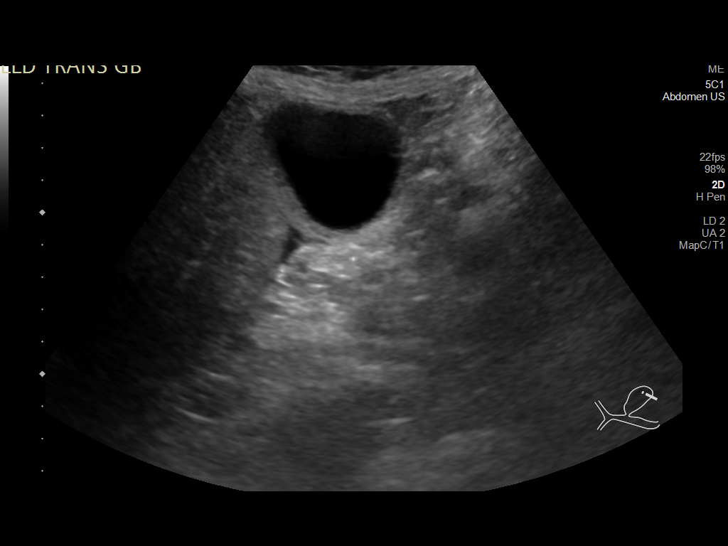
[im 12/45]
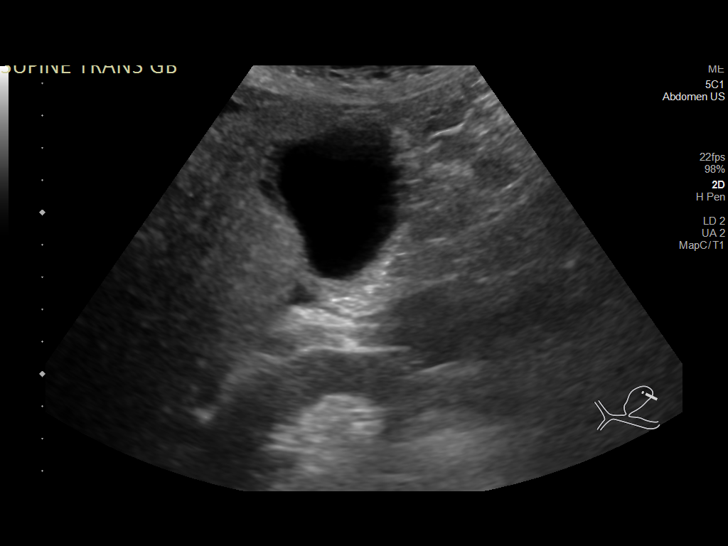
[im 15/45]
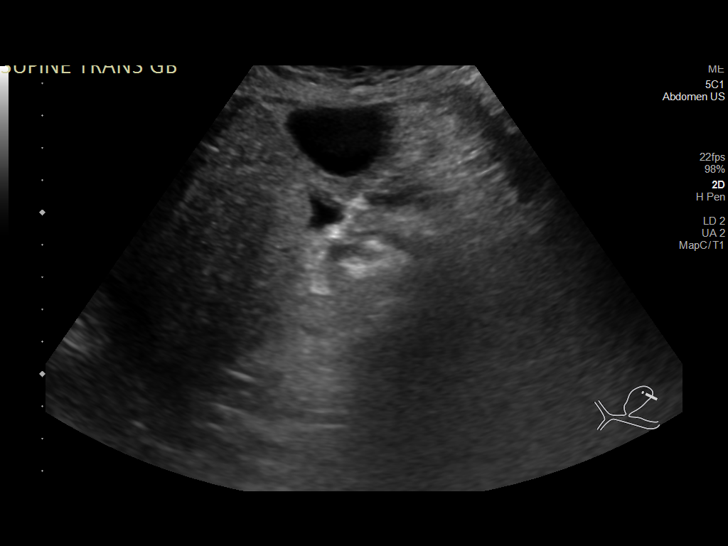
[im 17/45]
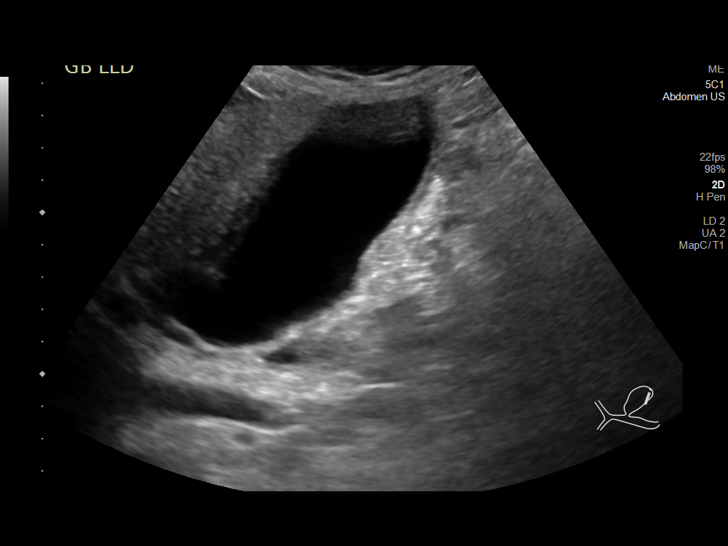
[im 21/45]
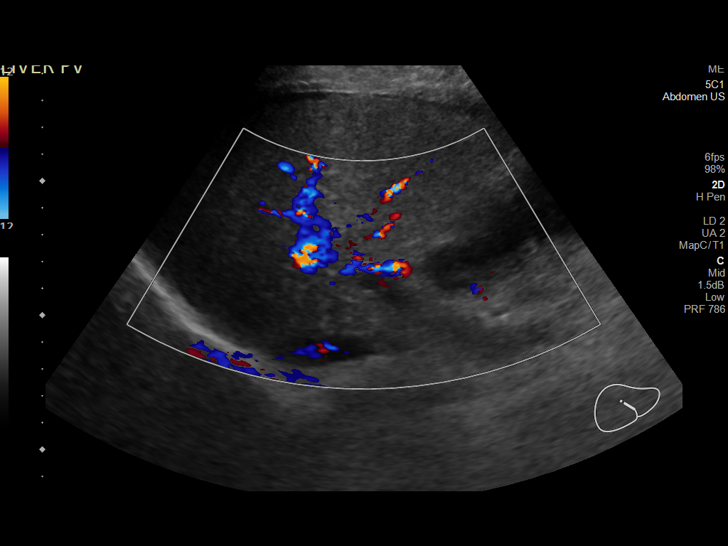
[im 24/45]
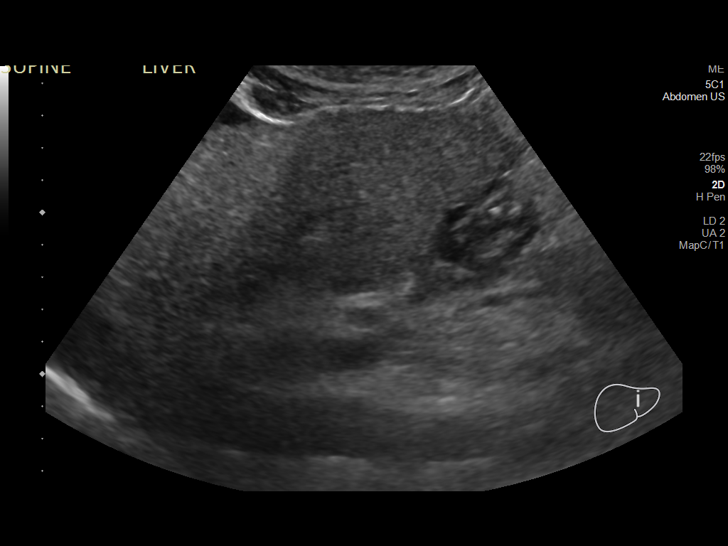
[im 28/45]
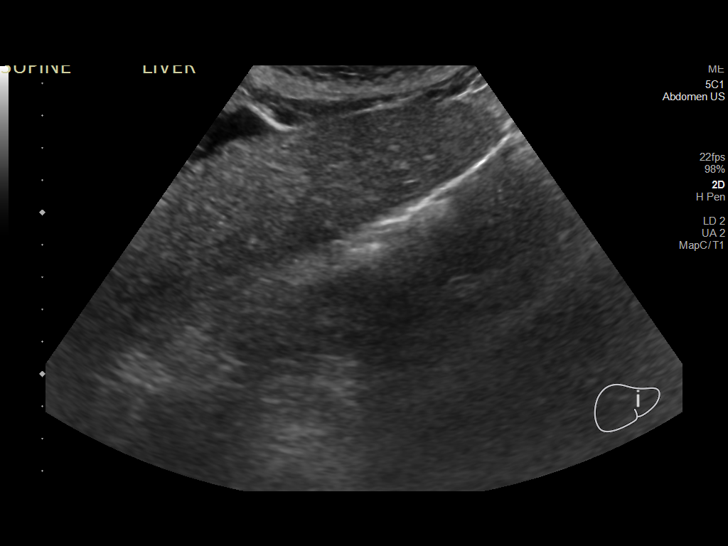
[im 30/45]
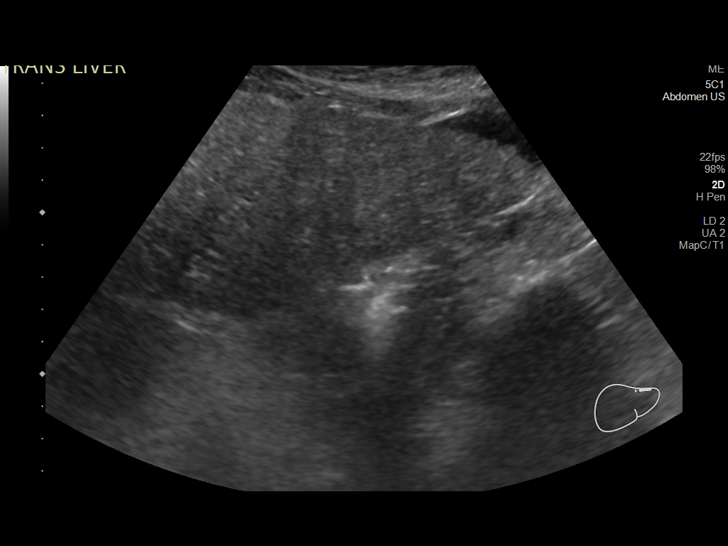
[im 34/45]
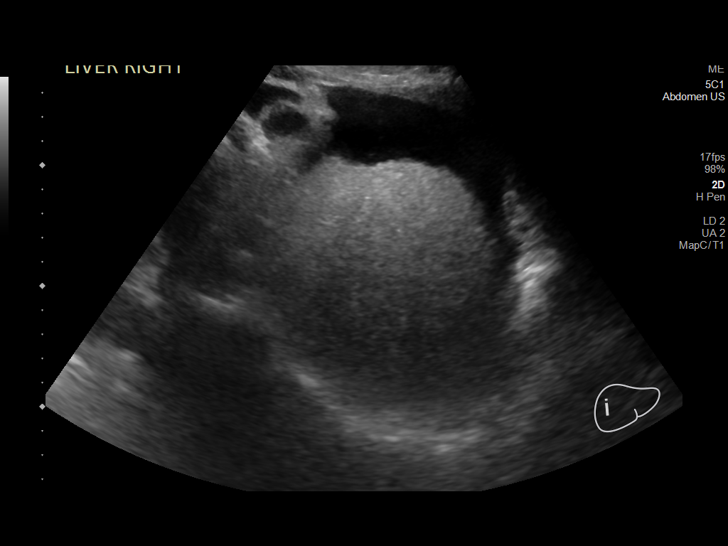
[im 37/45]
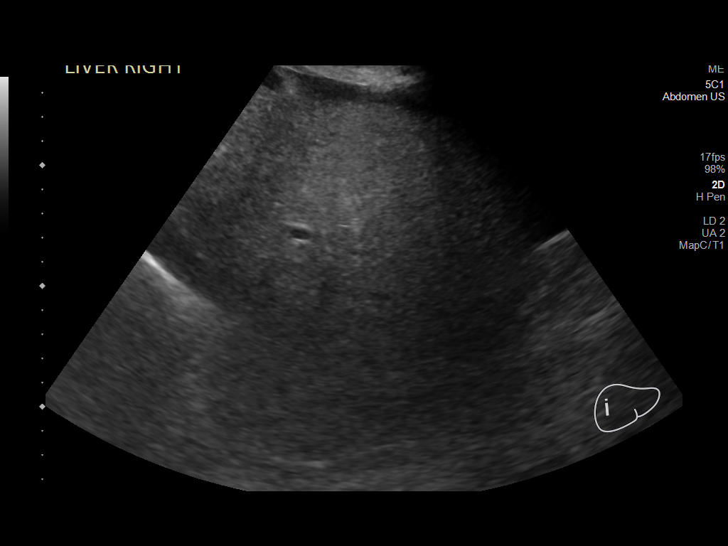
[im 41/45]
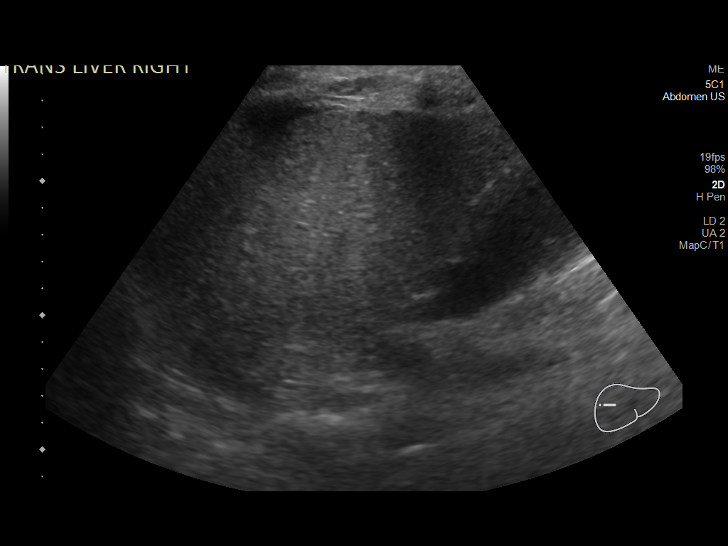
[im 45/45]
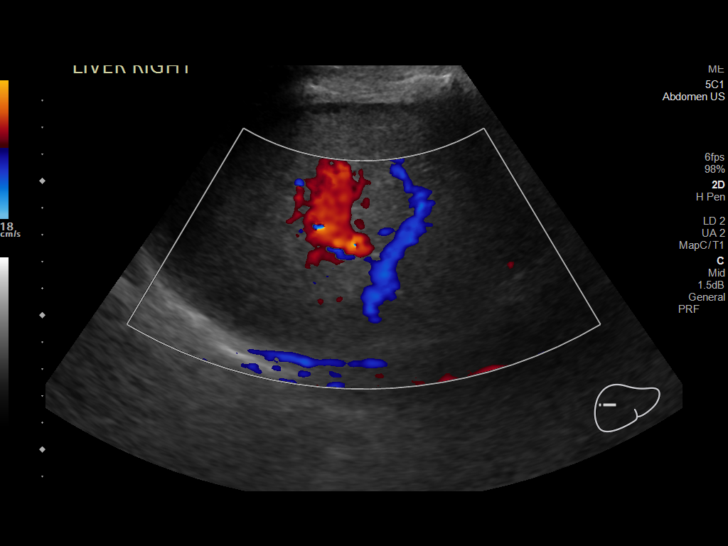

[14 of 25 positions shown; findings below may reference images not displayed]

FINDINGS: Gallbladder:

Gallbladder wall is thickened at 4.3 mm. This may be secondary to
hypoproteinemia. Cholecystitis cannot be excluded. No gallstones.

Common bile duct:

Diameter: 4.9 mm

Liver:

Heterogeneous hepatic echotexture with slightly nodular contour
consistent patient's known cirrhosis. Portal vein appears
thrombosed. No color flow noted.

Other: Minimal residual ascites.
IMPRESSION: 1. Gallbladder wall thickened at 4.3 mm. This may be secondary to
hypoproteinemia. Cholecystitis cannot be excluded. No gallstones
noted. No biliary distention.

2. Heterogeneous hepatic echotexture with slightly irregular contour
consistent patient's known cirrhosis

3.  Portal vein thrombosis.

## 2019-08-17 MED ORDER — VITAMIN K1 10 MG/ML IJ SOLN
10.0000 mg | Freq: Every day | INTRAMUSCULAR | Status: AC
  Administered 2019-08-17 – 2019-08-19 (×2): 10 mg via SUBCUTANEOUS
  Filled 2019-08-17 (×3): qty 1

## 2019-08-17 MED ORDER — LACTULOSE 10 GM/15ML PO SOLN: 30.0000 g | Freq: Two times a day (BID) | ORAL | Status: DC | PRN

## 2019-08-17 MED ORDER — FUROSEMIDE 10 MG/ML IJ SOLN
40.0000 mg | Freq: Two times a day (BID) | INTRAMUSCULAR | Status: DC
  Administered 2019-08-18 (×2): 40 mg via INTRAVENOUS
  Filled 2019-08-17 (×2): qty 4

## 2019-08-17 MED ORDER — OXYCODONE HCL 5 MG PO TABS
5.0000 mg | ORAL_TABLET | Freq: Once | ORAL | Status: AC
  Administered 2019-08-17: 5 mg via ORAL
  Filled 2019-08-17: qty 1

## 2019-08-17 MED ORDER — OXYCODONE HCL 5 MG PO TABS
5.0000 mg | ORAL_TABLET | Freq: Four times a day (QID) | ORAL | Status: AC | PRN
  Administered 2019-08-17 – 2019-08-18 (×2): 5 mg via ORAL
  Filled 2019-08-17 (×2): qty 1

## 2019-08-17 MED ORDER — ONDANSETRON HCL 4 MG/2ML IJ SOLN
4.0000 mg | Freq: Three times a day (TID) | INTRAMUSCULAR | Status: AC | PRN
  Administered 2019-08-18: 4 mg via INTRAVENOUS
  Filled 2019-08-17: qty 2

## 2019-08-17 MED ORDER — RIFAXIMIN 550 MG PO TABS
550.0000 mg | ORAL_TABLET | Freq: Two times a day (BID) | ORAL | Status: DC
  Administered 2019-08-17 – 2019-08-23 (×12): 550 mg via ORAL
  Filled 2019-08-17 (×12): qty 1

## 2019-08-17 MED ORDER — LIDOCAINE HCL (PF) 1 % IJ SOLN
INTRAMUSCULAR | Status: DC | PRN
  Administered 2019-08-17: 10 mL

## 2019-08-17 MED ORDER — GADOBUTROL 1 MMOL/ML IV SOLN
7.5000 mL | Freq: Once | INTRAVENOUS | Status: AC | PRN
  Administered 2019-08-17: 7.5 mL via INTRAVENOUS

## 2019-08-17 MED ORDER — LACTULOSE 10 GM/15ML PO SOLN
30.0000 g | Freq: Two times a day (BID) | ORAL | Status: DC
  Administered 2019-08-17 – 2019-08-19 (×5): 30 g via ORAL
  Filled 2019-08-17 (×5): qty 45

## 2019-08-17 MED ORDER — LIDOCAINE HCL 1 % IJ SOLN
INTRAMUSCULAR | Status: AC
  Filled 2019-08-17: qty 20

## 2019-08-17 NOTE — Consult Note (Signed)
Consultation  Referring Provider: Dr. Rebeca Alert     Primary Care Physician:  Patient, No Pcp Per Primary Gastroenterologist: Althia Forts        Reason for Consultation: Cirrhosis/question alcoholic hepatitis             HPI:   Kayla Sandoval is a 49 y.o. female with a past medical history of alcohol abuse, cirrhosis, depression, anxiety, who presented to the ER on 08/16/2019 with a complaint of bilateral lower extremity edema, abdominal distention and fluid leakage from the abdomen.    Today, the patient describes a weight gain of around 20 pounds in the past 2 months, her daughter started noticing some yellowing of her skin approximately a month ago.  She complains of diffuse abdominal pain which is still rated as an 8-9/10 though may be slightly improved since admission.  Also describes that her 3 L paracentesis helped.  Tells me that the first time she heard of cirrhosis when she she was admitted to [facility] in June, but they only gave her a week's worth of Lasix 20 mg daily and she has not been taking any since.  Does describe some nausea and vomiting, worse over the past week or so.    Reports drinking about 8 beers per day which helps her sleep, last drink was yesterday morning.    Denies fever, chills, blood in her stool, change in bowel habits, heartburn or reflux.  GI history: None specifically but was in [facility] ED in June 2021 for the same complaint and discharged with 20 mg of Lasix which she did not take because "it did not help".  Past Medical History:  Diagnosis Date  . Anxiety   . Depression   . Hypertension     Past Surgical History:  Procedure Laterality Date  . chest thoracostomy      Family History  Problem Relation Age of Onset  . Depression Mother   . Hypertension Mother     Social History   Tobacco Use  . Smoking status: Current Every Day Smoker    Packs/day: 0.50    Years: 10.00    Pack years: 5.00    Types: Cigarettes  .  Smokeless tobacco: Never Used  Vaping Use  . Vaping Use: Never used  Substance Use Topics  . Alcohol use: Yes    Alcohol/week: 3.0 standard drinks    Types: 3 Cans of beer per week    Comment: nightly  . Drug use: Not Currently    Types: Marijuana    Prior to Admission medications   Medication Sig Start Date End Date Taking? Authorizing Provider  acetaminophen (TYLENOL) 500 MG tablet Take 1,000 mg by mouth every 6 (six) hours as needed for mild pain.   Yes [provider]  Melatonin 10 MG TABS Take 10 mg by mouth at bedtime as needed (sleep).   Yes [provider]  traZODone (DESYREL) 50 MG tablet Take 1 tablet (50 mg total) by mouth at bedtime as needed for sleep. 02/16/16  Yes Kerrie Buffalo, NP  azithromycin (ZITHROMAX Z-PAK) 250 MG tablet 2 po day one, then 1 daily x 4 days Patient not taking: Reported on 08/16/2019 03/22/18   Veryl Speak, MD  citalopram (CELEXA) 10 MG tablet Take 1 tablet (10 mg total) by mouth daily. Patient not taking: Reported on 08/16/2019 02/17/16   Kerrie Buffalo, NP  cyclobenzaprine (FLEXERIL) 10 MG tablet Take 1 tablet (10 mg total) by mouth 2 (two)  times daily as needed for muscle spasms. Patient not taking: Reported on 08/16/2019 02/21/18   Maudie Flakes, MD  furosemide (LASIX) 20 MG tablet Take 1 tablet (20 mg total) by mouth daily for 7 days. 07/23/19 07/30/19  Noemi Chapel, MD  hydrocortisone 2.5 % lotion Apply topically 2 (two) times daily. Patient not taking: Reported on 08/16/2019 10/07/17   Frederica Kuster, PA-C  hydrOXYzine (ATARAX/VISTARIL) 25 MG tablet Take 1 tablet (25 mg total) by mouth every 6 (six) hours as needed (anxiety/agitation or CIWA < or = 10). Patient not taking: Reported on 08/16/2019 02/16/16   Kerrie Buffalo, NP  ibuprofen (ADVIL) 800 MG tablet Take 1 tablet (800 mg total) by mouth every 6 (six) hours as needed for moderate pain. Patient not taking: Reported on 08/16/2019 12/25/18   Orpah Greek, MD  lidocaine  (LIDODERM) 5 % Place 1 patch onto the skin daily. Remove & Discard patch within 12 hours or as directed by MD Patient not taking: Reported on 08/16/2019 02/21/18   Maudie Flakes, MD  lisinopril (PRINIVIL,ZESTRIL) 10 MG tablet Take 1 tablet (10 mg total) by mouth daily. Patient not taking: Reported on 08/16/2019 10/07/17   Frederica Kuster, PA-C  naproxen (NAPROSYN) 500 MG tablet Take 1 tablet (500 mg total) by mouth 2 (two) times daily. Patient not taking: Reported on 08/16/2019 02/21/18   Maudie Flakes, MD  nicotine (NICODERM CQ - DOSED IN MG/24 HOURS) 14 mg/24hr patch Place 1 patch (14 mg total) onto the skin daily. Patient not taking: Reported on 08/16/2019 02/17/16   Kerrie Buffalo, NP  pseudoephedrine (SUDAFED 12 HOUR) 120 MG 12 hr tablet Take 1 tablet (120 mg total) by mouth 2 (two) times daily. Patient not taking: Reported on 08/16/2019 02/21/18   Maudie Flakes, MD  QUEtiapine (SEROQUEL) 25 MG tablet Take 1 tablet (25 mg total) by mouth at bedtime. Patient not taking: Reported on 08/16/2019 02/16/16   Kerrie Buffalo, NP  Saccharomyces boulardii (PROBIOTIC) 250 MG CAPS Take 1 capsule by mouth daily. Patient not taking: Reported on 08/16/2019 10/07/17   Frederica Kuster, PA-C  traMADol (ULTRAM) 50 MG tablet Take 1 tablet (50 mg total) by mouth every 6 (six) hours as needed. Patient not taking: Reported on 08/16/2019 12/25/18   Orpah Greek, MD    Current Facility-Administered Medications  Medication Dose Route Frequency Provider Last Rate Last Admin  . enoxaparin (LOVENOX) injection 40 mg  40 mg Subcutaneous Q24H Agyei, Obed K, MD      . folic acid (FOLVITE) tablet 1 mg  1 mg Oral Daily Agyei, Obed K, MD   1 mg at 08/17/19 0810  . furosemide (LASIX) injection 40 mg  40 mg Intravenous Daily Jean Rosenthal, MD   40 mg at 08/17/19 0811  . lidocaine (XYLOCAINE) 1 % (with pres) injection           . spironolactone (ALDACTONE) tablet 50 mg  50 mg Oral Daily Jean Rosenthal, MD   50 mg at 08/17/19 0810    . thiamine (B-1) 250 mg in sodium chloride 0.9 % 50 mL IVPB  250 mg Intravenous Daily Jean Rosenthal, MD   Stopped at 08/16/19 1520    Allergies as of 08/16/2019  . (No Known Allergies)     Review of Systems:    Constitutional: No weight loss, fever or chills Skin: No rash  Cardiovascular: No chest pain Respiratory: No SOB Gastrointestinal: See HPI and otherwise negative Genitourinary: No dysuria  Neurological: No headache, dizziness or syncope Musculoskeletal: No new muscle or joint pain Hematologic: No bleeding Psychiatric: No history of depression or anxiety    Physical Exam:  Vital signs in last 24 hours: Temp:  [98.1 F (36.7 C)-99.7 F (37.6 C)] 98.7 F (37.1 C) (07/06 0456) Pulse Rate:  [95-126] 98 (07/06 0456) Resp:  [12-20] 13 (07/06 0456) BP: (111-142)/(77-96) 111/77 (07/06 0456) SpO2:  [91 %-97 %] 93 % (07/06 0456) Weight:  [74.9 kg-77.8 kg] 74.9 kg (07/06 0510) Last BM Date: 08/16/19 General:   Pleasant Caucasian female appears to be in NAD, Well developed, Well nourished, alert and cooperative Head:  Normocephalic and atraumatic. Eyes:   PEERL, EOMI. No icterus. Conjunctiva pink. Ears:  Normal auditory acuity. Neck:  Supple Throat: Oral cavity and pharynx without inflammation, swelling or lesion.  Lungs: Respirations even and unlabored. Lungs clear to auscultation bilaterally.   No wheezes, crackles, or rhonchi.  Heart: Normal S1, S2. No MRG. Regular rate and rhythm. No peripheral edema, cyanosis or pallor.  Abdomen:  Soft, nondistended, Moderate generalized ttp, No rebound or guarding. Normal bowel sounds. No appreciable masses or hepatomegaly. Rectal:  Not performed.  Msk:  Symmetrical without gross deformities. Peripheral pulses intact.  Extremities:  B/l LE edema, no deformity or joint abnormality.  Neurologic:  Alert and  oriented x4;  grossly normal neurologically. Skin:   Dry and intact without significant lesions or rashes. Psychiatric: Demonstrates  good judgement and reason without abnormal affect or behaviors.   LAB RESULTS: Recent Labs    08/16/19 1138 08/17/19 0246  WBC 4.6 5.1  HGB 14.7 12.3  HCT 43.9 35.8*  PLT 138* 124*   BMET Recent Labs    08/16/19 1138 08/17/19 0246  NA 136 135  K 4.1 3.8  CL 102 101  CO2 22 24  GLUCOSE 109* 91  BUN <5* <5*  CREATININE 0.40* 0.37*  CALCIUM 7.8* 7.4*   LFT Recent Labs    08/16/19 1138 08/16/19 1419 08/17/19 0246  PROT   < >  --  5.8*  ALBUMIN   < >  --  2.0*  AST   < >  --  161*  ALT   < >  --  30  ALKPHOS   < >  --  223*  BILITOT   < > 6.5* 5.8*  BILIDIR  --  3.1*  --   IBILI  --  3.4*  --    < > = values in this interval not displayed.   PT/INR Recent Labs    08/16/19 1222  LABPROT 16.8*  INR 1.4*    STUDIES: DG Chest Port 1 View  Result Date: 08/16/2019 CLINICAL DATA:  Shortness of breath with lower extremity edema EXAM: PORTABLE CHEST 1 VIEW COMPARISON:  July 23, 2019 FINDINGS: Trachea midline. Cardiomediastinal contours and hilar structures are normal. Lungs are clear. Mild blunting of LEFT costodiaphragmatic sulcus. Signs of prior trauma to RIGHT fifth and sixth ribs posteriorly. No dense consolidation. No acute process with respect to visualized skeletal structures on limited assessment. IMPRESSION: Question trace LEFT pleural effusion, no acute finding otherwise in the chest. Electronically Signed   By: Zetta Bills M.D.   On: 08/16/2019 12:32    Impression / Plan:   Impression: 1.  Decompensated cirrhosis: Likely alcoholic given history of alcohol abuse, prior history of cirrhosis and ascites in June but never followed up, CT abdomen pelvis 07/2019 with moderate ascites, cirrhosis and portal hypertension and indeterminate areas of low attenuation within  the liver, possibly small liver masses, Madrey's discriminant score 28.3 2.  Liver mass: Indeterminate areas on recent CT in the liver, MRI recommended 3.  Pleural effusion: On chest x-ray,  questionable left lower lobe effusion 4.  Alcohol abuse  Plan: 1.  Continue monitoring of LFT's 2.  Continue spironolactone 50 mg daily and Lasix 40 mg daily 3. Await MRI results 4. Discussed alcohol cessation 5.  Please await any further recommendations from Dr. Lyndel Safe later today  Thank you for your kind consultation, we will continue to follow.  Lavone Nian [facility] Nickle  08/17/2019, 9:40 AM

## 2019-08-17 NOTE — Consult Note (Addendum)
Consultation  Referring Provider: Dr. Rebeca Alert     Primary Care Physician:  Patient, No Pcp Per Primary Gastroenterologist: Althia Forts        Reason for Consultation: Cirrhosis/question alcoholic hepatitis             HPI:   Kayla Sandoval is a 49 y.o. female with a past medical history of alcohol abuse, cirrhosis, depression, anxiety, who presented to the ER on 08/16/2019 with a complaint of bilateral lower extremity edema, abdominal distention and fluid leakage from the abdomen.    Today, the patient describes a weight gain of around 20 pounds in the past 2 months, her daughter started noticing some yellowing of her skin approximately a month ago.  She complains of diffuse abdominal pain which is still rated as an 8-9/10 though may be slightly improved since admission.  Also describes that her 3 L paracentesis helped.  Tells me that the first time she heard of cirrhosis when she she was admitted to [facility] in June, but they only gave her a week's worth of Lasix 20 mg daily and she has not been taking any since.  Does describe some nausea and vomiting, worse over the past week or so.    Reports drinking about 8 beers per day which helps her sleep, last drink was yesterday morning.    Denies fever, chills, blood in her stool, change in bowel habits, heartburn or reflux.  GI history: None specifically but was in [facility] ED in June 2021 for the same complaint and discharged with 20 mg of Lasix which she did not take because "it did not help".  Past Medical History:  Diagnosis Date  . Anxiety   . Depression   . Hypertension     Past Surgical History:  Procedure Laterality Date  . chest thoracostomy      Family History  Problem Relation Age of Onset  . Depression Mother   . Hypertension Mother     Social History   Tobacco Use  . Smoking status: Current Every Day Smoker    Packs/day: 0.50    Years: 10.00    Pack years: 5.00    Types: Cigarettes  .  Smokeless tobacco: Never Used  Vaping Use  . Vaping Use: Never used  Substance Use Topics  . Alcohol use: Yes    Alcohol/week: 3.0 standard drinks    Types: 3 Cans of beer per week    Comment: nightly  . Drug use: Not Currently    Types: Marijuana    Prior to Admission medications   Medication Sig Start Date End Date Taking? Authorizing Provider  acetaminophen (TYLENOL) 500 MG tablet Take 1,000 mg by mouth every 6 (six) hours as needed for mild pain.   Yes [provider]  Melatonin 10 MG TABS Take 10 mg by mouth at bedtime as needed (sleep).   Yes [provider]  traZODone (DESYREL) 50 MG tablet Take 1 tablet (50 mg total) by mouth at bedtime as needed for sleep. 02/16/16  Yes Kerrie Buffalo, NP  azithromycin (ZITHROMAX Z-PAK) 250 MG tablet 2 po day one, then 1 daily x 4 days Patient not taking: Reported on 08/16/2019 03/22/18   Veryl Speak, MD  citalopram (CELEXA) 10 MG tablet Take 1 tablet (10 mg total) by mouth daily. Patient not taking: Reported on 08/16/2019 02/17/16   Kerrie Buffalo, NP  cyclobenzaprine (FLEXERIL) 10 MG tablet Take 1 tablet (10 mg total) by mouth 2 (two)  times daily as needed for muscle spasms. Patient not taking: Reported on 08/16/2019 02/21/18   Maudie Flakes, MD  furosemide (LASIX) 20 MG tablet Take 1 tablet (20 mg total) by mouth daily for 7 days. 07/23/19 07/30/19  Noemi Chapel, MD  hydrocortisone 2.5 % lotion Apply topically 2 (two) times daily. Patient not taking: Reported on 08/16/2019 10/07/17   Frederica Kuster, PA-C  hydrOXYzine (ATARAX/VISTARIL) 25 MG tablet Take 1 tablet (25 mg total) by mouth every 6 (six) hours as needed (anxiety/agitation or CIWA < or = 10). Patient not taking: Reported on 08/16/2019 02/16/16   Kerrie Buffalo, NP  ibuprofen (ADVIL) 800 MG tablet Take 1 tablet (800 mg total) by mouth every 6 (six) hours as needed for moderate pain. Patient not taking: Reported on 08/16/2019 12/25/18   Orpah Greek, MD  lidocaine  (LIDODERM) 5 % Place 1 patch onto the skin daily. Remove & Discard patch within 12 hours or as directed by MD Patient not taking: Reported on 08/16/2019 02/21/18   Maudie Flakes, MD  lisinopril (PRINIVIL,ZESTRIL) 10 MG tablet Take 1 tablet (10 mg total) by mouth daily. Patient not taking: Reported on 08/16/2019 10/07/17   Frederica Kuster, PA-C  naproxen (NAPROSYN) 500 MG tablet Take 1 tablet (500 mg total) by mouth 2 (two) times daily. Patient not taking: Reported on 08/16/2019 02/21/18   Maudie Flakes, MD  nicotine (NICODERM CQ - DOSED IN MG/24 HOURS) 14 mg/24hr patch Place 1 patch (14 mg total) onto the skin daily. Patient not taking: Reported on 08/16/2019 02/17/16   Kerrie Buffalo, NP  pseudoephedrine (SUDAFED 12 HOUR) 120 MG 12 hr tablet Take 1 tablet (120 mg total) by mouth 2 (two) times daily. Patient not taking: Reported on 08/16/2019 02/21/18   Maudie Flakes, MD  QUEtiapine (SEROQUEL) 25 MG tablet Take 1 tablet (25 mg total) by mouth at bedtime. Patient not taking: Reported on 08/16/2019 02/16/16   Kerrie Buffalo, NP  Saccharomyces boulardii (PROBIOTIC) 250 MG CAPS Take 1 capsule by mouth daily. Patient not taking: Reported on 08/16/2019 10/07/17   Frederica Kuster, PA-C  traMADol (ULTRAM) 50 MG tablet Take 1 tablet (50 mg total) by mouth every 6 (six) hours as needed. Patient not taking: Reported on 08/16/2019 12/25/18   Orpah Greek, MD    Current Facility-Administered Medications  Medication Dose Route Frequency Provider Last Rate Last Admin  . enoxaparin (LOVENOX) injection 40 mg  40 mg Subcutaneous Q24H Agyei, Obed K, MD      . folic acid (FOLVITE) tablet 1 mg  1 mg Oral Daily Agyei, Obed K, MD   1 mg at 08/17/19 0810  . furosemide (LASIX) injection 40 mg  40 mg Intravenous Daily Jean Rosenthal, MD   40 mg at 08/17/19 0811  . lidocaine (XYLOCAINE) 1 % (with pres) injection           . spironolactone (ALDACTONE) tablet 50 mg  50 mg Oral Daily Jean Rosenthal, MD   50 mg at 08/17/19 0810    . thiamine (B-1) 250 mg in sodium chloride 0.9 % 50 mL IVPB  250 mg Intravenous Daily Jean Rosenthal, MD   Stopped at 08/16/19 1520    Allergies as of 08/16/2019  . (No Known Allergies)     Review of Systems:    Constitutional: No weight loss, fever or chills Skin: No rash  Cardiovascular: No chest pain Respiratory: No SOB Gastrointestinal: See HPI and otherwise negative Genitourinary: No dysuria  Neurological: No headache, dizziness or syncope Musculoskeletal: No new muscle or joint pain Hematologic: No bleeding Psychiatric: No history of depression or anxiety    Physical Exam:  Vital signs in last 24 hours: Temp:  [98.1 F (36.7 C)-99.7 F (37.6 C)] 98.7 F (37.1 C) (07/06 0456) Pulse Rate:  [95-126] 98 (07/06 0456) Resp:  [12-20] 13 (07/06 0456) BP: (111-142)/(77-96) 111/77 (07/06 0456) SpO2:  [91 %-97 %] 93 % (07/06 0456) Weight:  [74.9 kg-77.8 kg] 74.9 kg (07/06 0510) Last BM Date: 08/16/19 General:   Pleasant Caucasian female appears to be in NAD, Well developed, Well nourished, alert and cooperative Head:  Normocephalic and atraumatic. Eyes:   PEERL, EOMI. No icterus. Conjunctiva pink. Ears:  Normal auditory acuity. Neck:  Supple Throat: Oral cavity and pharynx without inflammation, swelling or lesion.  Lungs: Respirations even and unlabored. Lungs clear to auscultation bilaterally.   No wheezes, crackles, or rhonchi.  Heart: Normal S1, S2. No MRG. Regular rate and rhythm. No peripheral edema, cyanosis or pallor.  Abdomen:  Soft, nondistended, Moderate generalized ttp, No rebound or guarding. Normal bowel sounds. No appreciable masses or hepatomegaly. Rectal:  Not performed.  Msk:  Symmetrical without gross deformities. Peripheral pulses intact.  Extremities:  B/l LE edema, no deformity or joint abnormality.  Neurologic:  Alert and  oriented x4;  grossly normal neurologically. Skin:   Dry and intact without significant lesions or rashes. Psychiatric: Demonstrates  good judgement and reason without abnormal affect or behaviors.   LAB RESULTS: Recent Labs    08/16/19 1138 08/17/19 0246  WBC 4.6 5.1  HGB 14.7 12.3  HCT 43.9 35.8*  PLT 138* 124*   BMET Recent Labs    08/16/19 1138 08/17/19 0246  NA 136 135  K 4.1 3.8  CL 102 101  CO2 22 24  GLUCOSE 109* 91  BUN <5* <5*  CREATININE 0.40* 0.37*  CALCIUM 7.8* 7.4*   LFT Recent Labs    08/16/19 1138 08/16/19 1419 08/17/19 0246  PROT   < >  --  5.8*  ALBUMIN   < >  --  2.0*  AST   < >  --  161*  ALT   < >  --  30  ALKPHOS   < >  --  223*  BILITOT   < > 6.5* 5.8*  BILIDIR  --  3.1*  --   IBILI  --  3.4*  --    < > = values in this interval not displayed.   PT/INR Recent Labs    08/16/19 1222  LABPROT 16.8*  INR 1.4*    STUDIES: DG Chest Port 1 View  Result Date: 08/16/2019 CLINICAL DATA:  Shortness of breath with lower extremity edema EXAM: PORTABLE CHEST 1 VIEW COMPARISON:  July 23, 2019 FINDINGS: Trachea midline. Cardiomediastinal contours and hilar structures are normal. Lungs are clear. Mild blunting of LEFT costodiaphragmatic sulcus. Signs of prior trauma to RIGHT fifth and sixth ribs posteriorly. No dense consolidation. No acute process with respect to visualized skeletal structures on limited assessment. IMPRESSION: Question trace LEFT pleural effusion, no acute finding otherwise in the chest. Electronically Signed   By: Zetta Bills M.D.   On: 08/16/2019 12:32    Impression / Plan:   Impression: 1.  Decompensated cirrhosis: Likely alcoholic given history of alcohol abuse, prior history of cirrhosis and ascites in June but never followed up, CT abdomen pelvis 07/2019 with moderate ascites, cirrhosis and portal hypertension and indeterminate areas of low attenuation within  the liver, possibly small liver masses, Madrey's discriminant score 28.3 2.  Liver mass: Indeterminate areas on recent CT in the liver, MRI recommended 3.  Pleural effusion: On chest x-ray,  questionable left lower lobe effusion 4.  Alcohol abuse  Plan: 1.  Continue monitoring of LFT's 2.  Continue spironolactone 50 mg daily and Lasix 40 mg daily 3. Await MRI results 4. Discussed alcohol cessation 5.  Please await any further recommendations from Dr. Lyndel Safe later today  Thank you for your kind consultation, we will continue to follow.  Lavone Nian [facility]  08/17/2019, 9:40 AM    Attending physician's note   I have taken an interval history, reviewed the chart and examined the patient. I agree with the Advanced Practitioner's note, impression and recommendations.   Decompensated ETOH liver cirrhosis.  Not a candidate for liver transplant d/t continued alcohol abuse (last drink 7/4 AM). MELD-Na 20. DF <32.  Mod Ascites with generalized anasarca s/p LVP 3.7 lit Jaundice, hypersplenism with thrombocytopenia, coagulopathy, hypoalbuminemia. Abnormal CT showing liver lesions ?Etiology. AFP pending Subclinical hepatic encephalopathy. 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 -Await ascitic fluid studies. -Low-salt normal protein diet. -Spironolactone 50/Lasix 40.  Will increase dose as tolerated. Daily weights. -Strictly no alcohol. -Lactulose 30 cc qd (titrate to 2-3 BMs/day)/rifaximin 550 BID -Vitamin K 10mg  SQ QD x 3 days. -Trend electrolytes, CBC, LFTs. -Ambulate/PT. -Needs GI FU as outpt. -Outpatient EGD to eval varices after FU   Carmell Austria, MD [facility] [phone].

## 2019-08-17 NOTE — Progress Notes (Signed)
Contacted by nurse regarding patient having abdominal pain. Went to evaluate at bedside. Patient reported that she is having diffuse abdominal pain and some back pain. She reports that it's a little better then when she came in. She just underwent the paracentesis with no complications. She denied any new symptoms. She reports that the pain medications helped when she got them.  Vitals are stable. On exam she appears calm, in NAD, laying flat, abdominal exam was soft, tender to palpation diffusely, no guarding or rebound noted, bandage over paracentesis site with no drainage.  Overall this appears to be due to her ascites, did get about 3.7 L off during paracentesis. No acute abdomen on exam. No concern for bowel perforation at this time given improvement from admission. Will treat symptomatically for now. Advised patient to contact nurse if pain worsens or changes in nature.  -Oxycodone 5 mg q6 x2 doses -Zofran PRN

## 2019-08-17 NOTE — Progress Notes (Addendum)
Subjective:   Patient examined at bedside. She reports that pain is improved after paracentesis this morning. She states pain is primarily abdominal and diffuse. SOB improved after paracentesis. She reported poor sleep.   Discussed alcohol cessation and her future prognosis with patient and her family.   Objective:  Vital signs in last 24 hours: Vitals:   08/16/19 1955 08/16/19 2342 08/17/19 0456 08/17/19 0510  BP: 120/81  111/77   Pulse: 95 98 98   Resp: 12 14 13    Temp: 99 F (37.2 C) 99.7 F (37.6 C) 98.7 F (37.1 C)   TempSrc: Oral Oral Oral   SpO2: 92% 93% 93%   Weight:    74.9 kg  Height:        Physical Exam Physical Exam Constitutional:      General: She is not in acute distress.    Appearance: She is not toxic-appearing.  Eyes:     General: Scleral icterus present.     Comments: Scleral icterus improved  Cardiovascular:     Rate and Rhythm: Normal rate and regular rhythm.     Heart sounds: Normal heart sounds.  Pulmonary:     Effort: No respiratory distress.  Abdominal:     General: Bowel sounds are normal. There is distension.     Tenderness: There is abdominal tenderness. There is no guarding.     Comments: Diffuse tenderness to palpation. Abdominal distention improved after paracentesis  Musculoskeletal:     Right lower leg: Edema present.     Left lower leg: Edema present.     Comments: +1 bilateral LE edema  Skin:    General: Skin is warm.  Neurological:     Mental Status: She is alert.  Psychiatric:        Mood and Affect: Mood normal.     Assessment/Plan: Kayla Sandoval is a 49 yo WF with PMH of alcohol abuse, cirrhosis with prior history of cirrhosis, HTN, depression, anxiety, admitted to the hospital for complaints of bilateral lower extremity edema and abdominal distention.   Principal Problem:   Decompensated liver disease ([place]) Active Problems:   Macrocytosis without anemia   MDD (major depressive disorder), recurrent severe,  without psychosis ([place])   Alcohol abuse   Liver mass  Decompensated liver disease - Likely alcoholic cirrhosis given history of alcohol abuse and AST/ALT ratio of 2. Hep C and Hep B negative. Patient has prior history cirrhosis and ascites in June but patient failed to follow up with PCP after discharge.  - CT abd/pel (07/2019) showed moderate ascites, cirrhosis and portal hypertension, and indeterminate areas of low attenuation within the liver, possibly small liver masses.  - RUQ [place] confirms cirrhosis, rules out gallstones and no biliary distention. Portal vein thrombosis detected.  - Continue Spironolactone 50 mg and Lasix 40 mg daily for diuresis. Monitor I/O and daily wt.  - Patient does not need steroid given Maddrey's Discriminant score of 28.3. - MELD score of 16: 6% mortality in the next 3 months - Paracentesis performed today and yielded 3.7L. SAAG score > 1.1 which consistent of ascites secondary to portal hypertension. SBP risk is low because Neu count of 2.  - GI consulted and onboard with the treatment plan  Liver mass - CT abd/pel (07/2019) showed moderate ascites, cirrhosis and portal hypertension, and indeterminate areas of low attenuation within the liver, possibly small liver masses.  - MRI of the abdomen is recommended when patient is stable or outpatient. - AFP pending  Portal vein thrombosis - patient is a poor candidate for anticoagulation - Await GI recommendations   Pleural Effusion - CXR shows questionable LLL effusion - Will reassess after diuresis  Alcohol abuse - Blood Ethanol lv 313 - Add Thiamine and Folate  - CIWA protocol without Ativan in place. CIWA score of 1 overnight  HTN - BP is in normal range - Continue to monitor  Diet: thin fluid IVF: NA CODE: Full DVT: Lovenox  Prior to Admission Living Arrangement: home Anticipated Discharge Location: home Barriers to Discharge: hypervolemia and ascites Dispo: Anticipated discharge in  approximately 2 day(s).   Gaylan Gerold, DO 08/17/2019, 7:15 AM Pager: [phone] After 5pm on weekdays and 1pm on weekends: On Call pager [phone]

## 2019-08-17 NOTE — Procedures (Signed)
Ultrasound-guided diagnostic and therapeutic paracentesis performed yielding 3.7 liters of straw colored fluid.  Fluid was sent to lab for analysis. No immediate complications. EBL is none.  

## 2019-08-18 LAB — CBC
HCT: 38.2 % (ref 36.0–46.0)
Hemoglobin: 13 g/dL (ref 12.0–15.0)
MCH: 37.2 pg — ABNORMAL HIGH (ref 26.0–34.0)
MCHC: 34 g/dL (ref 30.0–36.0)
MCV: 109.5 fL — ABNORMAL HIGH (ref 80.0–100.0)
Platelets: 89 10*3/uL — ABNORMAL LOW (ref 150–400)
RBC: 3.49 MIL/uL — ABNORMAL LOW (ref 3.87–5.11)
RDW: 13.7 % (ref 11.5–15.5)
WBC: 4.6 10*3/uL (ref 4.0–10.5)
nRBC: 0 % (ref 0.0–0.2)

## 2019-08-18 LAB — BASIC METABOLIC PANEL
Anion gap: 11 (ref 5–15)
BUN: 6 mg/dL (ref 6–20)
CO2: 26 mmol/L (ref 22–32)
Calcium: 7.8 mg/dL — ABNORMAL LOW (ref 8.9–10.3)
Chloride: 97 mmol/L — ABNORMAL LOW (ref 98–111)
Creatinine, Ser: 0.45 mg/dL (ref 0.44–1.00)
GFR calc Af Amer: >60 mL/min (ref >60)
GFR calc non Af Amer: >60 mL/min (ref >60)
Glucose, Bld: 90 mg/dL (ref 70–99)
Potassium: 3.5 mmol/L (ref 3.5–5.1)
Sodium: 134 mmol/L — ABNORMAL LOW (ref 135–145)

## 2019-08-18 LAB — AFP TUMOR MARKER: AFP, Serum, Tumor Marker: 3.4 ng/mL (ref 0.0–8.3)

## 2019-08-18 LAB — HEPATITIS B SURFACE ANTIBODY, QUANTITATIVE: Hep B S AB Quant (Post): <3.1 m[IU]/mL — ABNORMAL LOW (ref >9.9)

## 2019-08-18 LAB — PROTIME-INR
INR: 1.5 — ABNORMAL HIGH (ref 0.8–1.2)
Prothrombin Time: 17.6 seconds — ABNORMAL HIGH (ref 11.4–15.2)

## 2019-08-18 LAB — HEPATIC FUNCTION PANEL
ALT: 31 U/L (ref 0–44)
AST: 154 U/L — ABNORMAL HIGH (ref 15–41)
Albumin: 2.2 g/dL — ABNORMAL LOW (ref 3.5–5.0)
Alkaline Phosphatase: 253 U/L — ABNORMAL HIGH (ref 38–126)
Bilirubin, Direct: 5.2 mg/dL — ABNORMAL HIGH (ref 0.0–0.2)
Indirect Bilirubin: 5.1 mg/dL — ABNORMAL HIGH (ref 0.3–0.9)
Total Bilirubin: 10.3 mg/dL — ABNORMAL HIGH (ref 0.3–1.2)
Total Protein: 6 g/dL — ABNORMAL LOW (ref 6.5–8.1)

## 2019-08-18 LAB — AMMONIA: Ammonia: 89 umol/L — ABNORMAL HIGH (ref 9–35)

## 2019-08-18 MED ORDER — RAMELTEON 8 MG PO TABS: 8.0000 mg | ORAL_TABLET | Freq: Every day | ORAL | Status: DC

## 2019-08-18 MED ORDER — SPIRONOLACTONE 25 MG PO TABS
100.0000 mg | ORAL_TABLET | Freq: Every day | ORAL | Status: DC
  Administered 2019-08-19: 100 mg via ORAL
  Filled 2019-08-18: qty 4

## 2019-08-18 MED ORDER — OXYCODONE HCL 5 MG PO TABS
5.0000 mg | ORAL_TABLET | Freq: Three times a day (TID) | ORAL | Status: DC | PRN
  Administered 2019-08-18 – 2019-08-23 (×15): 5 mg via ORAL
  Filled 2019-08-18 (×15): qty 1

## 2019-08-18 NOTE — Evaluation (Signed)
Physical Therapy Evaluation & Discharge Patient Details Name: Kayla Sandoval MRN: [id number] DOB: [birth year] Today's Date: 08/18/2019   History of Present Illness  Pt is a 49 y.o. female admitted 08/16/19 with worsening BLE edema and abdominal distension. Workup for decompensated liver disease, likely alcoholic cirrhosis. Abdominal MRI shows focal area within R hepatic lobe suspicious for hepatic neoplasm; imaging limited due to ascites. S/p paracentesis 7/6. PMH includes HTN, depression, anxiety.    Clinical Impression  Patient evaluated by Physical Therapy with no further acute PT needs identified. PTA, pt independent and lives with mother, children live in same apartment complex and help with transportation. Today, pt independent with mobility and ADL tasks. Ambulation distance limited by abdominal pain and SOB; SpO2 92% on RA post-ambulation, HR 119-132. Pt educated on importance of mobility and frequent hallway ambulation encouraged (at least 3x/day). Acute PT is signing off. Thank you for this referral.    Follow Up Recommendations No PT follow up    Equipment Recommendations  None recommended by PT    Recommendations for Other Services       Precautions / Restrictions Precautions Precautions: Other (comment) Precaution Comments: Abdominal for comfort Restrictions Weight Bearing Restrictions: No      Mobility  Bed Mobility Overal bed mobility: Modified Independent             General bed mobility comments: HOB elevated  Transfers Overall transfer level: Independent Equipment used: None             General transfer comment: Indep to stand from bed and low toilet height  Ambulation/Gait Ambulation/Gait assistance: Independent Gait Distance (Feet): 460 Feet Assistive device: None Gait Pattern/deviations: Step-through pattern;Decreased stride length;Wide base of support   Gait velocity interpretation: 1.31 - 2.62 ft/sec, indicative of limited community  ambulator General Gait Details: Slow, steady gait throughout room and in hallway, limited by c/o abodminal pain and SOB. SpO2 92% on RA, HR 119-132  Stairs Stairs:  (Pt declined stair training; reports feeling comfortable ascending steps at home with rail support; "I just take my time")          Wheelchair Mobility    Modified Rankin (Stroke Patients Only)       Balance Overall balance assessment: No apparent balance deficits (not formally assessed)                                           Pertinent Vitals/Pain Pain Assessment: Faces Faces Pain Scale: Hurts little more Pain Location: Abdomen Pain Descriptors / Indicators: Guarding;Grimacing Pain Intervention(s): Monitored during session    Home Living Family/patient expects to be discharged to:: Private residence Living Arrangements: Parent Available Help at Discharge: Family;Available PRN/intermittently Type of Home: Apartment Home Access: Level entry     Home Layout: Two level   Additional Comments: Lives with 43 y.o. mother. Son and daughter live in same apartment complex    Prior Function Level of Independence: Independent         Comments: Does not work. Has not driven since getting sick; daughter lives next door and drives, helps get groceries     Hand Dominance        Extremity/Trunk Assessment   Upper Extremity Assessment Upper Extremity Assessment: Overall WFL for tasks assessed    Lower Extremity Assessment Lower Extremity Assessment: Overall WFL for tasks assessed       Communication   Communication:  No difficulties  Cognition Arousal/Alertness: Awake/alert Behavior During Therapy: WFL for tasks assessed/performed Overall Cognitive Status: Within Functional Limits for tasks assessed                                        General Comments      Exercises     Assessment/Plan    PT Assessment Patent does not need any further PT services  PT  Problem List         PT Treatment Interventions      PT Goals (Current goals can be found in the Care Plan section)  Acute Rehab PT Goals PT Goal Formulation: All assessment and education complete, DC therapy    Frequency     Barriers to discharge        Co-evaluation               AM-PAC PT "6 Clicks" Mobility  Outcome Measure Help needed turning from your back to your side while in a flat bed without using bedrails?: None Help needed moving from lying on your back to sitting on the side of a flat bed without using bedrails?: None Help needed moving to and from a bed to a chair (including a wheelchair)?: None Help needed standing up from a chair using your arms (e.g., wheelchair or bedside chair)?: None Help needed to walk in hospital room?: None Help needed climbing 3-5 steps with a railing? : None 6 Click Score: 24    End of Session   Activity Tolerance: Patient tolerated treatment well Patient left: in bed;with call bell/phone within reach Nurse Communication: Mobility status PT Visit Diagnosis: Other abnormalities of gait and mobility (R26.89);Pain    Time: 1329-1340 PT Time Calculation (min) (ACUTE ONLY): 11 min   Charges:   PT Evaluation $PT Eval Low Complexity: [place], PT, DPT Acute Rehabilitation Services  Pager [phone] Office [phone]  Derry Lory 08/18/2019, 2:04 PM

## 2019-08-18 NOTE — Progress Notes (Signed)
Ordered graduated compression stockings for patient. Length 41.5 cm x 35.5 cm calf x 23.5 cm ankle. Patient educated and aware of instructions for wearing. Pt resting with call bell within reach.  Will continue to monitor.'

## 2019-08-18 NOTE — Progress Notes (Addendum)
Subjective:   Patient examined at bedside.Patient is alert and does not appear encephalopathic. Patient reports diffused abdominal pain. She also complains of hiccups that exacerbate her abdominal pain.  We discussed her MRI results with plan for additional imaging. She voices understanding of plan.    Objective:  Vital signs in last 24 hours: Vitals:   08/17/19 2321 08/18/19 0057 08/18/19 0414 08/18/19 0721  BP: 131/84 120/81 134/85 (!) 118/91  Pulse: 100 98  100  Resp: 13 14 20 17   Temp: 98.6 F (37 C) 98.7 F (37.1 C) 98.8 F (37.1 C) 98.7 F (37.1 C)  TempSrc: Oral Oral Oral Oral  SpO2: 96% 96% 98% 96%  Weight:   70.5 kg   Height:        Physical Exam  Physical Exam HENT:     Head: Atraumatic.  Eyes:     General: Scleral icterus present.  Cardiovascular:     Rate and Rhythm: Normal rate and regular rhythm.     Heart sounds: Normal heart sounds.  Pulmonary:     Effort: No respiratory distress.  Abdominal:     General: Bowel sounds are normal. There is distension.     Tenderness: There is abdominal tenderness.     Comments: No asterixis  Musculoskeletal:     Right lower leg: Edema present.     Left lower leg: Edema present.     Comments: +2 LE edema bilaterally  Skin:    General: Skin is warm.     Coloration: Skin is jaundiced.  Neurological:     Mental Status: She is alert.     Assessment/Plan: Kayla Sandoval is a 49 yo WF with PMH of alcohol abuse, cirrhosis with prior history of cirrhosis, HTN, depression, anxiety, admitted to the hospital for complaints of bilateral lower extremity edema and abdominal distention.   Principal Problem:   Decompensated liver disease ([place]) Active Problems:   Macrocytosis without anemia   MDD (major depressive disorder), recurrent severe, without psychosis ([place])   Alcohol abuse   Liver mass  Decompensated cirrhosis - Likely alcoholic cirrhosis given history of alcohol abuse and AST/ALT ratio of 2. Hep C and Hep  B negative. Patient has prior history cirrhosis and ascites in June but patient failed to follow up with PCP after discharge.  - CT abd/pel (07/2019) showed moderate ascites, cirrhosis and portal hypertension, and indeterminate areas of low attenuation within the liver, possibly small liver masses. RUQ [place] confirms cirrhosis, rules out gallstones and no biliary distention. Portal vein thrombosis detected.  - Patient does not need steroid given Maddrey's Discriminant score of 28.3.  MELD score of 16 (6% mortality in the next 3 months). She may not be a candidate for liver transplant due to ongoing alcohol abuse per GI.  -  Paracentesis consistent with ascites secondary to portal hypertension SAAG score > 1.1.   - Total bil elevated from 5.8 to 10.3. INR also increased to 1.5. Ammonia level 89. Patient is alert and does not appear encephalopathic. Asterixis not present. Added Lactulose/Rifaximin and Vitamin K SQ per GI recommendations.  - Wt drop 165 - 155. 300 cc urine output recorded in the last 24h. Continue Spironolactone 50 mg. Increase Lasix 40 mg to BID given her worsened pitting edema. Added compression stocking and encourage ambulation.  - Continue monitor I/O and daily wt.  - 1.2L fluid restriction. Low salt diet - Oxycodone 5 mg TID PRN for pain.  - Discussed alcohol cessation - Patient will need outpatient  EGD for varices evaluation    Liver mass - CT abd/pel (07/2019) showed moderate ascites, cirrhosis and portal hypertension, and indeterminate areas of low attenuation within the liver, possibly small liver masses.  - Follow up MRI of the abdomen shows focal area within the posterior RIGHT hepatic lobe suspicious for hepatic neoplasm. Imaging was limited due to ascites. Recommended short interval follow up with a CT. Await GI recommendation.  - AFP pending    Portal vein thrombosis - MRI abdomen shows minimal thrombus potentially in the LEFT portal vein - Patient is a poor candidate for  anticoagulation - Await GI recommendations    Pleural Effusion - CXR shows questionable LLL effusion - Continue to monitor I/O, daily wt - Follow 1.2L fluid restriction   Alcohol abuse - Blood Ethanol lv 313 on admission. She reports drinking 8 beers a day.  - Add Thiamine and Folate  - CIWA protocol without Ativan in place.    HTN - BP is in normal range - Continue to monitor   Diet: thin fluid IVF: NA CODE: Full DVT: Lovenox   Prior to Admission Living Arrangement: home Anticipated Discharge Location: home Barriers to Discharge: hypervolemia and ascites Dispo: Anticipated discharge in approximately 2 day(s).   Kayla Gerold, DO 08/18/2019, 8:34 AM Pager: [phone] After 5pm on weekdays and 1pm on weekends: On Call pager [phone]

## 2019-08-19 ENCOUNTER — Inpatient Hospital Stay (HOSPITAL_COMMUNITY): Payer: Self-pay

## 2019-08-19 LAB — BASIC METABOLIC PANEL
Anion gap: 8 (ref 5–15)
Anion gap: 7 (ref 5–15)
BUN: <5 mg/dL — ABNORMAL LOW (ref 6–20)
BUN: 5 mg/dL — ABNORMAL LOW (ref 6–20)
CO2: 31 mmol/L (ref 22–32)
CO2: 27 mmol/L (ref 22–32)
Calcium: 7.5 mg/dL — ABNORMAL LOW (ref 8.9–10.3)
Calcium: 7.4 mg/dL — ABNORMAL LOW (ref 8.9–10.3)
Chloride: 93 mmol/L — ABNORMAL LOW (ref 98–111)
Chloride: 98 mmol/L (ref 98–111)
Creatinine, Ser: 0.55 mg/dL (ref 0.44–1.00)
Creatinine, Ser: 0.48 mg/dL (ref 0.44–1.00)
GFR calc Af Amer: >60 mL/min (ref >60)
GFR calc Af Amer: >60 mL/min (ref >60)
GFR calc non Af Amer: >60 mL/min (ref >60)
GFR calc non Af Amer: >60 mL/min (ref >60)
Glucose, Bld: 118 mg/dL — ABNORMAL HIGH (ref 70–99)
Glucose, Bld: 139 mg/dL — ABNORMAL HIGH (ref 70–99)
Potassium: 2.6 mmol/L — CL (ref 3.5–5.1)
Potassium: 3.6 mmol/L (ref 3.5–5.1)
Sodium: 132 mmol/L — ABNORMAL LOW (ref 135–145)
Sodium: 132 mmol/L — ABNORMAL LOW (ref 135–145)

## 2019-08-19 LAB — PROTIME-INR
INR: 1.5 — ABNORMAL HIGH (ref 0.8–1.2)
Prothrombin Time: 17.9 seconds — ABNORMAL HIGH (ref 11.4–15.2)

## 2019-08-19 LAB — HEPATIC FUNCTION PANEL
ALT: 30 U/L (ref 0–44)
AST: 139 U/L — ABNORMAL HIGH (ref 15–41)
Albumin: 2.1 g/dL — ABNORMAL LOW (ref 3.5–5.0)
Alkaline Phosphatase: 234 U/L — ABNORMAL HIGH (ref 38–126)
Bilirubin, Direct: 4.1 mg/dL — ABNORMAL HIGH (ref 0.0–0.2)
Indirect Bilirubin: 3.5 mg/dL — ABNORMAL HIGH (ref 0.3–0.9)
Total Bilirubin: 7.6 mg/dL — ABNORMAL HIGH (ref 0.3–1.2)
Total Protein: 6.1 g/dL — ABNORMAL LOW (ref 6.5–8.1)

## 2019-08-19 LAB — MAGNESIUM
Magnesium: 1.2 mg/dL — ABNORMAL LOW (ref 1.7–2.4)
Magnesium: 1.7 mg/dL (ref 1.7–2.4)

## 2019-08-19 LAB — CBC
HCT: 38 % (ref 36.0–46.0)
Hemoglobin: 12.8 g/dL (ref 12.0–15.0)
MCH: 36.9 pg — ABNORMAL HIGH (ref 26.0–34.0)
MCHC: 33.7 g/dL (ref 30.0–36.0)
MCV: 109.5 fL — ABNORMAL HIGH (ref 80.0–100.0)
Platelets: 94 10*3/uL — ABNORMAL LOW (ref 150–400)
RBC: 3.47 MIL/uL — ABNORMAL LOW (ref 3.87–5.11)
RDW: 13.4 % (ref 11.5–15.5)
WBC: 4.7 10*3/uL (ref 4.0–10.5)
nRBC: 0 % (ref 0.0–0.2)

## 2019-08-19 LAB — PATHOLOGIST SMEAR REVIEW

## 2019-08-19 IMAGING — CT CT ABDOMEN WO/W CM
4 of 12 series · 12 of 46 positions shown, 17 images · IV contrast (omnipaque)
Comparison: [DATE] MRI abdomen.  [DATE] CT abdomen/pelvis.

CLINICAL DATA: Decompensated cirrhosis. Inpatient. Indeterminate
posterior right liver mass on recent MRI.

EXAM:
CT ABDOMEN WITHOUT AND WITH CONTRAST
TECHNIQUE: Multidetector CT imaging of the abdomen was performed following the
standard protocol before and following the bolus administration of
intravenous contrast.
CONTRAST:  100mL OMNIPAQUE IOHEXOL 350 MG/ML SOLN

[Series 10: venous phase 3.0 i30f 2 · axial · portal-venous · 0.83mm/px · z∈[+1036,+1188]mm · 2 of 155 slices shown, 5 images]
[im 52/155  soft-tissue]
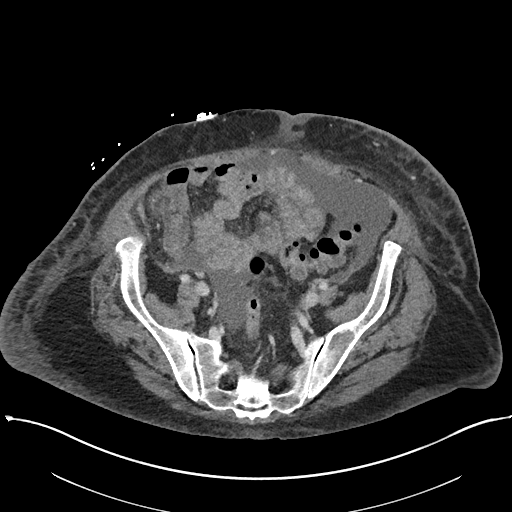
[im 52/155  lung]
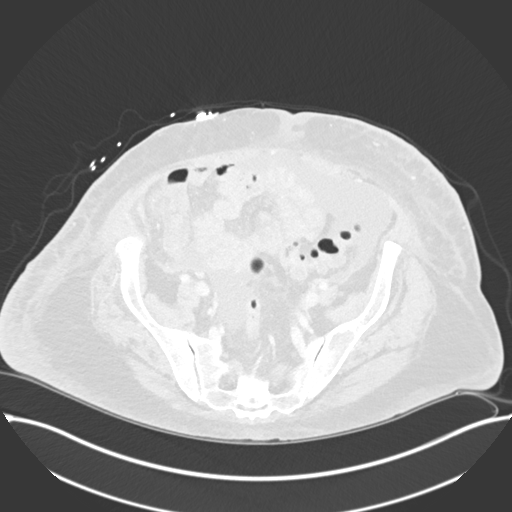
[im 52/155  bone]
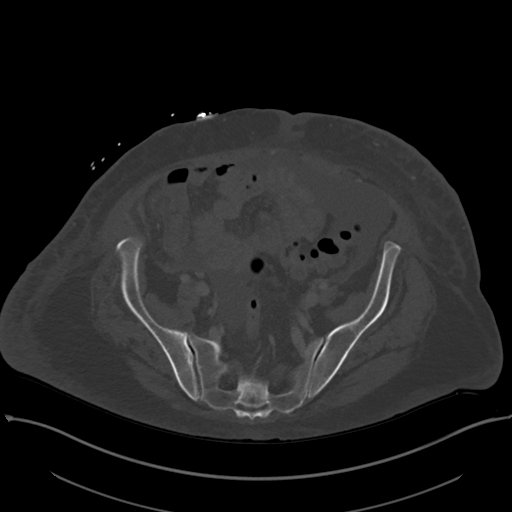
[im 103/155  soft-tissue]
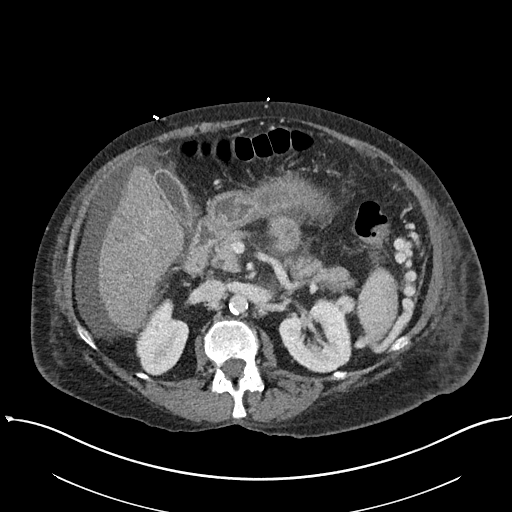
[im 103/155  lung]
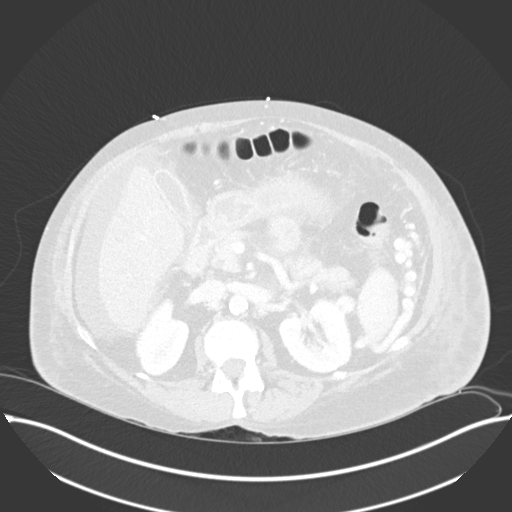

[Series 12: thins · axial · 0.83mm/px · z∈[+917,+1202]mm · 6 of 579 slices shown]
[im 45/579  soft-tissue]
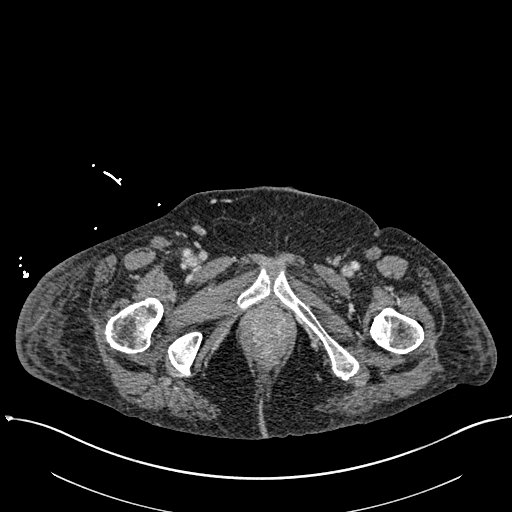
[im 134/579  soft-tissue]
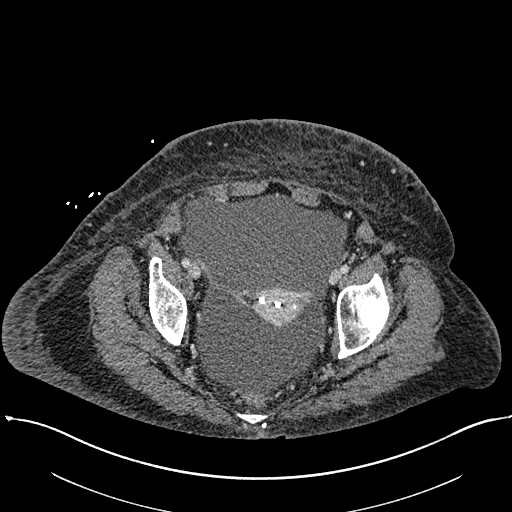
[im 178/579  soft-tissue]
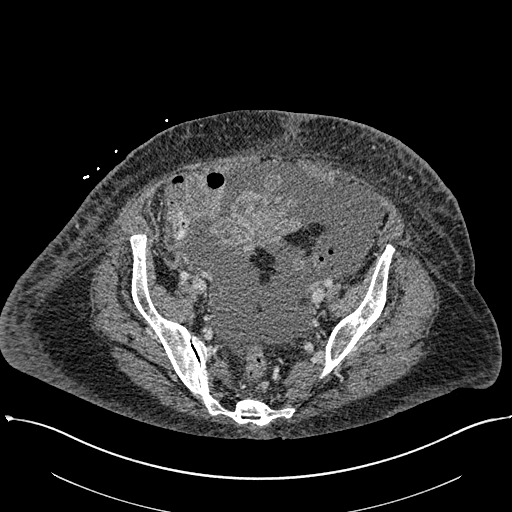
[im 267/579  soft-tissue]
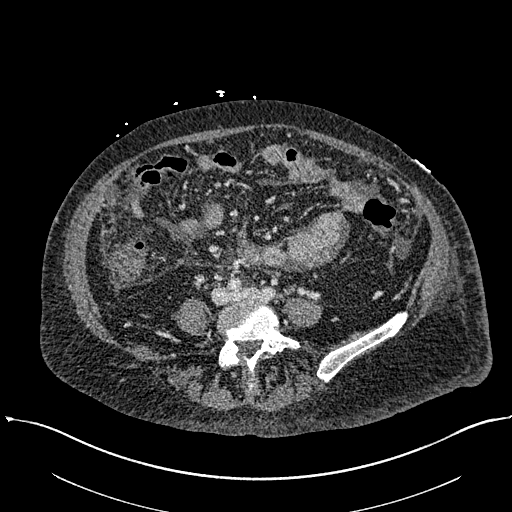
[im 312/579  soft-tissue]
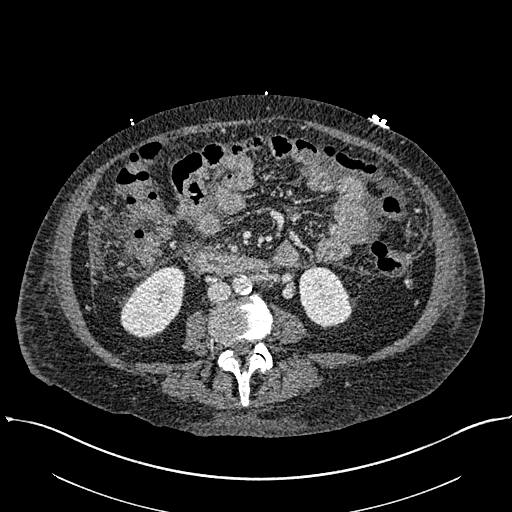
[im 401/579  soft-tissue]
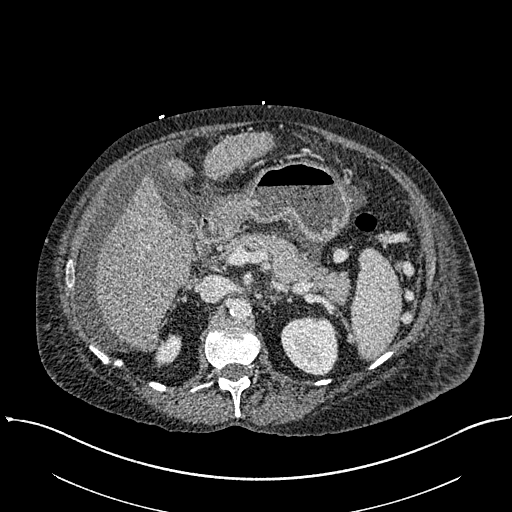

[Series 13: venous coronal · coronal · portal-venous · 0.80mm/px · 3 of 150 slices shown, 4 images]
[im 30/150  soft-tissue]
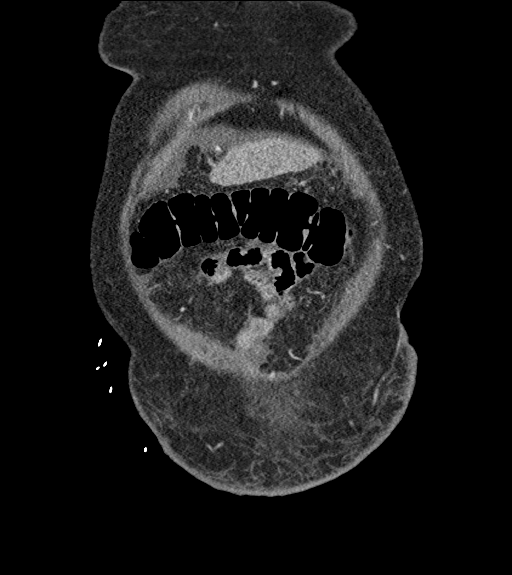
[im 60/150  soft-tissue]
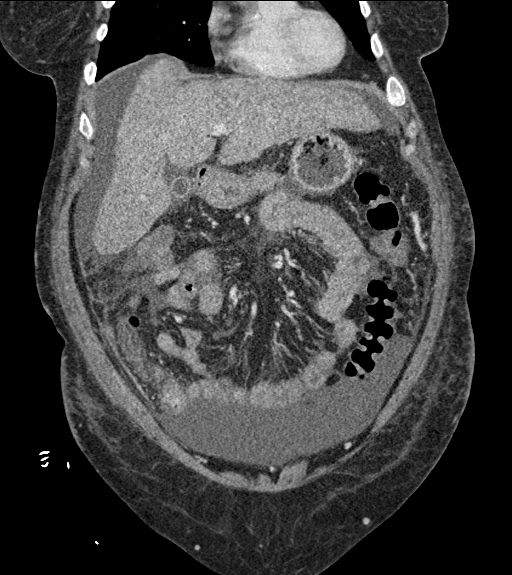
[im 60/150  bone]
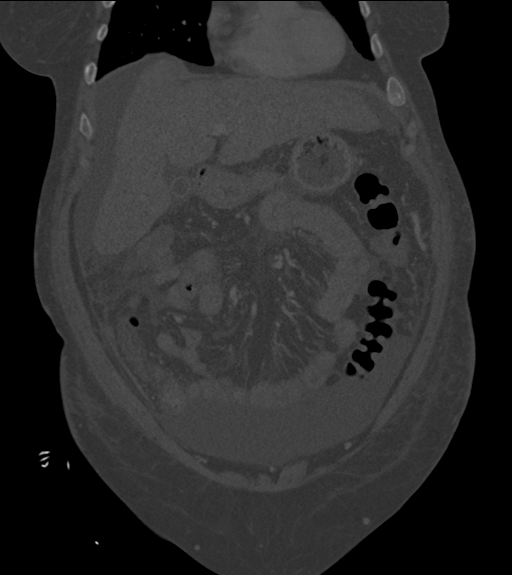
[im 90/150  soft-tissue]
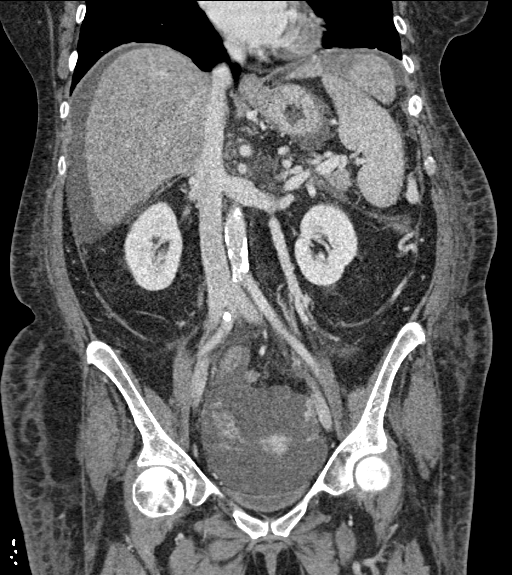

[Series 14: venous sagittal · sagittal · portal-venous · 0.70mm/px · 1 of 185 slices shown, 2 images]
[im 93/185  soft-tissue]
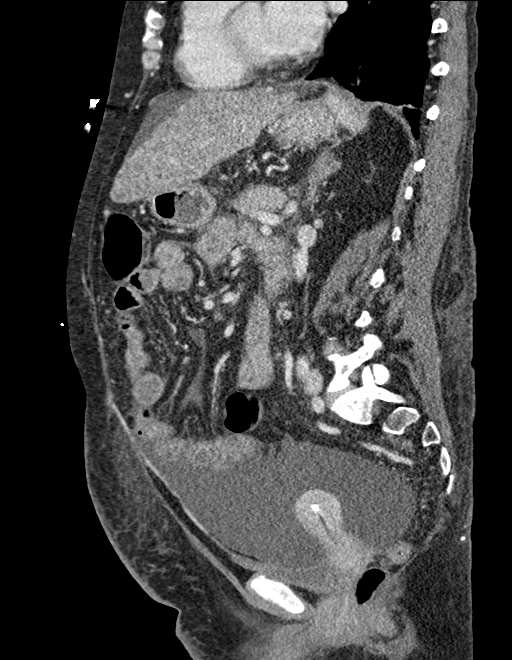
[im 93/185  bone]
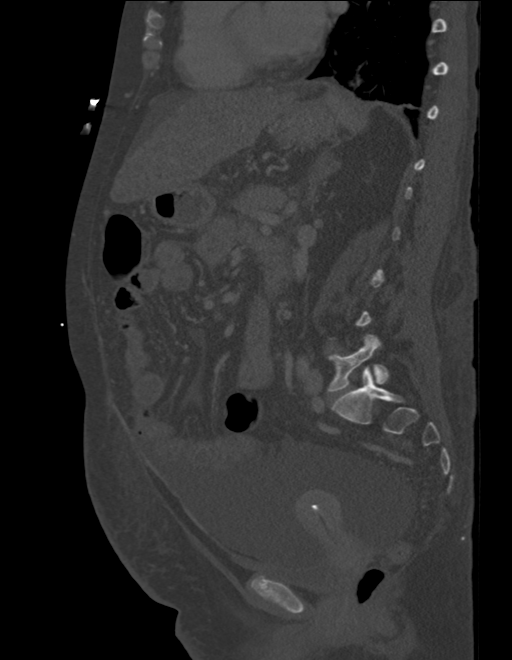

[12 of 46 positions shown; findings below may reference images not displayed]

FINDINGS: Lower chest: No significant pulmonary nodules or acute consolidative
airspace disease.

Hepatobiliary: Diffusely irregular liver surface with heterogeneous
liver parenchyma and relative hypertrophy of the lateral segment
left liver lobe, compatible with cirrhosis. Diffuse hepatic
steatosis. Segment 2 left liver dome 3.4 x 2.4 cm hypodense mass
(series 6/image 12), stable since [DATE] CT abdomen study, where
it was seen to represent a benign hemangioma. Indistinct 2.4 x
cm segment 7 right liver mass (series 6/image 17) without
appreciable arterial phase hyperenhancement or definite washout or
capsule formation, considered LR-3 (intermediate probability of
malignancy). No additional liver masses. No foci of arterial phase
hyperenhancement in the liver. Normal gallbladder with no radiopaque
cholelithiasis. No biliary ductal dilatation.

Pancreas: Normal, with no mass or duct dilation.

Spleen: Normal size. No mass.

Adrenals/Urinary Tract: Normal adrenals. No renal stones. No
hydronephrosis. No renal masses. Normal bladder.

Stomach/Bowel: Normal non-distended stomach. Normal caliber small
and large bowel with no bowel wall thickening. Normal appendix.

Vascular/Lymphatic: Atherosclerotic nonaneurysmal abdominal aorta.
Patent portal, splenic, hepatic and renal veins. Small paraumbilical
and gastroesophageal varices. Small splenorenal shunt. No
pathologically enlarged lymph nodes in the abdomen.

Other: No pneumoperitoneum. Small to moderate volume ascites. No
focal fluid collection. Mild anasarca.

Musculoskeletal: No aggressive appearing focal osseous lesions.
Redemonstrated nonunion of multiple chronic posterior lower right
rib fractures. Healed deformities in the posterior lower ribs
bilaterally. Mild thoracolumbar spondylosis.
IMPRESSION: 1. Cirrhosis. Diffuse hepatic steatosis.
2. Indistinct 2.4 cm segment 7 right liver mass without arterial
hyperenhancement, considered LR-3 (intermediate probability of
malignancy). Recommend follow-up MRI (preferred) or CT abdomen
without and with IV contrast in 3-6 months.
3. Small to moderate volume ascites. Mild anasarca. Small
paraumbilical and gastroesophageal varices. Small splenorenal shunt.
4. Aortic Atherosclerosis ([LO]-[LO]).

## 2019-08-19 MED ORDER — MAGNESIUM SULFATE 4 GM/100ML IV SOLN
4.0000 g | Freq: Once | INTRAVENOUS | Status: AC
  Administered 2019-08-19: 4 g via INTRAVENOUS
  Filled 2019-08-19: qty 100

## 2019-08-19 MED ORDER — POTASSIUM CHLORIDE CRYS ER 20 MEQ PO TBCR
40.0000 meq | EXTENDED_RELEASE_TABLET | ORAL | Status: AC
  Administered 2019-08-19 (×2): 40 meq via ORAL
  Filled 2019-08-19 (×2): qty 2

## 2019-08-19 MED ORDER — FUROSEMIDE 80 MG PO TABS: 80.0000 mg | ORAL_TABLET | Freq: Two times a day (BID) | ORAL | Status: DC

## 2019-08-19 MED ORDER — POTASSIUM CHLORIDE 10 MEQ/100ML IV SOLN
10.0000 meq | INTRAVENOUS | Status: DC
  Administered 2019-08-19 (×2): 10 meq via INTRAVENOUS
  Filled 2019-08-19 (×2): qty 100

## 2019-08-19 MED ORDER — FUROSEMIDE 40 MG PO TABS
40.0000 mg | ORAL_TABLET | Freq: Two times a day (BID) | ORAL | Status: DC
  Administered 2019-08-19 – 2019-08-20 (×2): 40 mg via ORAL
  Filled 2019-08-19 (×2): qty 1

## 2019-08-19 MED ORDER — POTASSIUM CHLORIDE 10 MEQ/100ML IV SOLN
10.0000 meq | INTRAVENOUS | Status: AC
  Filled 2019-08-19: qty 100

## 2019-08-19 MED ORDER — SPIRONOLACTONE 25 MG PO TABS
100.0000 mg | ORAL_TABLET | Freq: Two times a day (BID) | ORAL | Status: DC
  Administered 2019-08-19 – 2019-08-20 (×2): 100 mg via ORAL
  Filled 2019-08-19 (×2): qty 4

## 2019-08-19 MED ORDER — FUROSEMIDE 40 MG PO TABS
40.0000 mg | ORAL_TABLET | Freq: Every day | ORAL | Status: DC
  Administered 2019-08-19: 40 mg via ORAL
  Filled 2019-08-19: qty 1

## 2019-08-19 MED ORDER — IOHEXOL 350 MG/ML SOLN
100.0000 mL | Freq: Once | INTRAVENOUS | Status: AC | PRN
  Administered 2019-08-19: 100 mL via INTRAVENOUS

## 2019-08-19 NOTE — Progress Notes (Addendum)
CRITICAL VALUE ALERT  Critical Value:  Potassium  Date & Time Notied:  08/19/2019 0515  Provider Notified: Collene Gobble  Orders Received/Actions taken: awaiting

## 2019-08-19 NOTE — Progress Notes (Addendum)
Subjective:   Patient evaluated at bedside. She reports not feeling well. Her abd pain improving with pain medication. Has new back pain attributed to lying in bed.   Objective:  Vital signs in last 24 hours: Vitals:   08/18/19 2206 08/19/19 0046 08/19/19 0433 08/19/19 0731  BP: 109/70 119/72 109/75 118/78  Pulse: (!) 104  (!) 101 93  Resp: 15  11 12   Temp: 99.8 F (37.7 C) 100 F (37.8 C) 99 F (37.2 C) 98.4 F (36.9 C)  TempSrc: Oral Oral Oral Oral  SpO2: 92%  92% 95%  Weight:   69.5 kg   Height:        Physical Exam  Physical Exam Constitutional:      Appearance: She is ill-appearing.  Eyes:     General: Scleral icterus present.  Cardiovascular:     Rate and Rhythm: Normal rate and regular rhythm.     Heart sounds: Normal heart sounds.  Pulmonary:     Effort: No respiratory distress.  Abdominal:     Comments: Abdominal distention improves. Diffuse tenderness to palpation. No guarding. Normal bowel sound.  Musculoskeletal:     Right lower leg: Edema present.     Left lower leg: Edema present.     Comments: +2 lower extremity edema  Skin:    General: Skin is warm.     Coloration: Skin is jaundiced.  Neurological:     Mental Status: She is alert.  Psychiatric:     Comments: Depressed mood     Assessment/Plan: Kayla Sandoval is a 49 yo WF with PMH of alcohol abuse, cirrhosis with prior history of cirrhosis, HTN, depression, anxiety, admitted to the hospital for decompensated liver cirrhosis.   Principal Problem:   Decompensated liver disease ([place]) Active Problems:   Macrocytosis without anemia   MDD (major depressive disorder), recurrent severe, without psychosis ([place])   Alcohol abuse   Liver mass  Decompensated cirrhosis - Likely alcoholic cirrhosis given history of alcohol abuse and AST/ALT ratio of 2. Hep C and Hep B negative. Patient has prior history cirrhosis and ascites in June but patient failed to follow up with PCP after discharge.  - CT  abd/pel (07/2019) showed moderate ascites, cirrhosis and portal hypertension, and indeterminate areas of low attenuation within the liver, possibly small liver masses. RUQ [place] confirms cirrhosis, rules out gallstones and no biliary distention. Portal vein thrombosis detected.  - Patient does not need steroid given Maddrey's Discriminant score of 28.3.  MELD score of 23 (7-10% mortality in the next 3 months). She is not currently a candidate for liver transplant due to ongoing alcohol abuse.  -  Paracentesis consistent with ascites secondary to portal hypertension SAAG score > 1.1.  Pending cytology results.  - Total bil trending down 10.8 - 7.6. INR 1.5. Patient may have subclinical encephalopathy, which placing her towards acute hepatic failure.  - Continue Lactulose 30 g/Rifaximin 550 mg. Patient has 3-4 bowel movements in the last 24h.  - Wt drop 155 - 153. 600 cc urine output recorded in the last 24h. Continue Spironolactone 100 mg/ Lasix 40 mg PO daily. Compression stocking and encourage ambulation. Monitor BMP daily.  - Continue monitor I/O and daily wt.  - 1.2L fluid restriction. Low salt diet - Oxycodone 5 mg TID PRN for pain.  - Patient will need outpatient EGD for varices evaluation    Hypokalemia - K 2.6 this morning, likely secondary to Lasix  - Replete with 30 mEq potassium chloride IV  and 40 mEq KLOR PO - Follow up with PM BMP - Continue Spironolactone 100 mg and Lasix 40 mg PO daily.  Hyponatremia - Na 135 - 134 - 132 - Likely secondary to Lasix and Spironolactone - Continue to monitor daily BMP   Liver mass - CT abd/pel (07/2019) showed moderate ascites, cirrhosis and portal hypertension, and indeterminate areas of low attenuation within the liver, possibly small liver masses.  - Follow up MRI of the abdomen shows focal area within the posterior RIGHT hepatic lobe suspicious for hepatic neoplasm. Imaging was limited due to ascites.  - AFP normal  - Pending follow up triphasic  hepatic CT   Portal vein thrombosis - MRI abdomen shows minimal thrombus potentially in the LEFT portal vein - Patient is a poor candidate for anticoagulation   Pleural Effusion - CXR shows questionable LLL effusion - Continue to monitor I/O, daily wt - Follow 1.2L fluid restriction   Alcohol abuse - Blood Ethanol lv 313 on admission. She reports drinking 8 beers a day.  - Add Thiamine and Folate  - CIWA protocol without Ativan in place.    HTN - BP is in normal range - Continue to monitor   Diet: thin fluid IVF: NA CODE: Full DVT: Lovenox   Prior to Admission Living Arrangement: home Anticipated Discharge Location: home Barriers to Discharge: hypervolemia and ascites Dispo: Anticipated discharge in approximately 3 day(s).   Gaylan Gerold, DO 08/19/2019, 8:25 AM Pager: [phone] After 5pm on weekdays and 1pm on weekends: On Call pager [phone]

## 2019-08-19 NOTE — Progress Notes (Addendum)
[facility] Progress Note  CC:  EtOH Cirrhosis  Subjective: She is having intermittent nausea, no vomiting. No abdominal pain. She passed 3 loose yellow stools this morning. No other complaints at this time.   Objective:  Abdominal MRI w/wo contrast 08/18/2019: 1. Study limited by abdominal ascites. Focal area within the posterior RIGHT hepatic lobe suspicious for hepatic neoplasm. Given lack of good late arterial phase imaging features suggest either LI-RADS 5 or LR M lesion. Would suggest short interval follow-up with CT as the amount of ascites limits assessment at 3 tesla in this patient. The liver is overall heterogeneous and there may be other lesions that could be missed given lack of late arterial phase. 2. Lesion in the LEFT hepatic lobe displays characteristic features of a hemangioma likely partially sclerosed based on comparison with previous imaging. 3. Cirrhosis with stigmata of portal hypertension including splenomegaly and varices. 4. RIGHT colonic thickening greater than other areas of bowel edema may represent portal colopathy and changes of portal hypertension. 5. Question portal venous thrombus now with only minimal thrombus potentially in the LEFT portal vein. SMV and main portal vein appear patent. Perhaps very slow flow within the portal vein cause lack of color Doppler due to technical factors on previous ultrasound. 6. Sludge in the gallbladder with mild wall thickening. No specific signs of cholecystitis in this patient with portal hypertension and cirrhosis. Gallbladder with unchanged appearance dating back to June 11th. 7. Small LEFT pleural effusion.  Vital signs in last 24 hours: Temp:  [98.4 F (36.9 C)-100 F (37.8 C)] 98.9 F (37.2 C) (07/08 0800) Pulse Rate:  [93-104] 99 (07/08 0800) Resp:  [11-16] 12 (07/08 0800) BP: (109-122)/(70-81) 118/78 (07/08 0800) SpO2:  [92 %-95 %] 93 % (07/08 0800) Weight:  [69.5 kg] 69.5 kg  (07/08 0433) Last BM Date: 08/18/19   General:   Alert in NAD. Eyes: Moderate scleral icterus.  Heart: RRR, no murmur.  Pulm:  Breath sounds clear, diminished LLL.  Abdomen: Protuberant, ascites but the abdomen is not tense. Generalized mild tenderness without rebound or guarding. RLQ paracentesis drsg site intact. Moderate hepatomegaly. + BS x 4 quads.  Extremities:  Without edema. Neurologic:  Alert and  oriented x4;  grossly normal neurologically. No asterixis.  Psych:  Alert and cooperative. Normal mood and affect.  Intake/Output from previous day: 07/07 0701 - 07/08 0700 In: 480 [P.O.:480] Out: 650 [Urine:650] Intake/Output this shift: No intake/output data recorded.  Lab Results: Recent Labs    08/17/19 0246 08/18/19 0436 08/19/19 0309  WBC 5.1 4.6 4.7  HGB 12.3 13.0 12.8  HCT 35.8* 38.2 38.0  PLT 124* 89* 94*   BMET Recent Labs    08/17/19 0246 08/18/19 0436 08/19/19 0309  NA 135 134* 132*  K 3.8 3.5 2.6*  CL 101 97* 93*  CO2 '24 26 31  ' GLUCOSE 91 90 118*  BUN <5* 6 <5*  CREATININE 0.37* 0.45 0.55  CALCIUM 7.4* 7.8* 7.5*   LFT Recent Labs    08/19/19 0309  PROT 6.1*  ALBUMIN 2.1*  AST 139*  ALT 30  ALKPHOS 234*  BILITOT 7.6*  BILIDIR 4.1*  IBILI 3.5*   PT/INR Recent Labs    08/18/19 0436 08/19/19 0309  LABPROT 17.6* 17.9*  INR 1.5* 1.5*   Hepatitis Panel Recent Labs    08/17/19 1016  HCVAB NON REACTIVE    MR ABDOMEN W WO CONTRAST  Result Date: 08/17/2019 CLINICAL DATA:  Evaluation of liver mass in a  patient with history of cirrhosis. Abnormality discovered on abdominal sonogram. EXAM: MRI ABDOMEN WITHOUT AND WITH CONTRAST TECHNIQUE: Multiplanar multisequence MR imaging of the abdomen was performed both before and after the administration of intravenous contrast. CONTRAST:  7.58m GADAVIST GADOBUTROL 1 MMOL/ML IV SOLN COMPARISON:  Abdominal sonogram August 17, 2019 FINDINGS: Lower chest: Small LEFT pleural effusion. No dense consolidation.  Limited assessment on MRI. Hepatobiliary: Liver displays cirrhotic morphology. In the LEFT portal vein there is irregularity along the anterior wall (image 49 of series 804) potentially related to small amount of thrombus. Main portal vein is patent as is the SMV. The splenic vein is patent. Portosystemic collaterals in the upper abdomen. Cirrhotic and steatotic nodules. Lesion in the LEFT hepatic lobe displays very little enhancement mainly at the periphery in this is best seen on coronal imaging. Lesion displayed characteristic features of hemangioma on a previous imaging study and displays slightly increased T2 signal which is limited on today's assessment in terms of T2 evaluation in the setting of diffuse abdominal ascites. Vague area of abnormality in the posterior RIGHT hepatic lobe 2.5 x 2.1 cm on the axial dataset. Note that the imaging is acquired in venous phase rather than late arterial. Perhaps patient condition limiting bolus timing. This is also seen on coronal imaging. Best displayed on image 45 of series 9. No additional lesion aside from multiple nodular areas within the liver. Gallbladder with mild distension. Sludge in the lumen. Mild wall thickening. No specific signs of cholecystitis however. Pancreas: Pancreas without signs of peripancreatic stranding beyond the amount of edema that is seen elsewhere in the abdomen in the setting of portal hypertension. Spleen: Spleen enlarged approximately 13-14 cm greatest craniocaudal dimension. Adrenals/Urinary Tract: Normal adrenal glands with smooth renal contours. No suspicious renal lesion. Stomach/Bowel: Diffuse bowel edema in the setting of portal hypertension. RIGHT colon is particularly thickened. Vascular/Lymphatic: Patent abdominal vasculature. Question small thrombus in LEFT portal venous branch as discussed. Other:  Volume of ascites is similar to recent CT from July 23, 2019 Musculoskeletal: No suspicious bone lesions identified. IMPRESSION:  1. Study limited by abdominal ascites. Focal area within the posterior RIGHT hepatic lobe suspicious for hepatic neoplasm. Given lack of good late arterial phase imaging features suggest either LI-RADS 5 or LR M lesion. Would suggest short interval follow-up with CT as the amount of ascites limits assessment at 3 tesla in this patient. The liver is overall heterogeneous and there may be other lesions that could be missed given lack of late arterial phase. 2. Lesion in the LEFT hepatic lobe displays characteristic features of a hemangioma likely partially sclerosed based on comparison with previous imaging. 3. Cirrhosis with stigmata of portal hypertension including splenomegaly and varices. 4. RIGHT colonic thickening greater than other areas of bowel edema may represent portal colopathy and changes of portal hypertension. 5. Question portal venous thrombus now with only minimal thrombus potentially in the LEFT portal vein. SMV and main portal vein appear patent. Perhaps very slow flow within the portal vein cause lack of color Doppler due to technical factors on previous ultrasound. 6. Sludge in the gallbladder with mild wall thickening. No specific signs of cholecystitis in this patient with portal hypertension and cirrhosis. Gallbladder with unchanged appearance dating back to June 11th. 7. Small LEFT pleural effusion. Electronically Signed   By: GZetta BillsM.D.   On: 08/17/2019 19:51    Assessment / Plan:  1.  Decompensated cirrhosis:  Admission Madrey's discriminant score 28.3.  LFTs drifting downward. AST 139.  ALT 30. T. Bili 7.6. Alk phos 234. Abdominal MRI 08/17/2019 concerning for right hepatic lobe neoplasm, cirrhosis with stigmata of portal HTN including splenomegaly and varices. Right colon thickening most likely due to portal HTN. Questionable minimal thrombosis to the left portal vein. S/P paracentesis 3.7 L of peritoneal fluid removed on 7/6. No evidence of SBP.  SAAG > 1.1 and peritoneal  protein < 3.0 consistent with cirrhosis. Peritoneal cytology not done. No signs of hepatic encephalopathy at this time. - Triphasic liver CT ordered, not yet completed.  - Repeat hepatic panel, INR in am - I added cytology to peritoneal fluid collected at the time of paracentesis 7/6. If the lab is not able to add cytology, patient will need eventual repeat paracentesis to   include cystology -Continue Lactulose 30gm po bid to titrate to no more than 3-4 BMs daily. -Continue Xifaxan 544m po bid -She will need eventual EGD to further assess varices  - Further recommendations per Dr. GLyndel Safe  2. Coagulopathy secondary to # 1. INR 1.5. On Vitamin K 180mSQ daily  3. Hypokalemia. K+ 2.6. -Management per the hospitalist  -Repeat BMP in am  4. Hyponatremia secondary to diuretics and cirrhosis.  NA+ 132. Currently on Furosemide 4065maily and Spironolactone 100m65mily.  Diuretics may need to be decreased.  -Repeat BMP in am  5.  Alcohol abuse -No alcohol ever discussed with the patient     Principal Problem:   Decompensated liver disease (HCC)[place] Problems:   Macrocytosis without anemia   MDD (major depressive disorder), recurrent severe, without psychosis (HCC)[place]   Liver mass     LOS: 3 days   CollNoralyn Pick8/2021, 12:14 PM     Attending physician's note   I have taken an interval history, reviewed the chart and examined the patient. I agree with the Advanced Practitioner's note, impression and recommendations.   Decompensated ETOH liver cirrhosis.  Not a candidate for liver transplant d/t continued alcohol abuse (last drink 7/4 AM). LFTs improving.  ?Hepatic mass on MRI 08/17/2019. Nl AFP.   Mod Ascites with generalized anasarca s/p LVP. SAAG c/w portal hypertension. No SBP  Jaundice, hypersplenism with thrombocytopenia, coagulopathy, hypoalbuminemia.  Subclinical hepatic encephalopathy.  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  If abn, may need liver bx. -Monitor and correct electrolytes. -Continue current treatment -Trend CBC, LFTs, PT, lytes. -Low-salt normal protein diet. -Ambulate. -She has assured me that she will not drink anymore alcohol.  She always is very tearful.    Raj Carmell [place] LebaVelora [id number] [phone]

## 2019-08-19 NOTE — Progress Notes (Signed)
Received additional order for PT Evaluation. Pt was evaluated and discharged from acute PT, please see "Evaluation" note from 08/18/19. Pt mobilizing independent with acute PT needs identified. Will sign off. Please reconsult if new needs arise.  Mabeline Caras, PT, DPT Acute Rehabilitation Services  Pager [phone] Office [phone]

## 2019-08-20 ENCOUNTER — Inpatient Hospital Stay (HOSPITAL_COMMUNITY): Payer: Self-pay

## 2019-08-20 DIAGNOSIS — R600 Localized edema: Secondary | ICD-10-CM

## 2019-08-20 LAB — HEPATIC FUNCTION PANEL
ALT: 31 U/L (ref 0–44)
AST: 144 U/L — ABNORMAL HIGH (ref 15–41)
Albumin: 2.2 g/dL — ABNORMAL LOW (ref 3.5–5.0)
Alkaline Phosphatase: 214 U/L — ABNORMAL HIGH (ref 38–126)
Bilirubin, Direct: 3.5 mg/dL — ABNORMAL HIGH (ref 0.0–0.2)
Indirect Bilirubin: 3.5 mg/dL — ABNORMAL HIGH (ref 0.3–0.9)
Total Bilirubin: 7 mg/dL — ABNORMAL HIGH (ref 0.3–1.2)
Total Protein: 6.4 g/dL — ABNORMAL LOW (ref 6.5–8.1)

## 2019-08-20 LAB — BASIC METABOLIC PANEL
Anion gap: 10 (ref 5–15)
BUN: <5 mg/dL — ABNORMAL LOW (ref 6–20)
CO2: 27 mmol/L (ref 22–32)
Calcium: 7.7 mg/dL — ABNORMAL LOW (ref 8.9–10.3)
Chloride: 96 mmol/L — ABNORMAL LOW (ref 98–111)
Creatinine, Ser: 0.46 mg/dL (ref 0.44–1.00)
GFR calc Af Amer: >60 mL/min (ref >60)
GFR calc non Af Amer: >60 mL/min (ref >60)
Glucose, Bld: 104 mg/dL — ABNORMAL HIGH (ref 70–99)
Potassium: 3.1 mmol/L — ABNORMAL LOW (ref 3.5–5.1)
Sodium: 133 mmol/L — ABNORMAL LOW (ref 135–145)

## 2019-08-20 LAB — PROTIME-INR
INR: 1.4 — ABNORMAL HIGH (ref 0.8–1.2)
Prothrombin Time: 16.8 seconds — ABNORMAL HIGH (ref 11.4–15.2)

## 2019-08-20 LAB — ECHOCARDIOGRAM COMPLETE
Height: 64 in
Weight: 2456 oz

## 2019-08-20 LAB — CBC
HCT: 38.4 % (ref 36.0–46.0)
Hemoglobin: 13.2 g/dL (ref 12.0–15.0)
MCH: 37.1 pg — ABNORMAL HIGH (ref 26.0–34.0)
MCHC: 34.4 g/dL (ref 30.0–36.0)
MCV: 107.9 fL — ABNORMAL HIGH (ref 80.0–100.0)
Platelets: 91 10*3/uL — ABNORMAL LOW (ref 150–400)
RBC: 3.56 MIL/uL — ABNORMAL LOW (ref 3.87–5.11)
RDW: 13.6 % (ref 11.5–15.5)
WBC: 5.2 10*3/uL (ref 4.0–10.5)
nRBC: 0 % (ref 0.0–0.2)

## 2019-08-20 LAB — MAGNESIUM: Magnesium: 1.4 mg/dL — ABNORMAL LOW (ref 1.7–2.4)

## 2019-08-20 LAB — CYTOLOGY - NON PAP

## 2019-08-20 MED ORDER — POTASSIUM CHLORIDE CRYS ER 20 MEQ PO TBCR
40.0000 meq | EXTENDED_RELEASE_TABLET | ORAL | Status: AC
  Administered 2019-08-20 (×2): 40 meq via ORAL
  Filled 2019-08-20 (×2): qty 2

## 2019-08-20 MED ORDER — FUROSEMIDE 40 MG PO TABS
60.0000 mg | ORAL_TABLET | Freq: Two times a day (BID) | ORAL | Status: DC
  Administered 2019-08-20 – 2019-08-23 (×6): 60 mg via ORAL
  Filled 2019-08-20 (×6): qty 1

## 2019-08-20 MED ORDER — SPIRONOLACTONE 25 MG PO TABS
150.0000 mg | ORAL_TABLET | Freq: Two times a day (BID) | ORAL | Status: DC
  Administered 2019-08-20 – 2019-08-23 (×6): 150 mg via ORAL
  Filled 2019-08-20 (×6): qty 6

## 2019-08-20 MED ORDER — POTASSIUM CHLORIDE CRYS ER 20 MEQ PO TBCR
20.0000 meq | EXTENDED_RELEASE_TABLET | Freq: Two times a day (BID) | ORAL | Status: DC
  Administered 2019-08-21 (×2): 20 meq via ORAL
  Filled 2019-08-20 (×2): qty 1

## 2019-08-20 MED ORDER — LACTULOSE 10 GM/15ML PO SOLN
20.0000 g | Freq: Two times a day (BID) | ORAL | Status: DC
  Administered 2019-08-20 (×2): 20 g via ORAL
  Filled 2019-08-20 (×2): qty 30

## 2019-08-20 MED ORDER — THIAMINE HCL 100 MG PO TABS
250.0000 mg | ORAL_TABLET | Freq: Every day | ORAL | Status: DC
  Administered 2019-08-20 – 2019-08-23 (×4): 250 mg via ORAL
  Filled 2019-08-20 (×4): qty 3

## 2019-08-20 NOTE — Progress Notes (Addendum)
[facility] Progress Note  CC:  EtOH Cirrhosis  Subjective: She is tearful. She ate 1/2 sandwich for lunch. Nausea comes and goes. No vomiting. Generalized abdominal pain about the same, no severe abdominal pain. She has passed 3 to 4 loose light brown to yellow stools today. ECHO done today. See results below.    Objective:   ECHO 08/19/2019: 1. Left ventricular ejection fraction, by estimation, is 60 to 65%. The left ventricle has normal function. The left ventricle has no regional wall motion abnormalities. Left ventricular diastolic parameters were normal. 2. Right ventricular systolic function is normal. The right ventricular size is normal. 3. The mitral valve is normal in structure. No evidence of mitral valve regurgitation. 4. The aortic valve is normal in structure. Aortic valve regurgitation is not visualized. 5. The inferior vena cava is normal in size with greater than 50% respiratory vari  Triphasic Liver CT 08/19/2019:  1. Cirrhosis. Diffuse hepatic steatosis. 2. Indistinct 2.4 cm segment 7 right liver mass without arterial hyperenhancement, considered LR-3 (intermediate probability of malignancy). Recommend follow-up MRI (preferred) or CT abdomen without and with IV contrast in 3-6 months. 3. Small to moderate volume ascites. Mild anasarca. Small paraumbilical and gastroesophageal varices. Small splenorenal shunt. 4. Aortic Atherosclerosis   Abdominal MRI w/wo contrast 08/18/2019: 1. Study limited by abdominal ascites. Focal area within the posterior RIGHT hepatic lobe suspicious for hepatic neoplasm. Given lack of good late arterial phase imaging features suggest either LI-RADS 5 or LR M lesion. Would suggest short interval follow-up with CT as the amount of ascites limits assessment at 3 tesla in this patient. The liver is overall heterogeneous and there may be other lesions that could be missed given lack of late arterial phase. 2. Lesion in the  LEFT hepatic lobe displays characteristic features of a hemangioma likely partially sclerosed based on comparison with previous imaging. 3. Cirrhosis with stigmata of portal hypertension including splenomegaly and varices. 4. RIGHT colonic thickening greater than other areas of bowel edema may represent portal colopathy and changes of portal hypertension. 5. Question portal venous thrombus now with only minimal thrombus potentially in the LEFT portal vein. SMV and main portal vein appear patent. Perhaps very slow flow within the portal vein cause lack of color Doppler due to technical factors on previous ultrasound. 6. Sludge in the gallbladder with mild wall thickening. No specific signs of cholecystitis in this patient with portal hypertension and cirrhosis. Gallbladder with unchanged appearance dating back to June 11th. 7. Small LEFT pleural effusion  Triphasic Liver CT 08/19/2019:  Vital signs in last 24 hours: Temp:  [98.2 F (36.8 C)-100.1 F (37.8 C)] 98.7 F (37.1 C) (07/09 1228) Pulse Rate:  [80-104] 90 (07/09 1228) Resp:  [12-19] 13 (07/09 1228) BP: (107-121)/(67-97) 120/80 (07/09 1228) SpO2:  [93 %-98 %] 93 % (07/09 1228) Weight:  [69.6 kg] 69.6 kg (07/09 0428) Last BM Date: 08/19/19   General: Fatigued appearing female, jaundiced in NAD.  Eyes: Moderate scleral icterus present.  Heart: RRR, no murmur.  Pulm:  Breath sounds clear throughout, diminished in the bases. Abdomen: Distended, ascites but the abdomen is not tense. Moderate hepatomegaly. Generalized tenderness throughout. No rebound or guarding. Positive bowel sounds x 4 quads.  Extremities:  Without edema. Compression stockings in use bilaterally.  Neurologic:  Alert and  oriented x4;  grossly normal neurologically. Psych:  Alert and cooperative. She is tearful.   Intake/Output from previous day: 07/08 0701 - 07/09 0700 In: 480 [P.O.:480] Out: 700 [  Urine:700] Intake/Output this shift: No intake/output  data recorded.  Lab Results: Recent Labs    08/18/19 0436 08/19/19 0309 08/20/19 0229  WBC 4.6 4.7 5.2  HGB 13.0 12.8 13.2  HCT 38.2 38.0 38.4  PLT 89* 94* 91*   BMET Recent Labs    08/19/19 0309 08/19/19 1600 08/20/19 0229  NA 132* 132* 133*  K 2.6* 3.6 3.1*  CL 93* 98 96*  CO2 '31 27 27  ' GLUCOSE 118* 139* 104*  BUN <5* 5* <5*  CREATININE 0.55 0.48 0.46  CALCIUM 7.5* 7.4* 7.7*   LFT Recent Labs    08/20/19 0229  PROT 6.4*  ALBUMIN 2.2*  AST 144*  ALT 31  ALKPHOS 214*  BILITOT 7.0*  BILIDIR 3.5*  IBILI 3.5*   PT/INR Recent Labs    08/19/19 0309 08/20/19 0229  LABPROT 17.9* 16.8*  INR 1.5* 1.4*   Hepatitis Panel No results for input(s): HEPBSAG, HCVAB, HEPAIGM, HEPBIGM in the last 72 hours.  ECHOCARDIOGRAM COMPLETE  Result Date: 08/20/2019    ECHOCARDIOGRAM REPORT   Patient Name:   Kayla Sandoval Date of Exam: 08/20/2019 Medical Rec #:  [id number]       Height:       64.0 in Accession #:    [id number]      Weight:       153.5 lb Date of Birth:  05/09/1970        BSA:          1.748 m Patient Age:    49 years        BP:           107/67 mmHg Patient Gender: F               HR:           80 bpm. Exam Location:  Inpatient Procedure: 2D Echo Indications:    Dyspnea 786.09 / R06.00  History:        Patient has no prior history of Echocardiogram examinations.                 Risk Factors:Hypertension. Decompensated cirrhosis. ETOH use.                 Anxiety.  Sonographer:    Darlina Sicilian RDCS Referring Phys: [id number] Raynaldo Opitz RAINES IMPRESSIONS  1. Left ventricular ejection fraction, by estimation, is 60 to 65%. The left ventricle has normal function. The left ventricle has no regional wall motion abnormalities. Left ventricular diastolic parameters were normal.  2. Right ventricular systolic function is normal. The right ventricular size is normal.  3. The mitral valve is normal in structure. No evidence of mitral valve regurgitation.  4. The aortic valve is normal in  structure. Aortic valve regurgitation is not visualized.  5. The inferior vena cava is normal in size with greater than 50% respiratory variability, suggesting right atrial pressure of 3 mmHg. FINDINGS  Left Ventricle: Left ventricular ejection fraction, by estimation, is 60 to 65%. The left ventricle has normal function. The left ventricle has no regional wall motion abnormalities. The left ventricular internal cavity size was normal in size. There is  no left ventricular hypertrophy. Left ventricular diastolic parameters were normal. Right Ventricle: The right ventricular size is normal. Right vetricular wall thickness was not assessed. Right ventricular systolic function is normal. Left Atrium: Left atrial size was normal in size. Right Atrium: Right atrial size was normal in size. Pericardium: There is no evidence of pericardial effusion.  Mitral Valve: The mitral valve is normal in structure. No evidence of mitral valve regurgitation. Tricuspid Valve: The tricuspid valve is normal in structure. Tricuspid valve regurgitation is trivial. Aortic Valve: The aortic valve is normal in structure. Aortic valve regurgitation is not visualized. Pulmonic Valve: The pulmonic valve was not well visualized. Pulmonic valve regurgitation is not visualized. Aorta: The aortic root is normal in size and structure. Venous: The inferior vena cava is normal in size with greater than 50% respiratory variability, suggesting right atrial pressure of 3 mmHg. IAS/Shunts: No atrial level shunt detected by color flow Doppler.  LEFT VENTRICLE PLAX 2D LVIDd:         4.41 cm  Diastology LVIDs:         2.85 cm  LV e' lateral:   12.40 cm/s LV PW:         0.67 cm  LV E/e' lateral: 4.2 LV IVS:        1.06 cm  LV e' medial:    8.27 cm/s LVOT diam:     2.10 cm  LV E/e' medial:  6.3 LV SV:         56 LV SV Index:   32 LVOT Area:     3.46 cm  RIGHT VENTRICLE RV S prime:     16.00 cm/s TAPSE (M-mode): 2.3 cm LEFT ATRIUM           Index       RIGHT  ATRIUM           Index LA diam:      3.10 cm 1.77 cm/m  RA Area:     11.50 cm LA Vol (A2C): 16.0 ml 9.15 ml/m  RA Volume:   25.40 ml  14.53 ml/m LA Vol (A4C): 36.6 ml 20.93 ml/m  AORTIC VALVE LVOT Vmax:   94.20 cm/s LVOT Vmean:  65.400 cm/s LVOT VTI:    0.162 m  AORTA Ao Root diam: 3.10 cm MITRAL VALVE MV Area (PHT): 4.21 cm    SHUNTS MV Decel Time: 180 msec    Systemic VTI:  0.16 m MV E velocity: 51.80 cm/s  Systemic Diam: 2.10 cm MV A velocity: 63.00 cm/s MV E/A ratio:  0.82 Dorris Carnes MD Electronically signed by Dorris Carnes MD Signature Date/Time: 08/20/2019/11:08:00 AM    Final    CT LIVER ABDOMEN W WO CONTRAST  Result Date: 08/20/2019 CLINICAL DATA:  Decompensated cirrhosis. Inpatient. Indeterminate posterior right liver mass on recent MRI. EXAM: CT ABDOMEN WITHOUT AND WITH CONTRAST TECHNIQUE: Multidetector CT imaging of the abdomen was performed following the standard protocol before and following the bolus administration of intravenous contrast. CONTRAST:  170m OMNIPAQUE IOHEXOL 350 MG/ML SOLN COMPARISON:  08/17/2019 MRI abdomen.  07/23/2019 CT abdomen/pelvis. FINDINGS: Lower chest: No significant pulmonary nodules or acute consolidative airspace disease. Hepatobiliary: Diffusely irregular liver surface with heterogeneous liver parenchyma and relative hypertrophy of the lateral segment left liver lobe, compatible with cirrhosis. Diffuse hepatic steatosis. Segment 2 left liver dome 3.4 x 2.4 cm hypodense mass (series 6/image 12), stable since 11/03/2015 CT abdomen study, where it was seen to represent a benign hemangioma. Indistinct 2.4 x 2.0 cm segment 7 right liver mass (series 6/image 17) without appreciable arterial phase hyperenhancement or definite washout or capsule formation, considered LR-3 (intermediate probability of malignancy). No additional liver masses. No foci of arterial phase hyperenhancement in the liver. Normal gallbladder with no radiopaque cholelithiasis. No biliary ductal  dilatation. Pancreas: Normal, with no mass or duct dilation. Spleen: Normal size.  No mass. Adrenals/Urinary Tract: Normal adrenals. No renal stones. No hydronephrosis. No renal masses. Normal bladder. Stomach/Bowel: Normal non-distended stomach. Normal caliber small and large bowel with no bowel wall thickening. Normal appendix. Vascular/Lymphatic: Atherosclerotic nonaneurysmal abdominal aorta. Patent portal, splenic, hepatic and renal veins. Small paraumbilical and gastroesophageal varices. Small splenorenal shunt. No pathologically enlarged lymph nodes in the abdomen. Other: No pneumoperitoneum. Small to moderate volume ascites. No focal fluid collection. Mild anasarca. Musculoskeletal: No aggressive appearing focal osseous lesions. 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 Healed deformities in the posterior lower ribs bilaterally. Mild thoracolumbar spondylosis. IMPRESSION: 1. Cirrhosis. Diffuse hepatic steatosis. 2. Indistinct 2.4 cm segment 7 right liver mass without arterial hyperenhancement, considered LR-3 (intermediate probability of malignancy). Recommend follow-up MRI (preferred) or CT abdomen without and with IV contrast in 3-6 months. 3. Small to moderate volume ascites. Mild anasarca. Small paraumbilical and gastroesophageal varices. Small splenorenal shunt. 4. Aortic Atherosclerosis (ICD10-I70.0). Electronically Signed   By: Ilona Sorrel M.D.   On: 08/20/2019 08:34    Assessment / Plan:  1. Decompensated cirrhosis: Admission Madrey's discriminant score 28.3.  LFTs stable. Alk phos 214. AST 144. ALT 31. T. Bili 7.0. Abdominal MRI 08/17/2019 concerning for right hepatic lobe neoplasm, cirrhosis with stigmata of portal HTN including splenomegaly and varices. Right colon thickening most likely due to portal HTN. Questionable minimal thrombosis to the left portal vein. Triphasic liver CT 7/8 showed cirrhosis, diffuse hepatic steatosis, 2.4 cm right liver mass  and small to moderate volume of ascites and anasarca. S/P paracentesis 3.7 L of peritoneal fluid removed on 7/6. No evidence of SBP.  SAAG > 1.1 and peritoneal protein < 3.0 consistent with cirrhosis. Peritoneal cytology without evidence of malignancy. On Lactulose and Xifaxan. No signs of hepatic encephalopathy at this time. ECHO LV EF 60 -65%. Normal RV function.  - Repeat hepatic panel, INR in am -Continue Lactulose 30gm po bid to titrate to no more than 3-4 BMs daily. -Continue Xifaxan 565m po bid -Consider biopsy of the 2.4 cm right liver mass.  Further recommendations per Dr. GLyndel Safe  2. Coagulopathy secondary to cirrhosis. INR 1.4. On Vitamin K 142mSQ daily  3. Hypokalemia. K+ 3.1.  -Management per the hospitalist  -Repeat BMP in am  4. Hyponatremia secondary to diuretics and cirrhosis.  NA+ 132. Currently on Furosemide 4064maily and Spironolactone 100m38mily.  Diuretics may need to be decreased.  -Repeat BMP in am  5. Thrombocytopenia secondary to cirrhosis. PLT 91.   6. Alcohol abuse -No alcohol ever discussed with the patient         Principal Problem:   Decompensated liver disease (HCC)[place] Problems:   Macrocytosis without anemia   MDD (major depressive disorder), recurrent severe, without psychosis (HCC)[place]   Liver mass     LOS: 4 days   CollPatrecia Pournedy-Smith  08/20/2019, 2:17 PM      Attending physician's note   I have taken an interval history, reviewed the chart and examined the patient. I agree with the Advanced Practitioner's note, impression and recommendations.   DecompensatedETOHliver cirrhosis.MELD Na- 21 (today). Child's class C. Not a candidate for liver transplant d/tcontinued alcohol abuse(lastdrink7/4 AM).LFTs improving.   Hepatic lesion on MRI 08/17/2019. Nl AFP. Triphasic CT shows 2.4 cm ill-defined right liver lesion without arterial enhancement (LR-3).  Cannot be safely biopsied d/t location  (IR).  Ascites s/p LVP. SAAG c/w portal hypertension. No SBP. On diuretics  Jaundice, hypersplenism with thrombocytopenia,coagulopathy,hypoalbuminemia.  Subclinical hepatic encephalopathy.  Paraesophageal/intra-abdominal varices on CT  Malnutrition.   Plan: -I have discussed with IR (Dr. Vernard Gambles).  Liver lesion too high up in the dome for safe bx. He recommends to repeat triphasic CT (not MRI) in 3 months.  If there is any change, then MRI/Bx (then risks will outweigh the benefits). -Continue supportive treatment -Continue diuretics.  Can adjust the dose. -Trend CBC, CMP, lytes. -FU in GI clinic in 4-6 weeks. -Expect slow improvement in LFTs. -No alcohol. -Will sign off for now. Pl call with any ?  Carmell Austria, MD Velora Heckler Fabienne Bruns [phone].

## 2019-08-20 NOTE — Progress Notes (Signed)
  Echocardiogram 2D Echocardiogram has been performed.  [name] 08/20/2019, 10:49 AM

## 2019-08-20 NOTE — Progress Notes (Addendum)
Subjective:   Patient is seen at bedside today. Patient tearful on exam this morning due to abd pain primarily. She states that she has pain relief for ~6 hr with prn pain medications. Reports diffuse cramping as well and back pain whihc has improved with heat pad.   Patient states that she has been trying to ambulate with help and putting on compression stocking. She states that her edema has improved.   She has about 5-6 loose bowel movements since last night.   Objective:  Vital signs in last 24 hours: Vitals:   08/19/19 2055 08/20/19 0016 08/20/19 0428 08/20/19 0434  BP: (!) 119/97 114/80  121/74  Pulse: 99 96  92  Resp: 12 15  19   Temp: 100.1 F (37.8 C) 98.7 F (37.1 C)  99.7 F (37.6 C)  TempSrc: Oral Oral  Oral  SpO2: 98% 97%  97%  Weight:   69.6 kg   Height:        Physical Exam  Physical Exam Constitutional:      Appearance: She is ill-appearing.  Eyes:     General: Scleral icterus (improved) present.  Cardiovascular:     Rate and Rhythm: Normal rate and regular rhythm.     Heart sounds: Normal heart sounds.  Pulmonary:     Effort: No respiratory distress.     Breath sounds: Normal breath sounds.  Abdominal:     General: Bowel sounds are normal. There is distension (mild).     Tenderness: There is abdominal tenderness (diffuse tenderness to palpation).  Musculoskeletal:     Right lower leg: Edema (+2, improved) present.     Left lower leg: Edema (+2, improved) present.  Skin:    General: Skin is warm.     Coloration: Skin is jaundiced.  Neurological:     Mental Status: She is alert.  Psychiatric:     Comments: Depressed mood     Assessment/Plan: Kayla Sandoval Kayla Sandoval is Sandoval 49 yo WF with PMH of alcohol abuse, cirrhosis with prior history of cirrhosis, HTN, depression, anxiety, admitted to the hospital for decompensated liver cirrhosis.   Principal Problem:   Decompensated liver disease ([place]) Active Problems:   Macrocytosis without anemia   MDD  (major depressive disorder), recurrent severe, without psychosis ([place])   Alcohol abuse   Liver mass  Decompensated cirrhosis - Likely alcoholic cirrhosis given history of alcohol abuse and AST/ALT ratio of 2. Hep C and Hep B negative. Patient has prior history cirrhosis and ascites in June but patient failed to follow up with PCP after discharge.  - CT abd/pel (07/2019) showed moderate ascites, cirrhosis and portal hypertension, and indeterminate areas of low attenuation within the liver, possibly small liver masses. RUQ [place] confirms cirrhosis, rules out gallstones and no biliary distention. Portal vein thrombosis detected.  - Patient does not need steroid given Maddrey's Discriminant score of 28.3.  MELD score of 23 (7-10% mortality in the next 3 months). She is not currently Sandoval candidate for liver transplant due to ongoing alcohol abuse.  -  Paracentesis consistent with ascites secondary to portal hypertension SAAG score > 1.1.  Pending cytology results.  - LFT slowly improving. Total bil trending down 10.8 - 7.6 - 70. INR 1.5 -1.4. Patient may have subclinical encephalopathy, which placing her towards acute hepatic failure.  - Decrease Lactulose to 20 g (patient have 5-6 BM since last night). Keep titrate to have 3-4 BM Sandoval day. Continue Rifaximin 550 mg. - Wt 155 - 153 - 153.  700 cc urine output recorded in the last 24h. To increase urine output, change to Spironolactone 150 mg/ Lasix 60 mg PO daily BID. Continue compression stocking and encourage ambulation. Monitor BMP daily.  - Continue monitor I/O and daily wt.  - 1.2L fluid restriction. Low salt diet - Oxycodone 5 mg TID PRN for pain.  - Also order Echo to check for possible heart failure secondary to alcohol abuse. Results came back EF 60 -65%, normal R and L systolic function, and no abnormal valves.  - Plan is to monitor fluid status. Patient can be d/c when she is euvolemic and follow up with GI outpatient. Patient will need outpatient EGD  for varices evaluation    Liver mass - CT abd/pel (07/2019) showed moderate ascites, cirrhosis and portal hypertension, and indeterminate areas of low attenuation within the liver, possibly small liver masses.  - Follow up MRI of the abdomen shows focal area within the posterior RIGHT hepatic lobe suspicious for hepatic neoplasm. Imaging was limited due to ascites.  - AFP normal  - Triphasic hepatic CT (08/20/19) shows indistinct 2.4 cm segment 7 right liver mass without arterial hyperenhancement. Uncertain if neoplasm in nature. Patient will need Sandoval follow up MRI abdomen or CT w contrast in 3 to 6 months.  - Pending paracentesis cytology  Hypokalemia - K 3.1. Repleted with 40 Kdur x 2 doses.  - Follow up with daily BMP - Increasing to Spironolactone 150 mg and Lasix 60 mg PO daily BID.   Hyponatremia - Na 135 - 134 - 132 - 133 - Likely secondary to Lasix and Spironolactone - Continue to monitor daily BMP    Portal vein thrombosis - MRI abdomen shows minimal thrombus potentially in the LEFT portal vein - Patient is Sandoval poor candidate for anticoagulation   Pleural Effusion - CXR shows questionable LLL effusion - Continue to monitor I/O, daily wt - Follow 1.2L fluid restriction   Alcohol abuse - Blood Ethanol lv 313 on admission. She reports drinking 8 beers Sandoval day.  - Add Thiamine and Folate  - CIWA protocol without Ativan in place.    HTN - BP is in normal range - Continue to monitor   Diet: thin fluid IVF: NA CODE: Full DVT: Lovenox   Prior to Admission Living Arrangement: home Anticipated Discharge Location: home Barriers to Discharge: hypervolemia and ascites Dispo: Anticipated discharge in approximately 3 day(s).   Gaylan Gerold, DO 08/20/2019, 7:06 AM Pager: [phone] After 5pm on weekdays and 1pm on weekends: On Call pager [phone]

## 2019-08-21 LAB — COMPREHENSIVE METABOLIC PANEL
ALT: 28 U/L (ref 0–44)
AST: 121 U/L — ABNORMAL HIGH (ref 15–41)
Albumin: 1.9 g/dL — ABNORMAL LOW (ref 3.5–5.0)
Alkaline Phosphatase: 175 U/L — ABNORMAL HIGH (ref 38–126)
Anion gap: 8 (ref 5–15)
BUN: <5 mg/dL — ABNORMAL LOW (ref 6–20)
CO2: 26 mmol/L (ref 22–32)
Calcium: 7.7 mg/dL — ABNORMAL LOW (ref 8.9–10.3)
Chloride: 100 mmol/L (ref 98–111)
Creatinine, Ser: 0.45 mg/dL (ref 0.44–1.00)
GFR calc Af Amer: >60 mL/min (ref >60)
GFR calc non Af Amer: >60 mL/min (ref >60)
Glucose, Bld: 123 mg/dL — ABNORMAL HIGH (ref 70–99)
Potassium: 3.5 mmol/L (ref 3.5–5.1)
Sodium: 134 mmol/L — ABNORMAL LOW (ref 135–145)
Total Bilirubin: 6 mg/dL — ABNORMAL HIGH (ref 0.3–1.2)
Total Protein: 5.5 g/dL — ABNORMAL LOW (ref 6.5–8.1)

## 2019-08-21 LAB — PROTIME-INR
INR: 1.5 — ABNORMAL HIGH (ref 0.8–1.2)
Prothrombin Time: 17.5 seconds — ABNORMAL HIGH (ref 11.4–15.2)

## 2019-08-21 LAB — MAGNESIUM: Magnesium: 1.3 mg/dL — ABNORMAL LOW (ref 1.7–2.4)

## 2019-08-21 MED ORDER — LACTULOSE 10 GM/15ML PO SOLN
20.0000 g | Freq: Every day | ORAL | Status: DC
  Administered 2019-08-21 – 2019-08-23 (×3): 20 g via ORAL
  Filled 2019-08-21 (×3): qty 30

## 2019-08-21 MED ORDER — NALTREXONE HCL 50 MG PO TABS: 50.0000 mg | ORAL_TABLET | Freq: Every day | ORAL | Status: DC

## 2019-08-21 MED ORDER — ONDANSETRON HCL 4 MG/2ML IJ SOLN
4.0000 mg | Freq: Four times a day (QID) | INTRAMUSCULAR | Status: DC | PRN
  Administered 2019-08-21 – 2019-08-23 (×7): 4 mg via INTRAVENOUS
  Filled 2019-08-21 (×7): qty 2

## 2019-08-21 MED ORDER — MAGNESIUM SULFATE 2 GM/50ML IV SOLN
2.0000 g | Freq: Once | INTRAVENOUS | Status: AC
  Administered 2019-08-21: 2 g via INTRAVENOUS
  Filled 2019-08-21: qty 50

## 2019-08-21 NOTE — Progress Notes (Addendum)
Subjective: Kayla Sandoval reports improvement to her abdominal pain. She had 7 bowel movements with an optimal amount of urinary output. She stays with her mom in [place]. She states that her mom does not keep alcohol in her house. We did advice her to completely abstain from alcohol. Usually, she drinks at home. We also discussed medications that'll help decrease her craving for alcohol and she is agreeable.   Objective:  Vital signs in last 24 hours: Vitals:   08/20/19 1700 08/20/19 2038 08/20/19 2329 08/21/19 0428  BP: 105/63 102/71 122/84 111/65  Pulse: 84 88 86 87  Resp: 12 15 19 12   Temp: 98.5 F (36.9 C) 99.5 F (37.5 C) 98.6 F (37 C) 98.4 F (36.9 C)  TempSrc: Oral Oral Oral Oral  SpO2: 91% 94% 95% 94%  Weight:    67.3 kg  Height:        Physical Exam  Physical Exam Constitutional:      General: She is not in acute distress. HENT:     Head: Normocephalic.  Eyes:     General: No scleral icterus. Cardiovascular:     Rate and Rhythm: Normal rate and regular rhythm.     Heart sounds: Normal heart sounds.  Pulmonary:     Effort: Pulmonary effort is normal. No respiratory distress.  Abdominal:     General: Bowel sounds are normal. There is distension (improved).     Palpations: Abdomen is soft.     Tenderness: There is abdominal tenderness (mild diffuse tenderness).  Musculoskeletal:        General: Normal range of motion.     Right lower leg: Edema (+1) present.     Left lower leg: Edema (+1) present.  Skin:    General: Skin is warm.     Coloration: Skin is jaundiced.  Neurological:     Mental Status: She is alert.  Psychiatric:        Mood and Affect: Mood normal.     Assessment/Plan: Kayla Sandoval is a 49 yo WF with PMH of alcohol abuse, cirrhosis with prior history of cirrhosis, HTN, depression, anxiety, admitted to the hospital for decompensated liver cirrhosis. Making progress with diuresis.   Principal Problem:   Decompensated liver disease  ([place]) Active Problems:   Macrocytosis without anemia   MDD (major depressive disorder), recurrent severe, without psychosis ([place])   Alcohol abuse   Liver mass  Decompensated cirrhosis Likely alcoholic cirrhosis given history of alcohol abuse and AST/ALT ratio of 2. Hep C and Hep B negative. Patient has prior history cirrhosis and ascites in June but patient failed to follow up with PCP after discharge. CT abd/pel (07/2019) showed moderate ascites, cirrhosis and portal hypertension, and indeterminate areas of low attenuation within the liver, possibly small liver masses. RUQ [place] confirms cirrhosis, rules out gallstones and no biliary distention. Portal vein thrombosis detected. Patient does not need steroid given Maddrey's Discriminant score of 28.3.  MELD score of 23 (7-10% mortality in the next 3 months). She is not currently a candidate for liver transplant due to ongoing alcohol abuse. Paracentesis consistent with ascites secondary to portal hypertension SAAG score > 1.1. Echo to check for possible heart failure secondary to alcohol abuse. Results came back EF 60 -65%, normal R and L systolic function, and no abnormal valves.   - LFT slowly improving. Total bil trending down 10.8 - 7.6 - 7.0 - 6. INR 1.5. Patient may have subclinical encephalopathy, which placing her towards acute hepatic failure.  -  Decrease Lactulose to 20 g once daily (patient have 7 BM since last night). Keep titrate to have 3-4 BM a day. Continue Rifaximin 550 mg. - Wt 155 - 153 - 153 - 148. We are making good progress with fluid status. Continue Spironolactone 150 mg/ Lasix 60 mg PO daily BID. Continue compression stocking and encourage ambulation. Monitor BMP daily.  - Continue monitor I/O and daily wt.  - 1.2L fluid restriction. Low salt diet - Oxycodone 5 mg TID PRN for pain.  - Plan is to monitor fluid status. Patient can be d/c when she is euvolemic and follow up with GI outpatient. Patient will need outpatient EGD for  varices evaluation  - Patient is in the process of getting Medicaid. In the mean time, will set up continuity appointment at the Internal medicine clinic or [facility] until patient can obtain medicaid.   Liver mass CT abd/pel (07/2019) showed moderate ascites, cirrhosis and portal hypertension, and indeterminate areas of low attenuation within the liver, possibly small liver masses. Follow up MRI of the abdomen shows focal area within the posterior RIGHT hepatic lobe suspicious for hepatic neoplasm. Imaging was limited due to ascites.   - AFP normal  - Triphasic hepatic CT (08/20/19) shows indistinct 2.4 cm segment 7 right liver mass without arterial hyperenhancement. Intermediate risk for  neoplasm. Patient will need a follow up MRI abdomen or CT w contrast in 3 to 6 months.    Hypokalemia - K 3.5. Marland Kitchen  - Follow up with daily BMP - Continue Kdur 20 mEq BID - Continue Spironolactone 150 mg and Lasix 60 mg PO daily BID.   Hyponatremia - Na 135 - 134 - 132 - 133 - 134 - Likely secondary to cirrhosis and diuretics - Continue to monitor daily BMP    Portal vein thrombosis - MRI abdomen shows minimal thrombus potentially in the LEFT portal vein - Patient is a poor candidate for anticoagulation   Pleural Effusion - CXR shows questionable LLL effusion - Continue to monitor I/O, daily wt - Follow 1.2L fluid restriction   Alcohol abuse - Blood Ethanol lv 313 on admission. She reports drinking 8 beers a day.  - Add Thiamine and Folate  - CIWA protocol without Ativan in place.  - Advise patient to completely abstain from alcohol. Naltrexone is contraindicated for decompensated liver cirrhosis.    HTN - BP is in normal range - Continue to monitor   Diet: thin fluid IVF: NA CODE: Full DVT: Lovenox   Prior to Admission Living Arrangement: home Anticipated Discharge Location: home Barriers to Discharge: hypervolemia and ascites Dispo: Anticipated discharge in  approximately 3 day(s).   Gaylan Gerold, DO 08/21/2019, 7:57 AM Pager: [phone] After 5pm on weekdays and 1pm on weekends: On Call pager [phone]

## 2019-08-22 ENCOUNTER — Telehealth: Payer: Self-pay | Admitting: Nurse Practitioner

## 2019-08-22 LAB — CBC
HCT: 40.1 % (ref 36.0–46.0)
Hemoglobin: 13.5 g/dL (ref 12.0–15.0)
MCH: 36.9 pg — ABNORMAL HIGH (ref 26.0–34.0)
MCHC: 33.7 g/dL (ref 30.0–36.0)
MCV: 109.6 fL — ABNORMAL HIGH (ref 80.0–100.0)
Platelets: 114 10*3/uL — ABNORMAL LOW (ref 150–400)
RBC: 3.66 MIL/uL — ABNORMAL LOW (ref 3.87–5.11)
RDW: 13.8 % (ref 11.5–15.5)
WBC: 6.7 10*3/uL (ref 4.0–10.5)
nRBC: 0 % (ref 0.0–0.2)

## 2019-08-22 LAB — COMPREHENSIVE METABOLIC PANEL
ALT: 34 U/L (ref 0–44)
AST: 124 U/L — ABNORMAL HIGH (ref 15–41)
Albumin: 2.4 g/dL — ABNORMAL LOW (ref 3.5–5.0)
Alkaline Phosphatase: 203 U/L — ABNORMAL HIGH (ref 38–126)
Anion gap: 9 (ref 5–15)
BUN: <5 mg/dL — ABNORMAL LOW (ref 6–20)
CO2: 27 mmol/L (ref 22–32)
Calcium: 8.2 mg/dL — ABNORMAL LOW (ref 8.9–10.3)
Chloride: 95 mmol/L — ABNORMAL LOW (ref 98–111)
Creatinine, Ser: 0.59 mg/dL (ref 0.44–1.00)
GFR calc Af Amer: >60 mL/min (ref >60)
GFR calc non Af Amer: >60 mL/min (ref >60)
Glucose, Bld: 100 mg/dL — ABNORMAL HIGH (ref 70–99)
Potassium: 3.2 mmol/L — ABNORMAL LOW (ref 3.5–5.1)
Sodium: 131 mmol/L — ABNORMAL LOW (ref 135–145)
Total Bilirubin: 8.5 mg/dL — ABNORMAL HIGH (ref 0.3–1.2)
Total Protein: 6.8 g/dL (ref 6.5–8.1)

## 2019-08-22 LAB — BASIC METABOLIC PANEL
Anion gap: 8 (ref 5–15)
BUN: <5 mg/dL — ABNORMAL LOW (ref 6–20)
CO2: 24 mmol/L (ref 22–32)
Calcium: 7.8 mg/dL — ABNORMAL LOW (ref 8.9–10.3)
Chloride: 99 mmol/L (ref 98–111)
Creatinine, Ser: 0.49 mg/dL (ref 0.44–1.00)
GFR calc Af Amer: >60 mL/min (ref >60)
GFR calc non Af Amer: >60 mL/min (ref >60)
Glucose, Bld: 107 mg/dL — ABNORMAL HIGH (ref 70–99)
Potassium: 3.4 mmol/L — ABNORMAL LOW (ref 3.5–5.1)
Sodium: 131 mmol/L — ABNORMAL LOW (ref 135–145)

## 2019-08-22 LAB — PROTIME-INR
INR: 1.4 — ABNORMAL HIGH (ref 0.8–1.2)
Prothrombin Time: 16.9 seconds — ABNORMAL HIGH (ref 11.4–15.2)

## 2019-08-22 LAB — MAGNESIUM
Magnesium: 1.2 mg/dL — ABNORMAL LOW (ref 1.7–2.4)
Magnesium: 2 mg/dL (ref 1.7–2.4)

## 2019-08-22 MED ORDER — POTASSIUM CHLORIDE CRYS ER 20 MEQ PO TBCR
40.0000 meq | EXTENDED_RELEASE_TABLET | Freq: Two times a day (BID) | ORAL | Status: AC
  Administered 2019-08-22 (×2): 40 meq via ORAL
  Filled 2019-08-22 (×2): qty 2

## 2019-08-22 MED ORDER — MAGNESIUM SULFATE 2 GM/50ML IV SOLN: 2.0000 g | Freq: Once | INTRAVENOUS | Status: DC

## 2019-08-22 MED ORDER — MAGNESIUM SULFATE 4 GM/100ML IV SOLN
4.0000 g | Freq: Once | INTRAVENOUS | Status: AC
  Administered 2019-08-22: 4 g via INTRAVENOUS
  Filled 2019-08-22: qty 100

## 2019-08-22 NOTE — TOC Initial Note (Signed)
Transition of Care [facility]) - Initial/Assessment Note    Patient Details  Name: Kayla Sandoval MRN: [id number] Date of Birth: [birth year]  [facility]) CM/SW Contact:    Verdell Carmine, RN Phone Number: 08/22/2019, 5:35 PM  Clinical Narrative:                 Admitted for ETOH cirrhosis, making progress with active diuresis. Medicaid is pending, will likely need [facility] pharmacy initially for medications upon D/C then follow up with community health and wellness, already listed in patient instructions. Will continue to follow for further needs for discharge  Expected Discharge Plan: Home/Self Care Barriers to Discharge: Continued Medical Work up   Patient Goals and CMS Choice        Expected Discharge Plan and Services Expected Discharge Plan: Home/Self Care                                              Prior Living Arrangements/Services                       Activities of Daily Living      Permission Sought/Granted                  Emotional Assessment       Orientation: : Oriented to Self, Oriented to Place, Oriented to  Time, Oriented to Situation Alcohol / Substance Use: Alcohol Use Psych Involvement: No (comment)  Admission diagnosis:  Ascites [R18.8] Decompensated liver disease ([place]) [K74.69] Liver failure without hepatic coma, unspecified chronicity ([place]) [K72.90] Patient Active Problem List   Diagnosis Date Noted  . Decompensated liver disease ([place]) 08/16/2019  . Alcohol abuse 08/16/2019  . Liver mass 08/16/2019  . Alcohol use disorder, moderate, dependence ([place]) 02/15/2016  . Substance-induced psychotic disorder with hallucinations ([place]) 02/15/2016  . History of head injury 02/15/2016  . History of spinal fracture 02/15/2016  . Feeling grief   . MDD (major depressive disorder), recurrent severe, without psychosis ([place]) 04/08/2015  . Acute stress disorder 04/08/2015  . Insomnia 04/08/2015  . Recent  bereavement 04/07/2015  . Alcohol withdrawal ([place]) 04/06/2015  . CAP (community acquired pneumonia) 04/03/2015  . Overdose 04/03/2015  . Acute encephalopathy 04/03/2015  . Acute respiratory failure with hypoxia ([place]) 04/03/2015  . Alcohol intoxication ([place]) 04/03/2015  . Tobacco use disorder 04/03/2015  . Hemothorax on left 04/09/2011  . Pleural effusion 03/27/2011  . Pneumonia 03/27/2011  . Tobacco abuse 03/27/2011  . Hypertension 03/27/2011  . Macrocytosis without anemia 03/27/2011  . Depression 03/27/2011   PCP:  Patient, No Pcp Per Pharmacy:   [facility], [place] [address] [place] [address] Phone: [phone] Fax: [phone]     Social Determinants of Health (SDOH) Interventions    Readmission Risk Interventions No flowsheet data found.

## 2019-08-22 NOTE — Progress Notes (Addendum)
Subjective: HD#6   Overnight: No acute events reported  Today, Kayla Sandoval is very impressed with the amount of progress she is made since admission.  She is able to see her legs now without much food whatsoever.  She is tolerating her diet without any difficulties.  She continues to make urine and had about 8 urine output yesterday.  Objective:  Vital signs in last 24 hours: Vitals:   08/21/19 2004 08/22/19 0039 08/22/19 0420 08/22/19 0500  BP: 99/77 106/65 103/74   Pulse: 96 89 88   Resp: 15 12 17 12   Temp: 98.4 F (36.9 C) 98.6 F (37 C) 98.7 F (37.1 C)   TempSrc: Oral Oral Oral   SpO2: 99% 95% 96%   Weight:    66.4 kg  Height:       Const: In no apparent distress CV: RRR, no murmurs, gallop, rub Abd: Bowel sounds present, mildly distended, nontender to palpation Ext: No lower extremity edema, skin is warm to touch Skin: Spider angiomata located at the anterior chest  Assessment/Plan:  Principal Problem:   Decompensated liver disease ([facility]) Active Problems:   Macrocytosis without anemia   MDD (major depressive disorder), recurrent severe, without psychosis ([place])   Alcohol abuse   Liver mass  Kayla A Treacyis a 49 yo WF with PMH of alcohol abuse, cirrhosis with prior history of cirrhosis, HTN, depression, anxiety, admitted to the hospital fordecompensated liver cirrhosis. Making progress with diuresis.   Decompensatedcirrhosis Likely alcoholic cirrhosis given history of alcohol abuse and AST/ALT ratio of 2. Hep C and Hep B negative.Patient has prior history cirrhosis and ascites in June but patient failed to follow up with PCP after discharge. CT abd/pel (07/2019) showed moderate ascites,cirrhosis and portal hypertension, and indeterminate areas of low attenuation within the liver, possibly small liver masses.RUQ [place] confirms cirrhosis, rules out gallstones and no biliary distention. Portal vein thrombosis detected.Patient does not need steroid given  Maddrey's Discriminant score of 28.3.MELD score of23(7-10% mortality in the next 3 months). Sheisnot currentlya candidate for liver transplant due to ongoing alcohol abuse.Paracentesis consistent withascites secondary to portal hypertension SAAG score > 1.1.Echo to check for possible heart failure secondary to alcohol abuse. Results came back EF 60 -65%, normal R and L systolic function, and no abnormal valves.   - Continue lactulose 20 g daily, rifaximin 550 mg daily - Continue Spironolactone150 mg/Lasix60 mgPO dailyBID. - Continue compression stocking and encourage ambulation. - 1.2L fluid restriction. Low salt diet - Oxycodone 5 mg TID PRN for pain. - Follow-up GI outpatient - She will follow up with [place] in the clinic after discharge at least in the interim until she establish with a PCP in [place]  Liver mass CT abd/pel (07/2019) showed moderate ascites,cirrhosis and portal hypertension, and indeterminate areas of low attenuation within the liver, possibly small liver masses.Follow upMRI of the abdomenshows focal area within the posterior RIGHT hepatic lobe suspicious for hepatic neoplasm. Imaging was limited due to ascites.   - AFPnormal  -Triphasic hepatic CT (08/20/19) shows indistinct 2.4 cm segment 7 right liver mass without arterial hyperenhancement. Intermediate risk for  neoplasm. Patient will need a follow up MRI abdomen or CT w contrast in 3 to 6 months.   Hypokalemia - K3.2 - Follow up withdailyBMP - Continue K-Dur 40 mEq twice daily x4 doses -ContinueSpironolactone150 mgandLasix60 mgPO dailyBID.  Hyponatremia-stable - Likely secondary to cirrhosis and diuretics - Continue to monitor daily BMP  Portal vein thrombosis -MRI abdomen showsminimal thrombus potentially in the  LEFT portal vein - Patient is a poor candidate for anticoagulation  Pleural Effusion - CXR shows questionable LLL effusion -Continue to monitor I/O, daily  wt - Follow 1.2L fluid restriction  Alcohol abuse -Blood Ethanol lv 313on admission. She reports drinking 8 beers a day. - Continue thiamine and Folate  - CIWA protocol without Ativan in place.  - Advise patient to completely abstain from alcohol. Naltrexone is contraindicated for decompensated liver cirrhosis.   HTN - BP is in normal range - Continue to monitor  Diet: thin fluid IVF: NA CODE:Full DVT: Lovenox  Prior to Admission Living Arrangement:home Anticipated Discharge Location:home Barriers to Discharge:hypervolemia and ascites Dispo: Anticipated discharge in approximately3day(s).   Jean Rosenthal, MD 08/22/2019, 6:40 AM Pager: [phone] Internal Medicine Teaching Service After 5pm on weekdays and 1pm on weekends: On Call pager: [phone]

## 2019-08-22 NOTE — Progress Notes (Signed)
Plan of care reviewed. Pt's been progressing. She's hemodynamically stable. Alert and oriented x 4. CIWA score 0-1. Remained afebrile. She stated her abdominal pain scale 8-9/10 and felt nausea. Zofran IV given. Her symptom relieved after med administration. Oxycodone given PRN, Pt tolerated well and able to rest well tonight. NSR on monitor, HR 70s-80s, BP within normal limits, RR 12-15, SPO2 95% on room air. No immediate distress noted tonight. We will continue to monitor.  Kennyth Lose, RN

## 2019-08-22 NOTE — Telephone Encounter (Signed)
Kelly, pls contact patient to schedule a follow up appointment with Dr. Lyndel Safe, [facility] [place] or with me in 4 to 6 weeks. Pls provide patient with a lab order to have a CBC, BMP, Hepatic panel and PT/INR done in 2 weeks. Pls enter a triphasic liver CT for 3 months.  Pt to be discharged from [facility] most likely today or tomorrow. thx

## 2019-08-23 ENCOUNTER — Telehealth: Payer: Self-pay | Admitting: Internal Medicine

## 2019-08-23 ENCOUNTER — Other Ambulatory Visit: Payer: Self-pay | Admitting: Gastroenterology

## 2019-08-23 DIAGNOSIS — K7031 Alcoholic cirrhosis of liver with ascites: Secondary | ICD-10-CM

## 2019-08-23 MED ORDER — ONDANSETRON HCL 4 MG PO TABS: 4.0000 mg | ORAL_TABLET | Freq: Two times a day (BID) | ORAL | 0 refills | Status: AC | PRN

## 2019-08-23 MED ORDER — LACTULOSE 10 GM/15ML PO SOLN: 20.0000 g | Freq: Two times a day (BID) | ORAL | Status: DC

## 2019-08-23 MED ORDER — RIFAXIMIN 550 MG PO TABS: 550.0000 mg | ORAL_TABLET | Freq: Two times a day (BID) | ORAL | 0 refills | Status: DC

## 2019-08-23 MED ORDER — SPIRONOLACTONE 50 MG PO TABS: 150.0000 mg | ORAL_TABLET | Freq: Two times a day (BID) | ORAL | 0 refills | Status: DC

## 2019-08-23 MED ORDER — PANTOPRAZOLE SODIUM 40 MG PO TBEC: 40.0000 mg | DELAYED_RELEASE_TABLET | Freq: Every day | ORAL | 0 refills | Status: DC

## 2019-08-23 MED ORDER — FUROSEMIDE 20 MG PO TABS: 60.0000 mg | ORAL_TABLET | Freq: Two times a day (BID) | ORAL | 0 refills | Status: DC

## 2019-08-23 MED ORDER — PANTOPRAZOLE SODIUM 40 MG PO TBEC
40.0000 mg | DELAYED_RELEASE_TABLET | Freq: Every day | ORAL | Status: DC
  Administered 2019-08-23: 40 mg via ORAL
  Filled 2019-08-23: qty 1

## 2019-08-23 MED ORDER — OXYCODONE HCL 5 MG PO TABS: 5.0000 mg | ORAL_TABLET | Freq: Three times a day (TID) | ORAL | 0 refills | Status: AC | PRN

## 2019-08-23 MED ORDER — LACTULOSE 10 GM/15ML PO SOLN: 20.0000 g | Freq: Two times a day (BID) | ORAL | 0 refills | Status: DC

## 2019-08-23 MED FILL — PANTOPRAZOLE SOD DR 40 MG T: 40 | 30 days supply | Qty: 30 | Fill #0

## 2019-08-23 MED FILL — oxyCODONE HCL 5 MG TABS: 5 | 5 days supply | Qty: 15 | Fill #0

## 2019-08-23 MED FILL — FUROSEMIDE 20 MG TAB: 20 | 30 days supply | Qty: 180 | Fill #0

## 2019-08-23 MED FILL — ONDANSETRON HCL 4 MG TABLET: 4 | 14 days supply | Qty: 28 | Fill #0

## 2019-08-23 MED FILL — SPIRONOLACTONE 50 MG TAB: 50 | 30 days supply | Qty: 180 | Fill #0

## 2019-08-23 MED FILL — LACTULOSE 10 GM/15 ML SOLN: 10 | 30 days supply | Qty: 1892 | Fill #0

## 2019-08-23 NOTE — Discharge Summary (Addendum)
Name: Kayla Sandoval MRN: [id number] DOB: 09/13/1970 49 y.o. PCP: Patient, No Pcp Per  Date of Admission: 08/16/2019 11:12 AM Date of Discharge: 08/23/19 Attending Physician: Oda Kilts, MD  Discharge Diagnosis: 1. Decompensated liver cirrhosis  2. Liver mass 3. Pleural effusion 4. Alcohol abuse  Discharge Medications: Allergies as of 08/23/2019   No Known Allergies      Medication List     STOP taking these medications    acetaminophen 500 MG tablet Commonly known as: TYLENOL   azithromycin 250 MG tablet Commonly known as: Zithromax Z-Pak   cyclobenzaprine 10 MG tablet Commonly known as: FLEXERIL   hydrocortisone 2.5 % lotion   ibuprofen 800 MG tablet Commonly known as: ADVIL   lidocaine 5 % Commonly known as: Lidoderm   lisinopril 10 MG tablet Commonly known as: ZESTRIL   naproxen 500 MG tablet Commonly known as: NAPROSYN   pseudoephedrine 120 MG 12 hr tablet Commonly known as: Sudafed 12 Hour   traMADol 50 MG tablet Commonly known as: ULTRAM       TAKE these medications    citalopram 10 MG tablet Commonly known as: CELEXA Take 1 tablet (10 mg total) by mouth daily.   furosemide 20 MG tablet Commonly known as: LASIX Take 3 tablets (60 mg total) by mouth 2 (two) times daily. What changed:  how much to take when to take this   hydrOXYzine 25 MG tablet Commonly known as: ATARAX/VISTARIL Take 1 tablet (25 mg total) by mouth every 6 (six) hours as needed (anxiety/agitation or CIWA < or = 10).   lactulose 10 GM/15ML solution Commonly known as: CHRONULAC Take 30 mLs (20 g total) by mouth 2 (two) times daily.   Melatonin 10 MG Tabs Take 10 mg by mouth at bedtime as needed (sleep).   nicotine 14 mg/24hr patch Commonly known as: NICODERM CQ - dosed in mg/24 hours Place 1 patch (14 mg total) onto the skin daily.   oxyCODONE 5 MG immediate release tablet Commonly known as: Oxy IR/ROXICODONE Take 1 tablet (5 mg total) by mouth 3  (three) times daily as needed for up to 5 days for severe pain or breakthrough pain.   pantoprazole 40 MG tablet Commonly known as: PROTONIX Take 1 tablet (40 mg total) by mouth daily.   Probiotic 250 MG Caps Take 1 capsule by mouth daily.   QUEtiapine 25 MG tablet Commonly known as: SEROQUEL Take 1 tablet (25 mg total) by mouth at bedtime.   rifaximin 550 MG Tabs tablet Commonly known as: XIFAXAN Take 1 tablet (550 mg total) by mouth 2 (two) times daily.   spironolactone 50 MG tablet Commonly known as: ALDACTONE Take 3 tablets (150 mg total) by mouth 2 (two) times daily.   traZODone 50 MG tablet Commonly known as: DESYREL Take 1 tablet (50 mg total) by mouth at bedtime as needed for sleep.        Disposition and follow-up:   Kayla Sandoval was discharged from [facility] in Stable condition.  At the hospital follow up visit please address:  1.   Decompensated liver disease - Follow up with Dr. Lyndel Safe in 4-6 weeks. Provide lab order for CBC, BMP, Hepatic panel and PT/INR in 2 weeks.  Liver mass - Order a triphasic liver CT for 3 months  Pleural effusion - Please evaluate with CXR or [place]   Alcohol abuse - Advised to completely abstain from drinking - Provide resources such as AA   2.  Labs / imaging needed at time of follow-up: CBC, BMP, Hepatic panel and PT/INR before seeing GI.  3.  Pending labs/ test needing follow-up: none  Follow-up Appointments:  Follow-up Information     [facility]. Go on 09/15/2019.   Why: '@8' :50am for hospital followup primary care, pharmacy, and finacial councellling Contact information: [place] [id number] [phone]                Hospital Course by problem list: 1.  Decompensated cirrhosis Likely alcoholic cirrhosis given history of alcohol abuse and AST/ALT ratio of 2. Hep C and Hep B negative. CT abd/pel (07/2019) showed moderate  ascites, cirrhosis and portal hypertension, and indeterminate areas of low attenuation within the liver, possibly small liver masses. RUQ [place] confirms cirrhosis, rules out gallstones and no biliary distention. Portal vein thrombosis detected. Patient does not need steroid given Maddrey's Discriminant score of 28.3. MELD score of 23 (7-10% mortality in the next 3 months). She is not currently a candidate for liver transplant due to ongoing alcohol abuse. Paracentesis consistent with ascites secondary to portal hypertension SAAG score > 1.1. Echo to check for possible heart failure secondary to alcohol abuse. Results came back EF 60 -65%, normal R and L systolic function, and no abnormal valves. She was started on Lactulose and Rifaximin, which she will continue to take at home. Vitamin K was also given for 5 days. She was effectively diuresed with Spironolactone 150 mg/ Lasix 60 mg BID and she will continue taking these at home. Patient will follow up with the Internal Medicine clinic in a week. She will also follow up with the GI office for outpatient EGD and CT.   Liver mass CT abd/pel (07/2019) showed moderate ascites, cirrhosis and portal hypertension, and indeterminate areas of low attenuation within the liver, possibly small liver masses. Follow up MRI of the abdomen shows focal area within the posterior RIGHT hepatic lobe suspicious for hepatic neoplasm. Imaging was limited due to ascites.Triphasic hepatic CT (08/20/19) shows indistinct 2.4 cm segment 7 right liver mass without arterial hyperenhancement. Intermediate risk for  neoplasm. Patient will need a follow up CT in 3 months.   Pleural Effusion CXR shows mild LLL effusion. Patient was diuresed with Spironolactone 150 mg/ Lasix 60 mg BID. She will follow up with the clinic outpatient. If effusion is still present, would recommend thoracentesis.   Alcohol abuse Blood Ethanol lv 313 on admission. Thiamine and Folate were added. CIWA protocol without  Ativan in place. Patient was advised to completely abstain from alcohol.    Discharge Vitals:   BP 102/70 (BP Location: Left Arm)   Pulse 73   Temp 98.2 F (36.8 C) (Oral)   Resp 15   Ht '5\' 4"'  (1.626 m)   Wt 64.4 kg   SpO2 97%   BMI 24.36 kg/m   Pertinent Labs, Studies, and Procedures:   CBC Latest Ref Rng & Units 08/22/2019 08/20/2019 08/19/2019  WBC 4.0 - 10.5 K/uL 6.7 5.2 4.7  Hemoglobin 12.0 - 15.0 g/dL 13.5 13.2 12.8  Hematocrit 36 - 46 % 40.1 38.4 38.0  Platelets 150 - 400 K/uL 114(L) 91(L) 94(L)   BMP Latest Ref Rng & Units 08/22/2019 08/22/2019 08/21/2019  Glucose 70 - 99 mg/dL 107(H) 100(H) 123(H)  BUN 6 - 20 mg/dL <5(L) <5(L) <5(L)  Creatinine 0.44 - 1.00 mg/dL 0.49 0.59 0.45  Sodium 135 - 145 mmol/L 131(L) 131(L) 134(L)  Potassium 3.5 - 5.1 mmol/L  3.4(L) 3.2(L) 3.5  Chloride 98 - 111 mmol/L 99 95(L) 100  CO2 22 - 32 mmol/L '24 27 26  ' Calcium 8.9 - 10.3 mg/dL 7.8(L) 8.2(L) 7.7(L)   Hepatic Function Latest Ref Rng & Units 08/22/2019 08/21/2019 08/20/2019  Total Protein 6.5 - 8.1 g/dL 6.8 5.5(L) 6.4(L)  Albumin 3.5 - 5.0 g/dL 2.4(L) 1.9(L) 2.2(L)  AST 15 - 41 U/L 124(H) 121(H) 144(H)  ALT 0 - 44 U/L 34 28 31  Alk Phosphatase 38 - 126 U/L 203(H) 175(H) 214(H)  Total Bilirubin 0.3 - 1.2 mg/dL 8.5(H) 6.0(H) 7.0(H)  Bilirubin, Direct 0.0 - 0.2 mg/dL - - 3.5(H)    MR ABDOMEN W WO CONTRAST  Result Date: 08/17/2019 CLINICAL DATA:  Evaluation of liver mass in a patient with history of cirrhosis. Abnormality discovered on abdominal sonogram. EXAM: MRI ABDOMEN WITHOUT AND WITH CONTRAST TECHNIQUE: Multiplanar multisequence MR imaging of the abdomen was performed both before and after the administration of intravenous contrast. CONTRAST:  7.42m GADAVIST GADOBUTROL 1 MMOL/ML IV SOLN COMPARISON:  Abdominal sonogram August 17, 2019 FINDINGS: Lower chest: Small LEFT pleural effusion. No dense consolidation. Limited assessment on MRI. Hepatobiliary: Liver displays cirrhotic morphology. In the LEFT  portal vein there is irregularity along the anterior wall (image 49 of series 804) potentially related to small amount of thrombus. Main portal vein is patent as is the SMV. The splenic vein is patent. Portosystemic collaterals in the upper abdomen. Cirrhotic and steatotic nodules. Lesion in the LEFT hepatic lobe displays very little enhancement mainly at the periphery in this is best seen on coronal imaging. Lesion displayed characteristic features of hemangioma on a previous imaging study and displays slightly increased T2 signal which is limited on today's assessment in terms of T2 evaluation in the setting of diffuse abdominal ascites. Vague area of abnormality in the posterior RIGHT hepatic lobe 2.5 x 2.1 cm on the axial dataset. Note that the imaging is acquired in venous phase rather than late arterial. Perhaps patient condition limiting bolus timing. This is also seen on coronal imaging. Best displayed on image 45 of series 9. No additional lesion aside from multiple nodular areas within the liver. Gallbladder with mild distension. Sludge in the lumen. Mild wall thickening. No specific signs of cholecystitis however. Pancreas: Pancreas without signs of peripancreatic stranding beyond the amount of edema that is seen elsewhere in the abdomen in the setting of portal hypertension. Spleen: Spleen enlarged approximately 13-14 cm greatest craniocaudal dimension. Adrenals/Urinary Tract: Normal adrenal glands with smooth renal contours. No suspicious renal lesion. Stomach/Bowel: Diffuse bowel edema in the setting of portal hypertension. RIGHT colon is particularly thickened. Vascular/Lymphatic: Patent abdominal vasculature. Question small thrombus in LEFT portal venous branch as discussed. Other:  Volume of ascites is similar to recent CT from July 23, 2019 Musculoskeletal: No suspicious bone lesions identified. IMPRESSION: 1. Study limited by abdominal ascites. Focal area within the posterior RIGHT hepatic lobe  suspicious for hepatic neoplasm. Given lack of good late arterial phase imaging features suggest either LI-RADS 5 or LR M lesion. Would suggest short interval follow-up with CT as the amount of ascites limits assessment at 3 tesla in this patient. The liver is overall heterogeneous and there may be other lesions that could be missed given lack of late arterial phase. 2. Lesion in the LEFT hepatic lobe displays characteristic features of a hemangioma likely partially sclerosed based on comparison with previous imaging. 3. Cirrhosis with stigmata of portal hypertension including splenomegaly and varices. 4. RIGHT colonic thickening greater than  other areas of bowel edema may represent portal colopathy and changes of portal hypertension. 5. Question portal venous thrombus now with only minimal thrombus potentially in the LEFT portal vein. SMV and main portal vein appear patent. Perhaps very slow flow within the portal vein cause lack of color Doppler due to technical factors on previous ultrasound. 6. Sludge in the gallbladder with mild wall thickening. No specific signs of cholecystitis in this patient with portal hypertension and cirrhosis. Gallbladder with unchanged appearance dating back to June 11th. 7. Small LEFT pleural effusion. Electronically Signed   By: Zetta Bills M.D.   On: 08/17/2019 19:51   DG Chest Port 1 View  Result Date: 08/16/2019 CLINICAL DATA:  Shortness of breath with lower extremity edema EXAM: PORTABLE CHEST 1 VIEW COMPARISON:  July 23, 2019 FINDINGS: Trachea midline. Cardiomediastinal contours and hilar structures are normal. Lungs are clear. Mild blunting of LEFT costodiaphragmatic sulcus. Signs of prior trauma to RIGHT fifth and sixth ribs posteriorly. No dense consolidation. No acute process with respect to visualized skeletal structures on limited assessment. IMPRESSION: Question trace LEFT pleural effusion, no acute finding otherwise in the chest. Electronically Signed   By:  Zetta Bills M.D.   On: 08/16/2019 12:32   US Abdomen Limited RUQ  Result Date: 08/17/2019 CLINICAL DATA:  Ascites, post paracentesis.  Cirrhosis. EXAM: ULTRASOUND ABDOMEN LIMITED RIGHT UPPER QUADRANT COMPARISON:  CT 07/23/2019. FINDINGS: Gallbladder: Gallbladder wall is thickened at 4.3 mm. This may be secondary to hypoproteinemia. Cholecystitis cannot be excluded. No gallstones. Common bile duct: Diameter: 4.9 mm Liver: Heterogeneous hepatic echotexture with slightly nodular contour consistent patient's known cirrhosis. Portal vein appears thrombosed. No color flow noted. Other: Minimal residual ascites. IMPRESSION: 1. Gallbladder wall thickened at 4.3 mm. This may be secondary to hypoproteinemia. Cholecystitis cannot be excluded. No gallstones noted. No biliary distention. 2. Heterogeneous hepatic echotexture with slightly irregular contour consistent patient's known cirrhosis 3.  Portal vein thrombosis. Electronically Signed   By: Marcello Moores  Register   On: 08/17/2019 11:57   IR Paracentesis  Result Date: 08/17/2019 INDICATION: Patient history of alcoholic cirrhosis with abdominal distension. Team is requesting therapeutic and diagnostic paracentesis EXAM: ULTRASOUND GUIDED THERAPEUTIC AND DIAGNOSTIC PARACENTESIS MEDICATIONS: Lidocaine 1% 10 mL COMPLICATIONS: None immediate. PROCEDURE: Informed written consent was obtained from the patient after a discussion of the risks, benefits and alternatives to treatment. A timeout was performed prior to the initiation of the procedure. Initial ultrasound scanning demonstrates a moderate amount of ascites within the right lower abdominal quadrant. The right lower abdomen was prepped and draped in the usual sterile fashion. 1% lidocaine was used for local anesthesia. Following this, a 6 Fr Safe-T-Centesis catheter was introduced. An ultrasound image was saved for documentation purposes. The paracentesis was performed. The catheter was removed and a dressing was applied.  The patient tolerated the procedure well without immediate post procedural complication. FINDINGS: A total of approximately 3.7 L of straw-colored fluid was removed. Samples were sent to the laboratory as requested by the clinical team. IMPRESSION: Successful ultrasound-guided therapeutic and diagnostic paracentesis yielding 3.7 liters of peritoneal fluid. Read by: Rushie Nyhan, NP Electronically Signed   By: Lucrezia Europe M.D.   On: 08/17/2019 10:32  Discharge Instructions: Discharge Instructions     Diet - low sodium heart healthy   Complete by: As directed    Discharge instructions   Complete by: As directed    Ms. Josten,   It was a pleasure taking care of you in the hospital. You were  admitted for volume overload because of alcohol cirrhosis. We did a paracentesis and started you on medications to get rid of some of the fluids.   Please follow up with the GI doctor in 4-6 weeks.  You will also follow up with [place] in the clinic as well.   Take Care!   Increase activity slowly   Complete by: As directed    No wound care   Complete by: As directed        Signed: Gaylan Gerold, DO 08/23/2019, 11:59 AM   Pager: [phone]

## 2019-08-23 NOTE — Plan of Care (Signed)

## 2019-08-23 NOTE — Progress Notes (Addendum)
Subjective:   Hospital day: 7  Overnight event: No  She reports that she had urinated about 8 times yesterday and had one bowel movement. She is willing to follow up with [place] in the clinic soon after discharge. We also stressed the importance of abstaining from alcohol to help her long term mobidity and mortality. In regards to the abdominal pain, she reports that the pain medication numbs it. We also discussed resources to help with her chronic abdominal pain and made her aware that we'll have to only discharge her on a short course of opioid pain medication.   Objective:  Vital signs in last 24 hours: Vitals:   08/22/19 1944 08/22/19 2046 08/22/19 2348 08/23/19 0455  BP: 102/71  96/68 103/69  Pulse: 85  84 82  Resp: 19 (!) 22 20 15   Temp: 98.7 F (37.1 C)  98.5 F (36.9 C) 98.4 F (36.9 C)  TempSrc: Oral  Oral Oral  SpO2: 97%  96% 96%  Weight:    64.4 kg  Height:        Physical Exam  Physical Exam Constitutional:      General: She is not in acute distress. Eyes:     General: Scleral icterus present.  Cardiovascular:     Rate and Rhythm: Normal rate and regular rhythm.     Heart sounds: Normal heart sounds.  Pulmonary:     Effort: Pulmonary effort is normal. No respiratory distress.  Abdominal:     General: There is distension (much improved).     Palpations: Abdomen is soft.     Tenderness: There is abdominal tenderness (diffuse tenderness to palpation). There is no guarding.  Musculoskeletal:        General: Normal range of motion.     Cervical back: Normal range of motion.     Right lower leg: Edema (trace) present.     Left lower leg: Edema (trace) present.  Skin:    General: Skin is warm.     Coloration: Skin is jaundiced (improved).  Neurological:     Mental Status: She is alert.  Psychiatric:        Mood and Affect: Mood normal.     Assessment/Plan: Kayla A Treacyis a 50 yo WF with PMH of alcohol abuse, cirrhosis with prior history of cirrhosis,  HTN, depression, anxiety, admitted to the hospital fordecompensated liver cirrhosis.Making progress with diuresis and can be discharged home.  Principal Problem:   Decompensated liver disease ([place]) Active Problems:   Macrocytosis without anemia   MDD (major depressive disorder), recurrent severe, without psychosis ([place])   Alcohol abuse   Liver mass  Decompensatedcirrhosis Likely alcoholic cirrhosis given history of alcohol abuse and AST/ALT ratio of 2. Hep C and Hep B negative.Patient has prior history cirrhosis and ascites in June but patient failed to follow up with PCP after discharge. CT abd/pel (07/2019) showed moderate ascites,cirrhosis and portal hypertension, and indeterminate areas of low attenuation within the liver, possibly small liver masses.RUQ [place] confirms cirrhosis, rules out gallstones and no biliary distention. Portal vein thrombosis detected.Patient does not need steroid given Maddrey's Discriminant score of 28.3.MELD score of23(7-10% mortality in the next 3 months). Sheisnot currentlya candidate for liver transplant due to ongoing alcohol abuse.Paracentesis consistent withascites secondary to portal hypertension SAAG score > 1.1.Echo to check for possible heart failure secondary to alcohol abuse. Results came back EF 60 -65%, normal R and L systolic function, and no abnormal valves.   - Continue lactulose 20 g daily, rifaximin 550  mg daily. Patient will continue these at home. Goal titrate to have 3-4 BM a day.  - ContinueSpironolactone150 mg/Lasix60 mgPO dailyBID.Patient will continue at home. - Continue compression stocking and encourage ambulation. - 1.2L fluid restriction. Low salt diet - Oxycodone 5 mg TID PRN for pain. - Patient fluid status has improved significantly and she is almost back to her baseline. Patient is stable enough to be discharged to home. - Pain control is difficult for her because she has been received around the clock  Dilaudid. Will give patient 5 days of Opioid med for pain. Explained to her the importance of limit opioid use. Will add Zofran PRN PO as home med. Protonix also added to control GERD. Advised her on non-med pain control methods.  - She will follow-up with Dr. Lyndel Safe GI outpatient. Get CBC, BMP, Hepatic panel and PT/INR in 2 weeks - She will follow up with [place] in the clinic after discharge at least in the interim until she establish with a PCP in [place]  Liver mass CT abd/pel (07/2019) showed moderate ascites,cirrhosis and portal hypertension, and indeterminate areas of low attenuation within the liver, possibly small liver masses.Follow upMRI of the abdomenshows focal area within the posterior RIGHT hepatic lobe suspicious for hepatic neoplasm. Imaging was limited due to ascites.   - AFPnormal  -Triphasic hepatic CT (08/20/19) shows indistinct 2.4 cm segment 7 right liver mass without arterial hyperenhancement.Intermediate risk forneoplasm. Patient will need a follow up triphasic CT in 3 months.   Hypokalemia - Follow up withdailyBMP - Continue K-Dur 40 mEq twice daily x4 doses -ContinueSpironolactone150 mgandLasix60 mgPO dailyBID.  Hyponatremia-stable - Likely secondary tocirrhosis and diuretics - Continue to monitor daily BMP  Portal vein thrombosis -MRI abdomen showsminimal thrombus potentially in the LEFT portal vein - Patient is a poor candidate for anticoagulation  Pleural Effusion - CXR shows small LLL effusion -Continue to monitor I/O, daily wt - Follow 1.2L fluid restriction - Follow up in outpatient clinic.   Alcohol abuse -Blood Ethanol lv 313on admission. She reports drinking 8 beers a day. - Continue thiamine and Folate  - CIWA protocol without Ativan in place. - Advise patient tocompletely abstain from alcohol.Patient declined AA. Naltrexone is contraindicated for opioid use and decompensated liver cirrhosis.  HTN - BP is in  normal range - Continue to monitor  Diet: thin fluid IVF: NA CODE:Full DVT: Lovenox  Prior to Admission Living Arrangement:home Anticipated Discharge Location:home Barriers to Discharge:none Dispo: Anticipated discharge in approximately0day(s).  Gaylan Gerold, DO 08/23/2019, 6:20 AM Pager: [phone] After 5pm on weekdays and 1pm on weekends: On Call pager [phone]

## 2019-08-23 NOTE — Telephone Encounter (Signed)
Left message for patient to call back.all orders placed need to schedule CT f/u appointment 10/06/19

## 2019-08-23 NOTE — Hospital Course (Signed)
Decompensated cirrhosis Likely alcoholic cirrhosis given history of alcohol abuse and AST/ALT ratio of 2. Hep C and Hep B negative. CT abd/pel (07/2019) showed moderate ascites, cirrhosis and portal hypertension, and indeterminate areas of low attenuation within the liver, possibly small liver masses. RUQ [place] confirms cirrhosis, rules out gallstones and no biliary distention. Portal vein thrombosis detected. Patient does not need steroid given Maddrey's Discriminant score of 28.3. MELD score of 23 (7-10% mortality in the next 3 months). She is not currently a candidate for liver transplant due to ongoing alcohol abuse. Paracentesis consistent with ascites secondary to portal hypertension SAAG score > 1.1. Echo to check for possible heart failure secondary to alcohol abuse. Results came back EF 60 -65%, normal R and L systolic function, and no abnormal valves. She was started on Lactulose and Rifaximin, which she will continue to take at home. Vitamin K was also given for 5 days. She was effectively diuresed with Spironolactone 150 mg/ Lasix 60 mg BID and she will continue taking these at home. Patient will follow up with the Internal Medicine clinic in a week. She will also follow up with the GI office for outpatient EGD and CT.   Liver mass CT abd/pel (07/2019) showed moderate ascites, cirrhosis and portal hypertension, and indeterminate areas of low attenuation within the liver, possibly small liver masses. Follow up MRI of the abdomen shows focal area within the posterior RIGHT hepatic lobe suspicious for hepatic neoplasm. Imaging was limited due to ascites.Triphasic hepatic CT (08/20/19) shows indistinct 2.4 cm segment 7 right liver mass without arterial hyperenhancement. Intermediate risk for  neoplasm. Patient will need a follow up CT in 3 months.   Pleural Effusion CXR shows mild LLL effusion. Patient was diuresed with Spironolactone 150 mg/ Lasix 60 mg BID. She will follow up with the clinic  outpatient.  Alcohol abuse Blood Ethanol lv 313 on admission. Thiamine and Folate were added. CIWA protocol without Ativan in place. Patient was advise to completely abstain from alcohol.

## 2019-08-23 NOTE — Telephone Encounter (Signed)
TOC HFU Sch per Dr. Eileen Stanford for 08/30/2019 @ 1:15 pm.

## 2019-08-23 NOTE — Progress Notes (Signed)
Plan of care reviewed. Pt appeared generalized jaundice, icteric sclera, alert and oriented x 4, independently ambulated in her room. She's hemodynamically stable. Remained afebrile, no SOB. CIWA score 0-2.    Complained about feeling nausea, chronic abdominal pain, and low appetite. Mind to moderate abdominal distention, with minimal serous fluid weeping from old puncture site of paracentesis. Cleaned and then put dry gauze dressing on. Zofran and Oxycodone given PRN. Pt's able to tolerated.   No immediate distress. We will continue to monitor.  Kennyth Lose, RN

## 2019-08-25 ENCOUNTER — Encounter: Payer: Self-pay | Admitting: Internal Medicine

## 2019-08-25 ENCOUNTER — Ambulatory Visit (INDEPENDENT_AMBULATORY_CARE_PROVIDER_SITE_OTHER): Payer: Self-pay | Admitting: Internal Medicine

## 2019-08-25 VITALS — BP 107/83 | HR 99 | Temp 98.3°F | Ht 64.0 in | Wt 138.8 lb

## 2019-08-25 DIAGNOSIS — K7031 Alcoholic cirrhosis of liver with ascites: Secondary | ICD-10-CM

## 2019-08-25 DIAGNOSIS — K7469 Other cirrhosis of liver: Secondary | ICD-10-CM

## 2019-08-25 DIAGNOSIS — F1020 Alcohol dependence, uncomplicated: Secondary | ICD-10-CM

## 2019-08-25 LAB — PROTIME-INR
INR: 1.5 — ABNORMAL HIGH (ref 0.8–1.2)
Prothrombin Time: 17.1 seconds — ABNORMAL HIGH (ref 11.4–15.2)

## 2019-08-25 NOTE — Progress Notes (Signed)
CC: Cirrhosis  HPI:  Kayla Sandoval is a 49 y.o. female with a past medical history stated below and presents today for cirrhosis. Please see problem based assessment and plan for additional details.  Past Medical History:  Diagnosis Date  . Anxiety   . Depression   . Hypertension     Current Outpatient Medications on File Prior to Visit  Medication Sig Dispense Refill  . citalopram (CELEXA) 10 MG tablet Take 1 tablet (10 mg total) by mouth daily. (Patient not taking: Reported on 08/16/2019) 30 tablet 0  . furosemide (LASIX) 20 MG tablet Take 3 tablets (60 mg total) by mouth 2 (two) times daily. 540 tablet 0  . hydrOXYzine (ATARAX/VISTARIL) 25 MG tablet Take 1 tablet (25 mg total) by mouth every 6 (six) hours as needed (anxiety/agitation or CIWA < or = 10). (Patient not taking: Reported on 08/16/2019) 30 tablet 0  . lactulose (CHRONULAC) 10 GM/15ML solution Take 30 mLs (20 g total) by mouth 2 (two) times daily. 5400 mL 0  . Melatonin 10 MG TABS Take 10 mg by mouth at bedtime as needed (sleep).    . nicotine (NICODERM CQ - DOSED IN MG/24 HOURS) 14 mg/24hr patch Place 1 patch (14 mg total) onto the skin daily. (Patient not taking: Reported on 08/16/2019) 28 patch 0  . ondansetron (ZOFRAN) 4 MG tablet Take 1 tablet (4 mg total) by mouth 2 (two) times daily as needed for up to 14 days for nausea or vomiting. 28 tablet 0  . oxyCODONE (OXY IR/ROXICODONE) 5 MG immediate release tablet Take 1 tablet (5 mg total) by mouth 3 (three) times daily as needed for up to 5 days for severe pain or breakthrough pain. 15 tablet 0  . pantoprazole (PROTONIX) 40 MG tablet Take 1 tablet (40 mg total) by mouth daily. 90 tablet 0  . QUEtiapine (SEROQUEL) 25 MG tablet Take 1 tablet (25 mg total) by mouth at bedtime. (Patient not taking: Reported on 08/16/2019) 30 tablet 0  . rifaximin (XIFAXAN) 550 MG TABS tablet Take 1 tablet (550 mg total) by mouth 2 (two) times daily. 180 tablet 0  . Saccharomyces boulardii  (PROBIOTIC) 250 MG CAPS Take 1 capsule by mouth daily. (Patient not taking: Reported on 08/16/2019) 30 capsule 0  . spironolactone (ALDACTONE) 50 MG tablet Take 3 tablets (150 mg total) by mouth 2 (two) times daily. 540 tablet 0  . traZODone (DESYREL) 50 MG tablet Take 1 tablet (50 mg total) by mouth at bedtime as needed for sleep. 30 tablet 0   No current facility-administered medications on file prior to visit.    Family History  Problem Relation Age of Onset  . Depression Mother   . Hypertension Mother     Social History   Socioeconomic History  . Marital status: Widowed    Spouse name: Not on file  . Number of children: Not on file  . Years of education: Not on file  . Highest education level: Not on file  Occupational History  . Not on file  Tobacco Use  . Smoking status: Former Smoker    Packs/day: 0.50    Years: 10.00    Pack years: 5.00    Types: Cigarettes    Quit date: 06/25/2019    Years since quitting: 0.1  . Smokeless tobacco: Never Used  Vaping Use  . Vaping Use: Never used  Substance and Sexual Activity  . Alcohol use: Yes    Alcohol/week: 3.0 standard drinks  Types: 3 Cans of beer per week    Comment: nightly  . Drug use: Not Currently    Types: Marijuana  . Sexual activity: Never  Other Topics Concern  . Not on file  Social History Narrative   Smoked "a few cigarettes a day" for many years up until 02/2011. Drinks "a few" drinks of alcohol on weekend, never until drunk or passing out.  Denies illicit drug use.  Stay at home mom.   Social Determinants of Health   Financial Resource Strain:   . Difficulty of Paying Living Expenses:   Food Insecurity:   . Worried About Charity fundraiser in the Last Year:   . Arboriculturist in the Last Year:   Transportation Needs:   . Film/video editor (Medical):   Marland Kitchen Lack of Transportation (Non-Medical):   Physical Activity:   . Days of Exercise per Week:   . Minutes of Exercise per Session:   Stress:     . Feeling of Stress :   Social Connections:   . Frequency of Communication with Friends and Family:   . Frequency of Social Gatherings with Friends and Family:   . Attends Religious Services:   . Active Member of Clubs or Organizations:   . Attends Archivist Meetings:   Marland Kitchen Marital Status:   Intimate Partner Violence:   . Fear of Current or Ex-Partner:   . Emotionally Abused:   Marland Kitchen Physically Abused:   . Sexually Abused:     Review of Systems: ROS negative except for what is noted on the assessment and plan.  Vitals:   08/25/19 1457  BP: 107/83  Pulse: 99  Temp: 98.3 F (36.8 C)  TempSrc: Oral  SpO2: 98%  Weight: 138 lb 12.8 oz (63 kg)  Height: 5\' 4"  (1.626 m)     Physical Exam: Physical Exam Constitutional:      Appearance: Normal appearance.  Eyes:     General: Scleral icterus present.     Extraocular Movements: Extraocular movements intact.  Cardiovascular:     Rate and Rhythm: Normal rate.     Pulses: Normal pulses.     Heart sounds: Normal heart sounds.  Pulmonary:     Effort: Pulmonary effort is normal.     Breath sounds: Normal breath sounds.  Abdominal:     General: Bowel sounds are normal. There is distension.     Palpations: There is no shifting dullness or fluid wave.     Tenderness: There is abdominal tenderness.  Musculoskeletal:        General: No swelling. Normal range of motion.     Cervical back: Normal range of motion.  Skin:    General: Skin is warm and dry.     Coloration: Skin is jaundiced.  Neurological:     Mental Status: She is alert and oriented to person, place, and time. Mental status is at baseline.  Psychiatric:        Mood and Affect: Mood normal.      Assessment & Plan:   See Encounters Tab for problem based charting.  Patient discussed with Dr. Bertell Maria, D.O. [facility], PGY-2 Pager: [phone], Phone: [phone] Date 08/26/2019 Time 10:29 AM

## 2019-08-25 NOTE — Telephone Encounter (Signed)
LMOM for patient to call back.

## 2019-08-25 NOTE — Patient Instructions (Signed)
Thank you, Ms.Anniemae A Vowels for allowing [place] to provide your care today. Today we discussed liver disease and recent hospitalization.    I have ordered the following labs for you:   Lab Orders     CMP14 + Anion Gap     CBC no Diff     Protime-INR   I will call if any are abnormal. All of your labs can be accessed through "My Chart".  I have place a referrals to to the health department for referral for PCP ..  I have ordered the following tests: none   I have ordered the following medication/changed the following medications:  1. continue all medicaitons  Please follow-up as needed. Please make sure to follow up with GI.   Should you have any questions or concerns please call the internal medicine clinic at [phone].    Marianna Payment, D.O. [facility]   My Chart Access: [email or web address]   If you have not already done so, please get your COVID 19 vaccine  To schedule an appointment for a COVID vaccine choice any of the following: Go to [email or web address]   Go to [email or web address]                  Call [phone]                                     Call [phone] and select Option 2

## 2019-08-26 ENCOUNTER — Telehealth: Payer: Self-pay | Admitting: *Deleted

## 2019-08-26 ENCOUNTER — Encounter: Payer: Self-pay | Admitting: Internal Medicine

## 2019-08-26 ENCOUNTER — Telehealth: Payer: Self-pay | Admitting: Physician Assistant

## 2019-08-26 LAB — CMP14 + ANION GAP
ALT: 32 IU/L (ref 0–32)
AST: 122 IU/L — ABNORMAL HIGH (ref 0–40)
Albumin/Globulin Ratio: 0.8 — ABNORMAL LOW (ref 1.2–2.2)
Albumin: 3.1 g/dL — ABNORMAL LOW (ref 3.8–4.8)
Alkaline Phosphatase: 216 IU/L — ABNORMAL HIGH (ref 48–121)
Anion Gap: 18 mmol/L (ref 10.0–18.0)
BUN/Creatinine Ratio: 9 (ref 9–23)
BUN: 5 mg/dL — ABNORMAL LOW (ref 6–24)
Bilirubin Total: 6.9 mg/dL — ABNORMAL HIGH (ref 0.0–1.2)
CO2: 18 mmol/L — ABNORMAL LOW (ref 20–29)
Calcium: 8.3 mg/dL — ABNORMAL LOW (ref 8.7–10.2)
Chloride: 97 mmol/L (ref 96–106)
Creatinine, Ser: 0.56 mg/dL — ABNORMAL LOW (ref 0.57–1.00)
GFR calc Af Amer: 127 mL/min/{1.73_m2} (ref >59)
GFR calc non Af Amer: 110 mL/min/{1.73_m2} (ref >59)
Globulin, Total: 3.9 g/dL (ref 1.5–4.5)
Glucose: 108 mg/dL — ABNORMAL HIGH (ref 65–99)
Potassium: 3.9 mmol/L (ref 3.5–5.2)
Sodium: 133 mmol/L — ABNORMAL LOW (ref 134–144)
Total Protein: 7 g/dL (ref 6.0–8.5)

## 2019-08-26 LAB — CBC
Hematocrit: 40.3 % (ref 34.0–46.6)
Hemoglobin: 14.1 g/dL (ref 11.1–15.9)
MCH: 37.2 pg — ABNORMAL HIGH (ref 26.6–33.0)
MCHC: 35 g/dL (ref 31.5–35.7)
MCV: 106 fL — ABNORMAL HIGH (ref 79–97)
Platelets: 200 10*3/uL (ref 150–450)
RBC: 3.79 x10E6/uL (ref 3.77–5.28)
RDW: 12.5 % (ref 11.7–15.4)
WBC: 6.3 10*3/uL (ref 3.4–10.8)

## 2019-08-26 NOTE — Telephone Encounter (Signed)
Called to ask if patient could receive a script for pain medication until her appt. On 08/4.  She is in a lot of pain and they only gave her 5 pills when she left the hospital.  Please advise and call to advise patient at [phone]

## 2019-08-26 NOTE — Telephone Encounter (Signed)
Pt was call and inform her that she already saw a Primary care provider and she can not have 2 different, to please call the IMP to get the medication and help she need,LVM with this info

## 2019-08-26 NOTE — Telephone Encounter (Signed)
Pt and her mother call, pt is in background, mother is on phone, she states dr knew she is suppose to be taking hydrocodone, pt corrects mother and states oxycodone and he didn't refill her medicine yesterday. She ask what pt is to do. She is advised that the amt given should last thru 7/17 and to call and make appt asap with a provider that accepts new pts but that nurse will ask doctor for a refill she also wants to know when she will get medicaid. She is advised to call dept of social services locally and ask for update. Pt lives in [place], [place].  She uses [facility]

## 2019-08-26 NOTE — Telephone Encounter (Signed)
LMOM for patient to call back.

## 2019-08-26 NOTE — Assessment & Plan Note (Addendum)
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  She states that her symptoms have drastically improved since her admission.  She states that she continues to have some abdominal distention and tenderness.  Otherwise she denies nausea, vomiting, or overt signs of bleeding.  Patient states that she is currently having roughly 1-3 bowel movements daily.  She states that she is tolerating all of her medications well.  Spironolactone 150 mg twice daily, furosemide 60 mg twice daily, lactulose 10 mg titrate up to 3 times daily to achieve 2-3 BMs. She states that she does not have insurance and was not able to afford the rifaximin.  Patient is not a resident of [place] and lives in [place].  Therefore, she does not qualify for financial assistance through our clinic point.  I counseled her on going to the health department in [facility] to establish care at a local primary care facility.  Furthermore, I also counseled her on the importance of following up with a gastroenterologist.  She has been set up with an appointment at the time of hospital  Discharge.  She has been made aware of the date and time of this appointment.  Instructed her that they will likely have financial resources to help you obtain these medications.  Patient has a persistently elevated AST of 122 and ALP 216.  Patient's bilirubin is elevated at 6.9 and trending downward.  Albumin is still low at 3.1 and sodium of 133 today.  Patient's platelets are within normal limits at greater than 200.  Patient has a mild coagulopathy with an INR of 1.5 today.  On exam patient does have a distended abdomen that is mildly tender but does not have a significant fluid wave concerning for ascites.  Patient does have scleral icterus with jaundice present.   Plan: -Patient has appointment set up for gastroenterology -Counseled to continue the medications -Counseled to set  up primary care services in [facility].

## 2019-08-26 NOTE — Telephone Encounter (Signed)
I called back the Pt, explaining her that she already establish care at [facility] To please call them an to explain her medical condition that way she will probably get her medication she need, also she can not have two primary doctor, LVM to her

## 2019-08-26 NOTE — Telephone Encounter (Signed)
Patient's mother calling back, she states she does not know what IMP is and would like to speak with Clifton James.

## 2019-08-27 ENCOUNTER — Telehealth: Payer: Self-pay | Admitting: *Deleted

## 2019-08-27 NOTE — Telephone Encounter (Signed)
Left several messages on patient's voicemail to contact our office regarding Carl Best PA recommendations for further evaluation. She has not returned the call. A letter has been mailed to patient to contact our office at her soonest convenience regarding this matter.

## 2019-08-27 NOTE — Telephone Encounter (Signed)
Pt has been seen, will not be coming to [facility] for care. Per sharonp. [facility] will not need to do HFU call

## 2019-08-27 NOTE — Telephone Encounter (Signed)
Pt's mother calls and ask again today for oxycodone for pt. Nurse spoke to dr Marianna Payment and he states no, [facility] will not be able to refill narcotics for pt. She needs to speak w/ her usual pcp. Called mother and gave her the message

## 2019-08-27 NOTE — Telephone Encounter (Signed)
Pls contact pt daughter [phone]

## 2019-08-30 ENCOUNTER — Encounter: Payer: Self-pay | Admitting: Internal Medicine

## 2019-08-31 NOTE — Progress Notes (Signed)
Internal Medicine Clinic Attending  Case discussed with Dr. Coe  At the time of the visit.  We reviewed the resident's history and exam and pertinent patient test results.  I agree with the assessment, diagnosis, and plan of care documented in the resident's note.  

## 2019-09-13 ENCOUNTER — Telehealth: Payer: Self-pay

## 2019-09-13 NOTE — Telephone Encounter (Signed)
Contacted pt to make her aware that she is already established at [facility] and will need to cancel appt. Ask pt to give [place] a call back at her earliest convenience.

## 2019-09-15 ENCOUNTER — Other Ambulatory Visit: Payer: Self-pay

## 2019-09-15 ENCOUNTER — Ambulatory Visit: Payer: Self-pay | Attending: Physician Assistant | Admitting: Physician Assistant

## 2019-09-15 ENCOUNTER — Ambulatory Visit: Payer: Self-pay

## 2019-10-04 NOTE — Telephone Encounter (Signed)
Patient is calling to get her medication refilled. Patient does not have IMP number. Please advise Clifton [phone]

## 2019-10-05 ENCOUNTER — Telehealth: Payer: Self-pay | Admitting: General Practice

## 2019-10-05 NOTE — Telephone Encounter (Signed)
RTC, no answer, no vm. SChaplin, RN,BSN

## 2019-10-05 NOTE — Telephone Encounter (Signed)
Pls contact pt regarding medicine [phone]

## 2019-10-06 ENCOUNTER — Encounter: Payer: Self-pay | Admitting: Student

## 2019-10-06 ENCOUNTER — Ambulatory Visit: Payer: Self-pay | Admitting: Gastroenterology

## 2019-10-06 NOTE — Telephone Encounter (Signed)
Returned call to patient. States she needs refills on lactulose, pantoprazole, spironolactone, and furosemide. Will need these sent to [facility] under IM Program as she has no insurance at this time. She also has questions regarding other meds on her list that have not been written since 2018. Appt made today for med management with [facility]. Requested she bring all med bottles with her. Will need map to [facility]. Hubbard Hartshorn, BSN, RN-BC

## 2019-10-06 NOTE — Telephone Encounter (Signed)
Pls contact regarding medicine [phone]

## 2019-10-07 NOTE — Telephone Encounter (Signed)
Patient is not managed by a PCP at this clinic. I cannot provide any med refills.

## 2019-10-12 ENCOUNTER — Encounter: Payer: Self-pay | Admitting: Student

## 2019-10-12 ENCOUNTER — Ambulatory Visit (INDEPENDENT_AMBULATORY_CARE_PROVIDER_SITE_OTHER): Payer: Self-pay | Admitting: Student

## 2019-10-12 ENCOUNTER — Other Ambulatory Visit: Payer: Self-pay

## 2019-10-12 VITALS — BP 114/69 | HR 102 | Temp 98.0°F | Ht 64.0 in | Wt 152.5 lb

## 2019-10-12 DIAGNOSIS — G4700 Insomnia, unspecified: Secondary | ICD-10-CM

## 2019-10-12 DIAGNOSIS — R140 Abdominal distension (gaseous): Secondary | ICD-10-CM

## 2019-10-12 DIAGNOSIS — G4701 Insomnia due to medical condition: Secondary | ICD-10-CM

## 2019-10-12 DIAGNOSIS — F32A Depression, unspecified: Secondary | ICD-10-CM

## 2019-10-12 DIAGNOSIS — K7030 Alcoholic cirrhosis of liver without ascites: Secondary | ICD-10-CM

## 2019-10-12 DIAGNOSIS — F329 Major depressive disorder, single episode, unspecified: Secondary | ICD-10-CM

## 2019-10-12 DIAGNOSIS — R160 Hepatomegaly, not elsewhere classified: Secondary | ICD-10-CM

## 2019-10-12 MED ORDER — MELATONIN 10 MG PO TABS
10.0000 mg | ORAL_TABLET | Freq: Every evening | ORAL | 2 refills | Status: DC | PRN
Start: 2019-10-12 — End: 2019-11-09

## 2019-10-12 MED ORDER — SPIRONOLACTONE 50 MG PO TABS: 150.0000 mg | ORAL_TABLET | Freq: Two times a day (BID) | ORAL | 0 refills | Status: DC

## 2019-10-12 MED ORDER — LACTULOSE 10 GM/15ML PO SOLN: 20.0000 g | Freq: Two times a day (BID) | ORAL | 0 refills | Status: DC

## 2019-10-12 MED ORDER — RIFAXIMIN 550 MG PO TABS: 550.0000 mg | ORAL_TABLET | Freq: Two times a day (BID) | ORAL | 0 refills | Status: DC

## 2019-10-12 MED ORDER — FUROSEMIDE 20 MG PO TABS: 60.0000 mg | ORAL_TABLET | Freq: Two times a day (BID) | ORAL | 0 refills | Status: DC

## 2019-10-12 MED ORDER — PANTOPRAZOLE SODIUM 40 MG PO TBEC: 40.0000 mg | DELAYED_RELEASE_TABLET | Freq: Every day | ORAL | 0 refills | Status: DC

## 2019-10-12 MED ORDER — TRAZODONE HCL 50 MG PO TABS: 50.0000 mg | ORAL_TABLET | Freq: Every evening | ORAL | 0 refills | Status: DC | PRN

## 2019-10-12 MED ORDER — CITALOPRAM HYDROBROMIDE 10 MG PO TABS
10.0000 mg | ORAL_TABLET | Freq: Every day | ORAL | 0 refills | Status: DC
Start: 2019-10-12 — End: 2019-11-09

## 2019-10-12 MED FILL — SPIRONOLACTONE 50 MG TABS: 50 | 30 days supply | Qty: 180 | Fill #0

## 2019-10-12 MED FILL — FUROSEMIDE 20 MG TABS: 20 | 30 days supply | Qty: 180 | Fill #0

## 2019-10-12 MED FILL — traZODone HCL 50 MG TABS: 50 | 30 days supply | Qty: 30 | Fill #0

## 2019-10-12 MED FILL — PANTOPRAZOLE SOD DR 40 MG T: 40 | 30 days supply | Qty: 30 | Fill #0

## 2019-10-12 MED FILL — CITALOPRAM HBR 10 MG TABLET: 10 | 30 days supply | Qty: 30 | Fill #0

## 2019-10-12 MED FILL — GENERLAC 10 GM/15 ML SOLN: 10 | 30 days supply | Qty: 1800 | Fill #0

## 2019-10-12 NOTE — Progress Notes (Signed)
CC: follow up visit  HPI:  Kayla Sandoval is a 49 y.o. female with history of alcoholic cirrhosis, depression, insomnia presenting to the clinic for hospital follow up. Please see individualized A/P for full HPI.  Past Medical History:  Diagnosis Date  . Anxiety   . Depression   . Hypertension    Medications: -spironolactone 150mg  BID -Lasix 60mg  BID -Rifaximin 550mg  BID -Lactulose 20g BID -Trazodone 50mg  qhs -Melatonin 10mg  qhs -Celexa 10mg   No Known Allergies Family History  Problem Relation Age of Onset  . Depression Mother   . Hypertension Mother    Social History   Socioeconomic History  . Marital status: Widowed    Spouse name: Not on file  . Number of children: Not on file  . Years of education: Not on file  . Highest education level: Not on file  Occupational History  . Not on file  Tobacco Use  . Smoking status: Former Smoker    Packs/day: 0.50    Years: 10.00    Pack years: 5.00    Types: Cigarettes    Quit date: 06/25/2019    Years since quitting: 0.2  . Smokeless tobacco: Never Used  Vaping Use  . Vaping Use: Never used  Substance and Sexual Activity  . Alcohol use: Yes    Alcohol/week: 3.0 standard drinks    Types: 3 Cans of beer per week    Comment: nightly  . Drug use: Not Currently    Types: Marijuana  . Sexual activity: Never  Other Topics Concern  . Not on file  Social History Narrative   Smoked "a few cigarettes a day" for many years up until 02/2011. Drinks "a few" drinks of alcohol on weekend, never until drunk or passing out.  Denies illicit drug use.  Stay at home mom.   Social Determinants of Health   Financial Resource Strain:   . Difficulty of Paying Living Expenses: Not on file  Food Insecurity:   . Worried About Charity fundraiser in the Last Year: Not on file  . Ran Out of Food in the Last Year: Not on file  Transportation Needs:   . Lack of Transportation (Medical): Not on file  . Lack of Transportation  (Non-Medical): Not on file  Physical Activity:   . Days of Exercise per Week: Not on file  . Minutes of Exercise per Session: Not on file  Stress:   . Feeling of Stress : Not on file  Social Connections:   . Frequency of Communication with Friends and Family: Not on file  . Frequency of Social Gatherings with Friends and Family: Not on file  . Attends Religious Services: Not on file  . Active Member of Clubs or Organizations: Not on file  . Attends Archivist Meetings: Not on file  . Marital Status: Not on file  Intimate Partner Violence:   . Fear of Current or Ex-Partner: Not on file  . Emotionally Abused: Not on file  . Physically Abused: Not on file  . Sexually Abused: Not on file    Review of Systems:  Negative aside from that listed in individualized A/P.  Physical Exam:  Vitals:   10/12/19 1108  BP: 114/69  Pulse: (!) 102  Temp: 98 F (36.7 C)  TempSrc: Oral  SpO2: 99%  Weight: 152 lb 8 oz (69.2 kg)  Height: 5\' 4"  (1.626 m)   Physical Exam Constitutional:      Appearance: Normal appearance.  HENT:  Head: Normocephalic and atraumatic.     Mouth/Throat:     Mouth: Mucous membranes are moist.     Pharynx: Oropharynx is clear. No oropharyngeal exudate.  Eyes:     Extraocular Movements: Extraocular movements intact.     Conjunctiva/sclera: Conjunctivae normal.     Pupils: Pupils are equal, round, and reactive to light.  Cardiovascular:     Rate and Rhythm: Normal rate and regular rhythm.     Heart sounds: Normal heart sounds. No murmur heard.  No friction rub. No gallop.   Pulmonary:     Effort: Pulmonary effort is normal.     Breath sounds: Normal breath sounds. No wheezing, rhonchi or rales.  Abdominal:     General: Bowel sounds are normal.     Palpations: Abdomen is soft.     Tenderness: There is no guarding or rebound.     Comments: Mild distension of abdomen with mild diffuse tenderness on palpation.   Musculoskeletal:        General:  Normal range of motion.     Cervical back: Normal range of motion.     Comments: 1+ LE swelling bilaterally up to ankles.  Skin:    General: Skin is warm and dry.     Coloration: Skin is not jaundiced.  Neurological:     General: No focal deficit present.     Mental Status: She is alert and oriented to person, place, and time.  Psychiatric:        Mood and Affect: Mood normal.        Behavior: Behavior normal.        Thought Content: Thought content normal.        Judgment: Judgment normal.     Assessment & Plan:   See Encounters Tab for problem based charting.  Patient seen with Dr. Jimmye Norman

## 2019-10-12 NOTE — Assessment & Plan Note (Addendum)
Patient with history of alcoholic cirrhosis. Recently ran out of medications due to insurance issues. I have refilled her spironolactone 150mg  BID, Lasix 60mg  BID, Rifaximin 550mg  BID, and lactulose 20g BID to [facility].  Reports that she stopped smoking cigarettes on 06/25/19 and stopped drinking alcohol after she was diagnosed with alcoholic cirrhosis, which I commended her on.  She is going to schedule an appointment with her GI doctor, Dr. Lyndel Safe in [facility], for a follow up visit.  Recent CT Liver Abd with and w/out contrast showed cirrhosis with diffuse hepatic steatosis. Also showed 2.4 cm right liver mass without arterial hyperenhancement (intermediate probability of malignancy) --> she will need a follow-up MRI (preferred) or CT abdomen without and with IV contrast in 2-4 months from now.  Plan: -Continue lasix, spironolactone, lactulose, and rifaximin -f/u visit with Dr. Jackquline Denmark (GI) -f/u results of CBC, CMP -Repeat MRI (preferred) or CT abdomen with and without IV contrast in 2-4 months

## 2019-10-12 NOTE — Patient Instructions (Signed)
Kayla Sandoval,  It was a pleasure seeing you in the clinic today.  As we discussed, I have refilled your medications at the [facility].  Please schedule your appointment with Dr. Lyndel Safe (your GI doctor).  We got some labs today and I will give you a call if any of them are abnormal.  Please follow up in 1 month.

## 2019-10-12 NOTE — Assessment & Plan Note (Signed)
Patient with history of depression. Used to take celexa in the past, but stopped for unknown reason. Patient reporting being depressed lately. PHQ-9 of 17 today with complaints of anhedonia, insomnia, low energy, increased appetite, and trouble concentrating. Denies any suicidal ideation at this time.  Discussed seeing Dessie Coma, however patient refused at this time.   Plan: -Restart Celexa 10mg  -Continue Trazadone 50mg  and melatonin 10mg  at bedtime for insomnia

## 2019-10-12 NOTE — Assessment & Plan Note (Signed)
Patient reports not being able to sleep well at night. On trazadone 50mg  and melatonin 10mg  at bedtime, which patient reports has been helpful for her.  Plan: -Continue current meds

## 2019-10-13 LAB — CMP14 + ANION GAP
ALT: 15 IU/L (ref 0–32)
AST: 38 IU/L (ref 0–40)
Albumin/Globulin Ratio: 0.8 — ABNORMAL LOW (ref 1.2–2.2)
Albumin: 2.7 g/dL — ABNORMAL LOW (ref 3.8–4.8)
Alkaline Phosphatase: 117 IU/L (ref 48–121)
Anion Gap: 10 mmol/L (ref 10.0–18.0)
BUN/Creatinine Ratio: 5 — ABNORMAL LOW (ref 9–23)
BUN: 2 mg/dL — ABNORMAL LOW (ref 6–24)
Bilirubin Total: 1.6 mg/dL — ABNORMAL HIGH (ref 0.0–1.2)
CO2: 22 mmol/L (ref 20–29)
Calcium: 8.6 mg/dL — ABNORMAL LOW (ref 8.7–10.2)
Chloride: 104 mmol/L (ref 96–106)
Creatinine, Ser: 0.43 mg/dL — ABNORMAL LOW (ref 0.57–1.00)
GFR calc Af Amer: 138 mL/min/{1.73_m2} (ref >59)
GFR calc non Af Amer: 120 mL/min/{1.73_m2} (ref >59)
Globulin, Total: 3.3 g/dL (ref 1.5–4.5)
Glucose: 73 mg/dL (ref 65–99)
Potassium: 4 mmol/L (ref 3.5–5.2)
Sodium: 136 mmol/L (ref 134–144)
Total Protein: 6 g/dL (ref 6.0–8.5)

## 2019-10-13 LAB — CBC WITH DIFFERENTIAL/PLATELET
Basophils Absolute: 0 10*3/uL (ref 0.0–0.2)
Basos: 1 %
EOS (ABSOLUTE): 0.1 10*3/uL (ref 0.0–0.4)
Eos: 3 %
Hematocrit: 32.2 % — ABNORMAL LOW (ref 34.0–46.6)
Hemoglobin: 11.8 g/dL (ref 11.1–15.9)
Immature Grans (Abs): 0 10*3/uL (ref 0.0–0.1)
Immature Granulocytes: 0 %
Lymphocytes Absolute: 1.1 10*3/uL (ref 0.7–3.1)
Lymphs: 27 %
MCH: 36.8 pg — ABNORMAL HIGH (ref 26.6–33.0)
MCHC: 36.6 g/dL — ABNORMAL HIGH (ref 31.5–35.7)
MCV: 100 fL — ABNORMAL HIGH (ref 79–97)
Monocytes Absolute: 0.7 10*3/uL (ref 0.1–0.9)
Monocytes: 17 %
Neutrophils Absolute: 2.1 10*3/uL (ref 1.4–7.0)
Neutrophils: 52 %
Platelets: 123 10*3/uL — ABNORMAL LOW (ref 150–450)
RBC: 3.21 x10E6/uL — ABNORMAL LOW (ref 3.77–5.28)
RDW: 13.1 % (ref 11.7–15.4)
WBC: 4.1 10*3/uL (ref 3.4–10.8)

## 2019-10-15 ENCOUNTER — Telehealth: Payer: Self-pay | Admitting: *Deleted

## 2019-10-15 NOTE — Telephone Encounter (Signed)
[facility] needs a med to replace RIFAXIMIN, this IS NOT on the IM PROGRAM FORMULARY.  Needs replacement asap

## 2019-10-15 NOTE — Telephone Encounter (Signed)
Informed pharm

## 2019-10-20 NOTE — Progress Notes (Signed)
DOS 10/12/19:  Internal Medicine Clinic Attending  I saw and evaluated the patient.  I personally confirmed the key portions of the history and exam documented by Dr. Johnney Ou and I reviewed pertinent patient test results.  The assessment, diagnosis, and plan were formulated together and I agree with the documentation in the resident's note.

## 2019-11-09 ENCOUNTER — Encounter: Payer: Self-pay | Admitting: Student

## 2019-11-09 ENCOUNTER — Other Ambulatory Visit (HOSPITAL_COMMUNITY): Payer: Self-pay | Admitting: Student

## 2019-11-09 ENCOUNTER — Ambulatory Visit (INDEPENDENT_AMBULATORY_CARE_PROVIDER_SITE_OTHER): Payer: Self-pay | Admitting: Student

## 2019-11-09 VITALS — BP 119/80 | HR 90 | Temp 98.3°F | Wt 138.8 lb

## 2019-11-09 DIAGNOSIS — C44310 Basal cell carcinoma of skin of unspecified parts of face: Secondary | ICD-10-CM

## 2019-11-09 DIAGNOSIS — K7031 Alcoholic cirrhosis of liver with ascites: Secondary | ICD-10-CM

## 2019-11-09 DIAGNOSIS — F329 Major depressive disorder, single episode, unspecified: Secondary | ICD-10-CM

## 2019-11-09 DIAGNOSIS — C4491 Basal cell carcinoma of skin, unspecified: Secondary | ICD-10-CM | POA: Insufficient documentation

## 2019-11-09 DIAGNOSIS — G4701 Insomnia due to medical condition: Secondary | ICD-10-CM

## 2019-11-09 DIAGNOSIS — F32A Depression, unspecified: Secondary | ICD-10-CM

## 2019-11-09 MED ORDER — MELATONIN 10 MG PO TABS: 10.0000 mg | ORAL_TABLET | Freq: Every evening | ORAL | 2 refills | Status: DC | PRN

## 2019-11-09 MED ORDER — PANTOPRAZOLE SODIUM 40 MG PO TBEC: 40.0000 mg | DELAYED_RELEASE_TABLET | Freq: Every day | ORAL | 0 refills | Status: DC

## 2019-11-09 MED ORDER — SPIRONOLACTONE 50 MG PO TABS: 150.0000 mg | ORAL_TABLET | Freq: Two times a day (BID) | ORAL | 0 refills | Status: DC

## 2019-11-09 MED ORDER — TRAZODONE HCL 50 MG PO TABS: 50.0000 mg | ORAL_TABLET | Freq: Every evening | ORAL | 0 refills | Status: DC | PRN

## 2019-11-09 MED ORDER — FUROSEMIDE 20 MG PO TABS: 60.0000 mg | ORAL_TABLET | Freq: Two times a day (BID) | ORAL | 0 refills | Status: DC

## 2019-11-09 MED ORDER — CITALOPRAM HYDROBROMIDE 20 MG PO TABS: 20.0000 mg | ORAL_TABLET | Freq: Every day | ORAL | 2 refills | Status: DC

## 2019-11-09 MED ORDER — LACTULOSE 10 GM/15ML PO SOLN: 20.0000 g | Freq: Two times a day (BID) | ORAL | 0 refills | Status: DC

## 2019-11-09 MED FILL — FUROSEMIDE 20 MG TABS: 20 | 30 days supply | Qty: 180 | Fill #0

## 2019-11-09 MED FILL — CITALOPRAM HBR 20 MG TABLET: 20 | 30 days supply | Qty: 30 | Fill #0

## 2019-11-09 MED FILL — PANTOPRAZOLE SOD DR 40 MG T: 40 | 30 days supply | Qty: 30 | Fill #0

## 2019-11-09 MED FILL — GENERLAC 10 GM/15 ML SOLN: 10 | 30 days supply | Qty: 1800 | Fill #0

## 2019-11-09 MED FILL — traZODone HCL 50 MG TABS: 50 | 30 days supply | Qty: 30 | Fill #0

## 2019-11-09 MED FILL — SPIRONOLACTONE 50 MG TABS: 50 | 30 days supply | Qty: 180 | Fill #0

## 2019-11-09 NOTE — Assessment & Plan Note (Addendum)
Patient's insomnia somewhat improved with trazodone, however she has not been taking melatonin. She states that the melatonin is highly effective for her so she is interested in restarting this medication. -Continue trazodone -Restart melatonin -Discussed sleep hygiene

## 2019-11-09 NOTE — Assessment & Plan Note (Signed)
Patient reports that she is doing well on current medication regimen for her cirrhosis. She states that she has been adherent to spironolactone, lasix, protonix and lactulose as prescribed. She is scheduled to follow-up with a gastroenterologist in November. Otherwise, she has not had a relapse with her alcohol consumption.  -Continue current medication regimen -Follow-up with gastroenterologist in November -Continue to abstain from alcohol use

## 2019-11-09 NOTE — Patient Instructions (Addendum)
Dear Ms. Uliano,  It was a pleasure meeting you in clinic today.  For your depression/anxiety, we have increased your citalopram from 10mg  to 20mg   For your cirrhosis, please continue taking your lasix, spironolactone and lactulose.  For your insomnia, please continue taking the trazodone and restart the melatonin.  For the spot on your right cheek, we have placed a referral to dermatology. Please make sure to follow-up with them when they call to schedule an appointment.  Otherwise, we look forward to seeing you at your next appointment. Please, call us with any questions or concerns.  Sincerely, Dr. Fausto Skillern, MD

## 2019-11-09 NOTE — Assessment & Plan Note (Signed)
Patient has history of depression with PHQ9 today of 14 which is an improvement from prior of 36. She states that the celexa has been helping her, however, she is interested in a higher dose. -Increase citalopram from 10mg  to 20mg  daily

## 2019-11-09 NOTE — Progress Notes (Signed)
Internal Medicine Clinic Attending  I saw and evaluated the patient.  I personally confirmed the key portions of the history and exam documented by Dr. Johnson and I reviewed pertinent patient test results.  The assessment, diagnosis, and plan were formulated together and I agree with the documentation in the resident's note.  

## 2019-11-09 NOTE — Progress Notes (Signed)
   CC: 38-month follow-up  HPI:  [name] is a 49 y.o. with past medical history of alcoholic cirrhosis, depression, and insomnia who presents to clinic for 36-month follow-up. Refer to problem list for Assessment/Plan based charting of this encounter.  Past Medical History:  Diagnosis Date  . Acute encephalopathy 04/03/2015  . Acute respiratory failure with hypoxia ([place]) 04/03/2015  . Acute stress disorder 04/08/2015  . Alcohol abuse 08/16/2019  . Alcohol intoxication ([place]) 04/03/2015  . Alcohol use disorder, moderate, dependence ([place]) 02/15/2016  . Alcohol withdrawal ([place]) 04/06/2015  . Anxiety   . CAP (community acquired pneumonia) 04/03/2015  . Depression   . Feeling grief   . Hemothorax on left 04/09/2011   03/27/11 >Traumatic CT chest showed large left effusion with mediastinal shift, collapse of left lung, cannot exclude underlying mass . >>s/p Left  chest tube >3.5 liters of very bloody fluid >Pleural fluid showed inflammatory cells with no malignant cells noted.  Serial CXR w/ rexpansion 04/05/11>>will need follow up cxr for total clearance >>   . History of head injury 02/15/2016  . History of spinal fracture 02/15/2016  . Hypertension   . Macrocytosis without anemia 03/27/2011  . Overdose 04/03/2015  . Pleural effusion 03/27/2011  . Pneumonia 03/27/2011  . Recent bereavement 04/07/2015  . 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 Tobacco abuse 03/27/2011  . Tobacco use disorder 04/03/2015   Review of Systems:  Endorses depression, insomnia, mild abdominal discomfort. Denies chest pain, shortness of breath, fevers, chills.  Physical Exam:  Vitals:   11/09/19 1114  BP: 119/80  Pulse: 90  Temp: 98.3 F (36.8 C)  TempSrc: Oral  SpO2: 97%  Weight: 138 lb 12.8 oz (63 kg)   Physical Exam Constitutional:      Appearance: Normal appearance.  HENT:     Head: Normocephalic and atraumatic.     Nose: Nose normal.  Eyes:      Extraocular Movements: Extraocular movements intact.     Conjunctiva/sclera: Conjunctivae normal.  Cardiovascular:     Rate and Rhythm: Normal rate and regular rhythm.     Pulses: Normal pulses.     Heart sounds: Normal heart sounds.  Pulmonary:     Effort: Pulmonary effort is normal.     Breath sounds: Normal breath sounds.  Abdominal:     General: Abdomen is flat. Bowel sounds are normal.     Palpations: Abdomen is soft.  Musculoskeletal:        General: Normal range of motion.     Cervical back: Normal range of motion and neck supple.  Skin:    General: Skin is warm and dry.     Capillary Refill: Capillary refill takes less than 2 seconds.     Comments: Pearly nodule with central ulceration and overlying telangiectasias of the right nose  Neurological:     General: No focal deficit present.     Mental Status: She is alert. Mental status is at baseline.  Psychiatric:        Mood and Affect: Mood normal.        Behavior: Behavior normal.        Thought Content: Thought content normal.        Judgment: Judgment normal.     Assessment & Plan:   See Encounters Tab for problem based charting.  Patient seen with Dr. Jimmye Norman

## 2019-11-09 NOTE — Assessment & Plan Note (Addendum)
Patient has large, pearly nodule with central ulceration and overlying telangiectasias of the right nose. 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 -Refer to dermatology

## 2019-11-19 ENCOUNTER — Telehealth: Payer: Self-pay | Admitting: Nurse Practitioner

## 2019-11-19 ENCOUNTER — Other Ambulatory Visit: Payer: Self-pay | Admitting: Nurse Practitioner

## 2019-11-19 ENCOUNTER — Encounter: Payer: Self-pay | Admitting: Internal Medicine

## 2019-11-19 DIAGNOSIS — K7031 Alcoholic cirrhosis of liver with ascites: Secondary | ICD-10-CM

## 2019-11-19 NOTE — Telephone Encounter (Signed)
Kelly, pls contact the patient as she did not complete the previously requested lab orders. Pls reorder labs to include a  CBC, BMP, Hepatic panel and PT/INR. thx

## 2019-11-19 NOTE — Telephone Encounter (Signed)
Kayla Sandoval, I have called this patient and left a voicemail regarding your message for labs. I also left her several messages in July regarding labs that were due and an office visit with Dr Lyndel Safe. When She did not return my calls, I mailed her a letter. She was a no show with Dr Lyndel Safe in August as well. I will place the orders again and continue to try and reach her.  Thanks, [employer]

## 2019-11-24 ENCOUNTER — Telehealth: Payer: Self-pay

## 2019-11-24 MED FILL — GENERLAC 10 GM/15 ML SOLN: 10 | 30 days supply | Qty: 1800 | Fill #0

## 2019-11-24 NOTE — Telephone Encounter (Signed)
Unable to reach patient by phone to remind her of needed lab tests. A letter has been mailed to patient.

## 2019-11-24 NOTE — Telephone Encounter (Signed)
-----   Message from Noralyn Pick, NP sent at 11/19/2019  3:36 PM EDT ----- Regarding: RE: labs Thank you for your efforts Claiborne Billings, that's all we can do. It is up to her now.  ----- Message ----- From: Angie Fava, LPN Sent: 25/07/3891   3:29 PM EDT To: Noralyn Pick, NP Subject: labs                                           Jaclyn Shaggy, I have called this patient and left a voicemail regarding your message for labs. I also left her several messages back in July regarding labs that were due and an office visit with Dr Lyndel Safe. When She did not return my calls, I mailed her a letter. She was a no show with Dr Lyndel Safe in August as well. I will place the orders again and continue to try and reach her.  Thanks, [employer]

## 2019-12-02 ENCOUNTER — Encounter: Payer: Self-pay | Admitting: Internal Medicine

## 2019-12-14 ENCOUNTER — Ambulatory Visit (INDEPENDENT_AMBULATORY_CARE_PROVIDER_SITE_OTHER): Payer: Self-pay | Admitting: Student

## 2019-12-14 ENCOUNTER — Other Ambulatory Visit: Payer: Self-pay | Admitting: Student

## 2019-12-14 ENCOUNTER — Other Ambulatory Visit: Payer: Self-pay

## 2019-12-14 ENCOUNTER — Encounter: Payer: Self-pay | Admitting: Student

## 2019-12-14 DIAGNOSIS — G4701 Insomnia due to medical condition: Secondary | ICD-10-CM

## 2019-12-14 DIAGNOSIS — K7031 Alcoholic cirrhosis of liver with ascites: Secondary | ICD-10-CM

## 2019-12-14 DIAGNOSIS — C44310 Basal cell carcinoma of skin of unspecified parts of face: Secondary | ICD-10-CM

## 2019-12-14 DIAGNOSIS — K0889 Other specified disorders of teeth and supporting structures: Secondary | ICD-10-CM

## 2019-12-14 DIAGNOSIS — F32A Depression, unspecified: Secondary | ICD-10-CM

## 2019-12-14 DIAGNOSIS — R160 Hepatomegaly, not elsewhere classified: Secondary | ICD-10-CM

## 2019-12-14 DIAGNOSIS — K7030 Alcoholic cirrhosis of liver without ascites: Secondary | ICD-10-CM

## 2019-12-14 MED ORDER — PANTOPRAZOLE SODIUM 40 MG PO TBEC: 40.0000 mg | DELAYED_RELEASE_TABLET | Freq: Every day | ORAL | 0 refills | Status: DC

## 2019-12-14 MED ORDER — SPIRONOLACTONE 50 MG PO TABS: 150.0000 mg | ORAL_TABLET | Freq: Two times a day (BID) | ORAL | 0 refills | Status: DC

## 2019-12-14 MED ORDER — CITALOPRAM HYDROBROMIDE 20 MG PO TABS: 20.0000 mg | ORAL_TABLET | Freq: Every day | ORAL | 11 refills | Status: DC

## 2019-12-14 MED ORDER — MELATONIN 10 MG PO TABS: 10.0000 mg | ORAL_TABLET | Freq: Every evening | ORAL | 2 refills | Status: DC | PRN

## 2019-12-14 MED ORDER — FUROSEMIDE 20 MG PO TABS: 60.0000 mg | ORAL_TABLET | Freq: Two times a day (BID) | ORAL | 0 refills | Status: DC

## 2019-12-14 MED ORDER — TRAZODONE HCL 50 MG PO TABS: 50.0000 mg | ORAL_TABLET | Freq: Every evening | ORAL | 0 refills | Status: DC | PRN

## 2019-12-14 MED ORDER — LACTULOSE 10 GM/15ML PO SOLN: 20.0000 g | Freq: Two times a day (BID) | ORAL | 0 refills | Status: DC

## 2019-12-14 MED FILL — PANTOPRAZOLE SOD DR 40 MG T: 40 | 30 days supply | Qty: 30 | Fill #0

## 2019-12-14 MED FILL — GENERLAC 10 GM/15 ML SOLN: 10 | 30 days supply | Qty: 1800 | Fill #0

## 2019-12-14 MED FILL — SPIRONOLACTONE 50 MG TABS: 50 | 30 days supply | Qty: 180 | Fill #0

## 2019-12-14 MED FILL — CITALOPRAM HBR 20 MG TABLET: 20 | 30 days supply | Qty: 30 | Fill #0

## 2019-12-14 MED FILL — traZODone HCL 50 MG TABS: 50 | 30 days supply | Qty: 30 | Fill #0

## 2019-12-14 MED FILL — FUROSEMIDE 20 MG TABS: 20 | 30 days supply | Qty: 180 | Fill #0

## 2019-12-14 NOTE — Assessment & Plan Note (Signed)
CT liver abd showed 2.4 cm right liver mass with intermediate probability of malignancy. She is to see Dr. Lyndel Safe (GI) next Tuesday, who is to perform a CT abdomen w/ and w/o contrast to evaluate liver mass. -f/u GI recs

## 2019-12-14 NOTE — Assessment & Plan Note (Signed)
Reports that she chipped her tooth while chewing gum. She has been having pain in that area especially with chewing. Reports taking leftover amoxicillin for past 10 days for it. Does not want to go to a dentist at this time due to lack of insurance. Would rather wait until disability insurance kicks in. Unfortunately, she does not qualify for an orange card as she is a resident of [facility]. Advised her to call [facility] and see if they have any resources for a free dental clinic visit for further evaluation.

## 2019-12-14 NOTE — Progress Notes (Signed)
CC: f/u visit  HPI:  Ms.Simisola A Manganelli is a 49 y.o. female with history of alcoholic cirrhosis, insomnia, depression presenting to the [facility] for a follow up visit. Please see individualized A&P for full HPI.  Past Medical History:  Diagnosis Date  . Acute encephalopathy 04/03/2015  . Acute respiratory failure with hypoxia ([place]) 04/03/2015  . Acute stress disorder 04/08/2015  . Alcohol abuse 08/16/2019  . Alcohol intoxication ([place]) 04/03/2015  . Alcohol use disorder, moderate, dependence ([place]) 02/15/2016  . Alcohol withdrawal ([place]) 04/06/2015  . Anxiety   . CAP (community acquired pneumonia) 04/03/2015  . Depression   . Feeling grief   . Hemothorax on left 04/09/2011   03/27/11 >Traumatic CT chest showed large left effusion with mediastinal shift, collapse of left lung, cannot exclude underlying mass . >>s/p Left  chest tube >3.5 liters of very bloody fluid >Pleural fluid showed inflammatory cells with no malignant cells noted.  Serial CXR w/ rexpansion 04/05/11>>will need follow up cxr for total clearance >>   . History of head injury 02/15/2016  . History of spinal fracture 02/15/2016  . Hypertension   . Macrocytosis without anemia 03/27/2011  . Overdose 04/03/2015  . Pleural effusion 03/27/2011  . Pneumonia 03/27/2011  . Recent bereavement 04/07/2015  . 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 Tobacco abuse 03/27/2011  . Tobacco use disorder 04/03/2015   Review of Systems:   Endorses nausea, vomiting, joint aches, tooth ache.  Otherwise negative ROS  Physical Exam:  Vitals:   12/14/19 1114  BP: 123/73  Pulse: 80  Temp: 98.2 F (36.8 C)  TempSrc: Oral  SpO2: 99%  Weight: 143 lb 6.4 oz (65 kg)  Height: 5\' 4"  (1.626 m)   Physical Exam Constitutional:      Appearance: Normal appearance. She is not ill-appearing.  HENT:     Head: Normocephalic and atraumatic.     Nose:     Comments: Pearly lesion with telangiectasia on right cheek  next to nose.    Mouth/Throat:     Comments: Left molar chipped off. TTP. No bleeding or erythema noted. Eyes:     General: No scleral icterus.    Extraocular Movements: Extraocular movements intact.     Conjunctiva/sclera: Conjunctivae normal.     Pupils: Pupils are equal, round, and reactive to light.  Cardiovascular:     Rate and Rhythm: Normal rate and regular rhythm.     Pulses: Normal pulses.     Heart sounds: Normal heart sounds. No murmur heard.  No friction rub. No gallop.   Pulmonary:     Effort: Pulmonary effort is normal.     Breath sounds: Normal breath sounds. No wheezing, rhonchi or rales.  Abdominal:     General: Bowel sounds are normal. There is no distension.     Palpations: Abdomen is soft.     Tenderness: There is no abdominal tenderness. There is no guarding or rebound.  Musculoskeletal:        General: No swelling or signs of injury.     Comments: Pain with ROM in hips, knees, ankles bilaterally.  Skin:    General: Skin is warm and dry.     Capillary Refill: Capillary refill takes less than 2 seconds.     Coloration: Skin is not jaundiced.     Findings: No rash.  Neurological:     General: No focal deficit present.     Mental Status: She is alert and oriented to  person, place, and time.  Psychiatric:        Mood and Affect: Mood normal.        Behavior: Behavior normal.      Assessment & Plan:   See Encounters Tab for problem based charting.  Patient seen with Dr. Jimmye Norman

## 2019-12-14 NOTE — Assessment & Plan Note (Signed)
Patient reports doing well on current medication regimen. She reports consistently taking her spironolactone, lasix, protonix, and lactulose as prescribed. To see Dr. Lyndel Safe next Tuesday for further evaluation. She reports that for the past two weeks she has been having nausea and 2 episodes of NBNB vomiting that improves with meals. She reports that she will follow up with Dr. Lyndel Safe about this during her visit. She needs a CT scan to reevaluate her liver mass, which she reports is to be done during her visit with Dr. Lyndel Safe. She reports that she has been doing well in staying away from alcohol, which I commended. -continue current medication regimen (refilled all of her medications today) -f/u with Dr. Lyndel Safe (GI) next Tuesday for further evaluation and CT imaging of liver mass -continue avoiding alcohol use

## 2019-12-14 NOTE — Patient Instructions (Signed)
[name],  It was a pleasure seeing you in the clinic today.   As we discussed, please go to your appointment with Dr. Lyndel Safe (GI) next Tuesday so that he can further evaluate your cirrhosis and nausea/vomiting.  Please call [facility] and see if they have any free resources available to evaluate your tooth pain from the chipped tooth.  I have refilled all of your medications and have sent them to the pharmacy here.  Please call our clinic at [phone] if you have any questions or concerns. The best time to call is Monday-Friday from 9am-4pm, but there is someone available 24/7 at the same number. If you need medication refills, please notify your pharmacy one week in advance and they will send [place] a request.   Thank you for letting us take part in your care. We look forward to seeing you next time!

## 2019-12-14 NOTE — Assessment & Plan Note (Signed)
Has not gone to see a dermatologist for her skin lesion. She reports that she does not have insurance is awaiting for disability insurance to kick in prior to visiting a dermatologist as she is not able to afford it at this time. -refer to dermatology once disability insurance begins

## 2019-12-14 NOTE — Assessment & Plan Note (Signed)
PHQ-9 of 6 today. Reports doing very well on celexa 20mg .  -continue celexa 20mg 

## 2019-12-14 NOTE — Assessment & Plan Note (Signed)
Reports somewhat of an improvement in sleep with trazodone and melatonin. States that she has at times had to use benadryl as well for sleep. I suspect her insomnia is connected to her alcoholic cirrhosis. Discussed good sleep hygiene techniques with her. -continue trazodone and melatonin

## 2019-12-20 NOTE — Progress Notes (Signed)
Internal Medicine Clinic Attending  I saw and evaluated the patient.  I personally confirmed the key portions of the history and exam documented by Dr. Jinwala and I reviewed pertinent patient test results.  The assessment, diagnosis, and plan were formulated together and I agree with the documentation in the resident's note.  

## 2019-12-20 NOTE — Addendum Note (Signed)
Addended by: Hulan Fray on: 12/20/2019 06:27 PM   Modules accepted: Orders

## 2019-12-21 ENCOUNTER — Encounter: Payer: Self-pay | Admitting: Gastroenterology

## 2019-12-21 ENCOUNTER — Ambulatory Visit (INDEPENDENT_AMBULATORY_CARE_PROVIDER_SITE_OTHER): Payer: Self-pay | Admitting: Gastroenterology

## 2019-12-21 ENCOUNTER — Other Ambulatory Visit: Payer: Self-pay | Admitting: Gastroenterology

## 2019-12-21 VITALS — BP 134/88 | HR 81 | Ht 64.0 in | Wt 143.4 lb

## 2019-12-21 DIAGNOSIS — K7031 Alcoholic cirrhosis of liver with ascites: Secondary | ICD-10-CM

## 2019-12-21 MED ORDER — SPIRONOLACTONE 100 MG PO TABS: 100.0000 mg | ORAL_TABLET | Freq: Two times a day (BID) | ORAL | 3 refills | Status: DC

## 2019-12-21 MED ORDER — FUROSEMIDE 40 MG PO TABS: 40.0000 mg | ORAL_TABLET | Freq: Two times a day (BID) | ORAL | 3 refills | Status: DC

## 2019-12-21 NOTE — Patient Instructions (Addendum)
If you are age 49 or older, your body mass index should be between 23-30. Your Body mass index is 24.61 kg/m. If this is out of the aforementioned range listed, please consider follow up with your Primary Care Provider.  If you are age 60 or younger, your body mass index should be between 19-25. Your Body mass index is 24.61 kg/m. If this is out of the aformentioned range listed, please consider follow up with your Primary Care Provider.   Your provider has requested that you have a triphasic CT, you will be contacted with this appointment.   Please go to the office next door to do your bloodwork before you leave today.   We have sent the following medications to your pharmacy for you to pick up at your convenience: Aldactone  Lasix   Thank you,  Dr. Jackquline Denmark

## 2019-12-21 NOTE — Progress Notes (Signed)
Chief Complaint:   Referring Provider:  Marianna Payment, MD      ASSESSMENT AND PLAN;   #1. DecompensatedETOHliver cirrhosis.lastdrink7/05/2019 AM.  #2. Hepatic mass on MRI 08/17/2019. Nl AFP.   #3. H/O ModAscites with generalized anasarcas/p LVP 08/2019. SAAG c/w portal HTN. No SBP.  Maintained on diuretics.  Ascites appears to have resolved.  #4. Subclinical hepatic encephalopathy.  #5. 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  Plan: -MRI liver with contrast -CBC, CMP, PT INR, AFP,  Mg -EGD/colon in Jan/feb 2022 (She wants to wait) -Congratulated her for alcohol abstinence. -Decrease Lasix 40mg  po BID/aldactone 100 bid. No definite ascites on physical exam and she has leg cramps. -Continue low-salt diet. -Continue lactulose. -FU therafter.  Will refer to [facility] once above work-up is complete.   HPI:    Kayla Sandoval is a 49 y.o. female  Follow-up visit. No further alcohol. She is doing great.  Has some diarrhea after lactulose. No change in mental status. Denies having any history of easy bruisability. No melena or hematochezia.  No fever or chills.  Does complain of leg cramps.   Previous GI work-up:  Abdominal MRI w/wo contrast 08/18/2019: 1. Study limited by abdominal ascites. Focal area within the posterior RIGHT hepatic lobe suspicious for hepatic neoplasm (LI-RADS 5 or LR M lesion). 2. Lesion in the LEFT hepatic lobe displays characteristic features of a hemangioma likely partially sclerosed based on comparison with previous imaging. 3. Cirrhosis with stigmata of portal hypertension including splenomegaly and varices. 4. RIGHT colonic thickening greater than other areas of bowel edema may represent portal colopathy and changes of portal hypertension.  Triphasic CT liver 08/20/2019 1. Cirrhosis. Diffuse hepatic steatosis. 2. Indistinct 2.4 cm segment 7 right liver mass without arterial hyperenhancement, considered LR-3  (intermediate probability of malignancy). Recommend follow-up MRI (preferred) or CT abdomen without and with IV contrast in 3-6 months. 3. Small to moderate volume ascites. Mild anasarca. Small paraumbilical and gastroesophageal varices. Small splenorenal shunt. 4. Aortic Atherosclerosis (ICD10-I70.0).  Past Medical History:  Diagnosis Date   Acute encephalopathy 04/03/2015   Acute respiratory failure with hypoxia ([facility]) 04/03/2015   Acute stress disorder 04/08/2015   Alcohol abuse 08/16/2019   Alcohol intoxication ([place]) 04/03/2015   Alcohol use disorder, moderate, dependence ([place]) 02/15/2016   Alcohol withdrawal ([place]) 04/06/2015   Anxiety    CAP (community acquired pneumonia) 04/03/2015   Depression    Feeling grief    Hemothorax on left 04/09/2011   03/27/11 >Traumatic CT chest showed large left effusion with mediastinal shift, collapse of left lung, cannot exclude underlying mass . >>s/p Left  chest tube >3.5 liters of very bloody fluid >Pleural fluid showed inflammatory cells with no malignant cells noted.  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   ALCOHOL, OPIOID   Tobacco abuse 03/27/2011   Tobacco use disorder 04/03/2015    Past Surgical History:  Procedure Laterality Date   chest thoracostomy     IR PARACENTESIS  08/17/2019    Family History  Problem Relation Age of Onset   Depression Mother    Hypertension Mother    Colon cancer Neg Hx    Esophageal cancer  Neg Hx     Social History   Tobacco Use   Smoking status: Former Smoker    Packs/day: 0.50    Years: 10.00    Pack years: 5.00    Types:  Cigarettes    Quit date: 06/25/2019    Years since quitting: 0.4   Smokeless tobacco: Never Used  Vaping Use   Vaping Use: Never used  Substance Use Topics   Alcohol use: Not Currently    Alcohol/week: 3.0 standard drinks    Types: 3 Cans of beer per week    Comment: nightly   Drug use: Not Currently    Types: Marijuana    Current Outpatient Medications  Medication Sig Dispense Refill   citalopram (CELEXA) 20 MG tablet Take 1 tablet (20 mg total) by mouth daily. 30 tablet 11   furosemide (LASIX) 20 MG tablet Take 3 tablets (60 mg total) by mouth 2 (two) times daily. 540 tablet 0   lactulose (CHRONULAC) 10 GM/15ML solution Take 30 mLs (20 g total) by mouth 2 (two) times daily. 5400 mL 0   Melatonin 10 MG TABS Take 10 mg by mouth at bedtime as needed (sleep). 30 tablet 2   pantoprazole (PROTONIX) 40 MG tablet Take 1 tablet (40 mg total) by mouth daily. 90 tablet 0   spironolactone (ALDACTONE) 50 MG tablet Take 3 tablets (150 mg total) by mouth 2 (two) times daily. 540 tablet 0   traZODone (DESYREL) 50 MG tablet Take 1 tablet (50 mg total) by mouth at bedtime as needed for sleep. 30 tablet 0   No current facility-administered medications for this visit.    Allergies  Allergen Reactions   Molds & Smuts     Review of Systems:  neg     Physical Exam:    BP 134/88    Pulse 81    Ht 5\' 4"  (1.626 m)    Wt 143 lb 6 oz (65 kg)    BMI 24.61 kg/m  Wt Readings from Last 3 Encounters:  12/21/19 143 lb 6 oz (65 kg)  12/14/19 143 lb 6.4 oz (65 kg)  11/09/19 138 lb 12.8 oz (63 kg)   Constitutional:  Well-developed, in no acute distress. Psychiatric: Normal mood and affect. Behavior is normal. HEENT: Pupils normal.  Conjunctivae are normal. No scleral icterus. Neck supple.  Cardiovascular: Normal rate, regular rhythm. No edema Pulmonary/chest: Effort normal and breath sounds normal. No wheezing, rales or rhonchi. Abdominal: Soft, nondistended. Nontender. Bowel sounds  active throughout. There are no masses palpable. No hepatomegaly. Rectal: Deferred Neurological: Alert and oriented to person place and time. Skin: Skin is warm and dry. No rashes noted.  Data Reviewed: I have personally reviewed following labs and imaging studies  CBC: CBC Latest Ref Rng & Units 10/12/2019 08/25/2019 08/22/2019  WBC 3.4 - 10.8 x10E3/uL 4.1 6.3 6.7  Hemoglobin 11.1 - 15.9 g/dL 11.8 14.1 13.5  Hematocrit 34.0 - 46.6 % 32.2(L) 40.3 40.1  Platelets 150 - 450 x10E3/uL 123(L) 200 114(L)    CMP: CMP Latest Ref Rng & Units 10/12/2019 08/25/2019 08/22/2019  Glucose 65 - 99 mg/dL 73 108(H) 107(H)  BUN 6 - 24 mg/dL 2(L) 5(L) <5(L)  Creatinine 0.57 - 1.00 mg/dL 0.43(L) 0.56(L) 0.49  Sodium 134 - 144 mmol/L 136 133(L) 131(L)  Potassium 3.5 - 5.2 mmol/L 4.0 3.9 3.4(L)  Chloride 96 - 106 mmol/L 104 97 99  CO2 20 - 29 mmol/L 22 18(L) 24  Calcium 8.7 - 10.2 mg/dL 8.6(L) 8.3(L) 7.8(L)  Total Protein 6.0 - 8.5 g/dL 6.0 7.0 -  Total Bilirubin 0.0 - 1.2 mg/dL 1.6(H) 6.9(H) -  Alkaline Phos 48 - 121 IU/L 117 216(H) -  AST 0 - 40 IU/L 38 122(H) -  ALT 0 - 32 IU/L 15 32 -      Carmell Austria, MD 12/21/2019, 11:31 AM  Cc: Marianna Payment, MD

## 2019-12-22 ENCOUNTER — Telehealth: Payer: Self-pay

## 2019-12-22 LAB — PROTIME-INR
INR: 1.2 (ref 0.9–1.2)
Prothrombin Time: 12 s (ref 9.1–12.0)

## 2019-12-22 LAB — CBC WITH DIFFERENTIAL/PLATELET
Basophils Absolute: 0 10*3/uL (ref 0.0–0.2)
Basos: 1 %
EOS (ABSOLUTE): 0.1 10*3/uL (ref 0.0–0.4)
Eos: 1 %
Hematocrit: 32.8 % — ABNORMAL LOW (ref 34.0–46.6)
Hemoglobin: 11.7 g/dL (ref 11.1–15.9)
Immature Grans (Abs): 0 10*3/uL (ref 0.0–0.1)
Immature Granulocytes: 0 %
Lymphocytes Absolute: 1.1 10*3/uL (ref 0.7–3.1)
Lymphs: 20 %
MCH: 34 pg — ABNORMAL HIGH (ref 26.6–33.0)
MCHC: 35.7 g/dL (ref 31.5–35.7)
MCV: 95 fL (ref 79–97)
Monocytes Absolute: 0.7 10*3/uL (ref 0.1–0.9)
Monocytes: 14 %
Neutrophils Absolute: 3.4 10*3/uL (ref 1.4–7.0)
Neutrophils: 64 %
Platelets: 119 10*3/uL — ABNORMAL LOW (ref 150–450)
RBC: 3.44 x10E6/uL — ABNORMAL LOW (ref 3.77–5.28)
RDW: 13.1 % (ref 11.7–15.4)
WBC: 5.4 10*3/uL (ref 3.4–10.8)

## 2019-12-22 LAB — COMPREHENSIVE METABOLIC PANEL
ALT: 20 IU/L (ref 0–32)
AST: 35 IU/L (ref 0–40)
Albumin/Globulin Ratio: 1.3 (ref 1.2–2.2)
Albumin: 3.9 g/dL (ref 3.8–4.8)
Alkaline Phosphatase: 127 IU/L — ABNORMAL HIGH (ref 44–121)
BUN/Creatinine Ratio: 13 (ref 9–23)
BUN: 9 mg/dL (ref 6–24)
Bilirubin Total: 1.6 mg/dL — ABNORMAL HIGH (ref 0.0–1.2)
CO2: 25 mmol/L (ref 20–29)
Calcium: 9.2 mg/dL (ref 8.7–10.2)
Chloride: 91 mmol/L — ABNORMAL LOW (ref 96–106)
Creatinine, Ser: 0.71 mg/dL (ref 0.57–1.00)
GFR calc Af Amer: 116 mL/min/{1.73_m2} (ref >59)
GFR calc non Af Amer: 100 mL/min/{1.73_m2} (ref >59)
Globulin, Total: 3 g/dL (ref 1.5–4.5)
Glucose: 85 mg/dL (ref 65–99)
Potassium: 4.5 mmol/L (ref 3.5–5.2)
Sodium: 127 mmol/L — ABNORMAL LOW (ref 134–144)
Total Protein: 6.9 g/dL (ref 6.0–8.5)

## 2019-12-22 LAB — MAGNESIUM: Magnesium: 1.3 mg/dL — ABNORMAL LOW (ref 1.6–2.3)

## 2019-12-22 LAB — AFP TUMOR MARKER: AFP, Serum, Tumor Marker: 3.4 ng/mL (ref 0.0–8.3)

## 2019-12-22 NOTE — Telephone Encounter (Signed)
Patient has been scheduled for MRI on 12/28/19 at 7pm. I have attempted to contact patient regarding this appointment. Patient answered the phone and said "quit calling my damn phone". I tried to call patient back again and left a message for her to return my call regarding her appointment. I will attempt to call patient back at a later time.

## 2019-12-22 NOTE — Addendum Note (Signed)
Addended by: Karena Addison on: 12/22/2019 10:11 AM   Modules accepted: Orders

## 2019-12-22 NOTE — Addendum Note (Signed)
Addended by: Karena Addison on: 12/22/2019 10:43 AM   Modules accepted: Orders

## 2019-12-27 NOTE — Telephone Encounter (Signed)
I have attempted to contact patient again, unable to reach her or leave message.

## 2019-12-28 ENCOUNTER — Ambulatory Visit (HOSPITAL_COMMUNITY): Admission: RE | Admit: 2019-12-28 | Payer: Self-pay | Source: Ambulatory Visit

## 2019-12-29 ENCOUNTER — Encounter: Payer: Self-pay | Admitting: Gastroenterology

## 2020-01-12 MED FILL — FUROSEMIDE 40 MG TAB: 40 | 30 days supply | Qty: 60 | Fill #0

## 2020-01-12 MED FILL — GENERLAC SOLUTION 10G/15ML: 10 | 30 days supply | Qty: 1800 | Fill #1

## 2020-01-12 MED FILL — SPIRONOLACTONE 50 MG TABS: 50 | 30 days supply | Qty: 180 | Fill #1

## 2020-01-12 MED FILL — PANTOPRAZOLE SOD DR 40 MG T: 40 | 30 days supply | Qty: 30 | Fill #1

## 2020-01-12 MED FILL — CITALOPRAM HBR 20 MG TABLET: 20 | 30 days supply | Qty: 30 | Fill #1

## 2020-02-10 ENCOUNTER — Other Ambulatory Visit: Payer: Self-pay

## 2020-02-10 ENCOUNTER — Ambulatory Visit (HOSPITAL_COMMUNITY)
Admission: RE | Admit: 2020-02-10 | Discharge: 2020-02-10 | Disposition: A | Discharge status: Home / Self Care | Payer: Self-pay | Source: Ambulatory Visit | Attending: Gastroenterology | Admitting: Gastroenterology

## 2020-02-10 DIAGNOSIS — K7031 Alcoholic cirrhosis of liver with ascites: Secondary | ICD-10-CM | POA: Insufficient documentation

## 2020-02-10 IMAGING — MR MR ABDOMEN WO/W CM
18 series · 48 of 48 positions shown · IV contrast (gadavist)
Comparison: [DATE] CT abdomen.  [DATE] MRI abdomen.

CLINICAL DATA: Decompensated alcoholic cirrhosis with ascites.

EXAM:
MRI ABDOMEN WITHOUT AND WITH CONTRAST
TECHNIQUE: Multiplanar multisequence MR imaging of the abdomen was performed
both before and after the administration of intravenous contrast.
CONTRAST:  6mL GADAVIST GADOBUTROL 1 MMOL/ML IV SOLN

[Series 3: T2 · coronal · 6.0mm · 1.37mm/px · 2 of 33 slices shown (1 of 2)]
[im 1/33]
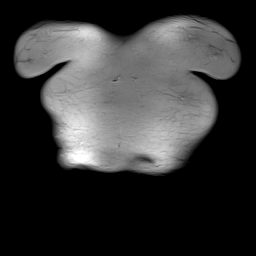
[im 33/33]
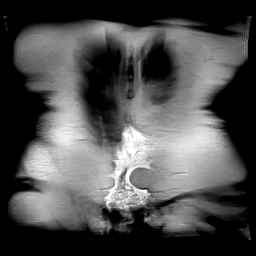

[Series 6: T1 · axial · 3.0mm · 1.09mm/px · z∈[-115,+122]mm · 4 of 80 slices shown (1 of 2)]
[im 1/80]
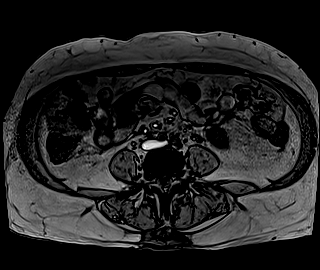
[im 27/80]
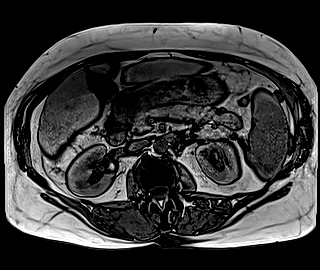
[im 53/80]
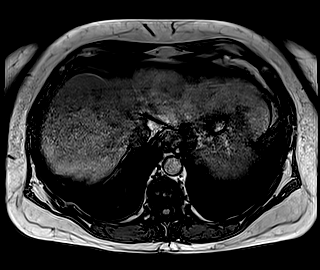
[im 80/80]
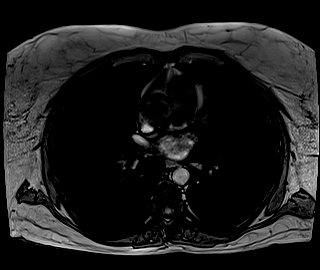

[Series 7: T1 · axial · 3.0mm · 1.09mm/px · z∈[-115,+122]mm · 4 of 80 slices shown (2 of 2)]
[im 1/80]
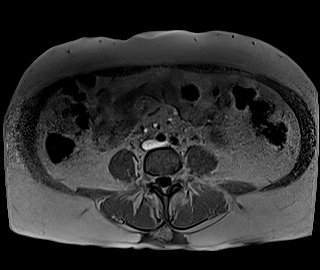
[im 27/80]
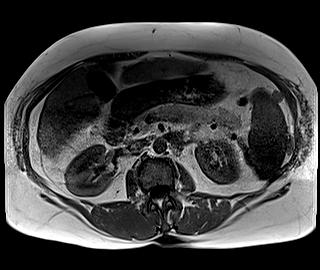
[im 53/80]
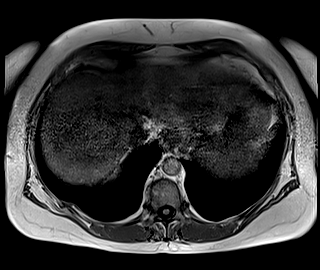
[im 80/80]
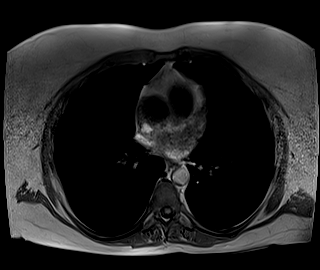

[Series 8: T2 fat-sat · axial · 6.0mm · 1.09mm/px · z∈[-114,+138]mm · 2 of 36 slices shown]
[im 1/36]
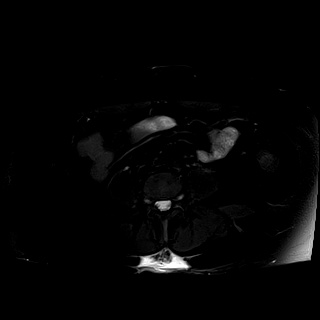
[im 36/36]
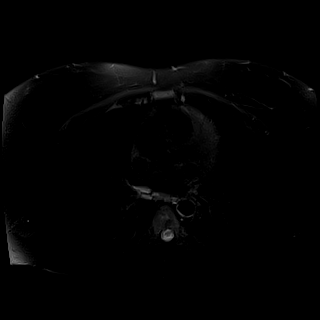

[Series 12: bSSFP · axial · 4.0mm · 0.68mm/px · z∈[-113,+123]mm · 2 of 60 slices shown]
[im 1/60]
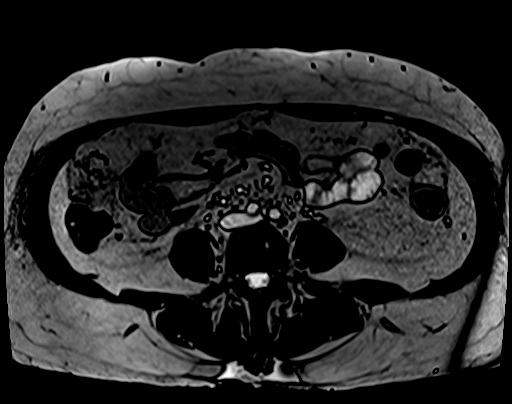
[im 60/60]
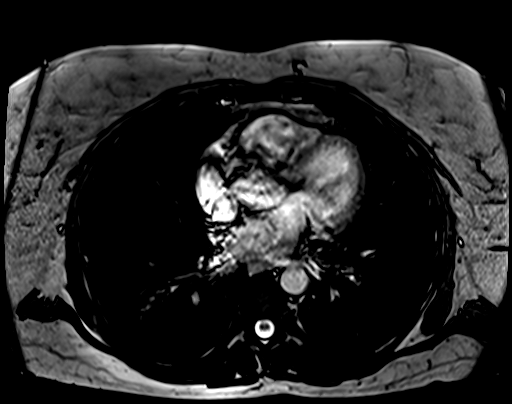

[Series 14: T1 dynamic · axial · 3.0mm · 1.09mm/px · z∈[-131,+130]mm · 3 of 88 slices shown (1 of 10)]
[im 1/88]
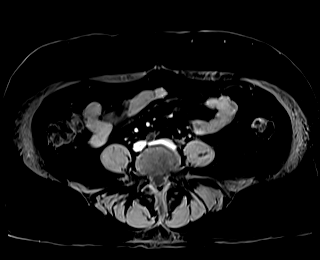
[im 44/88]
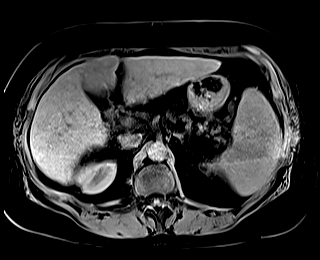
[im 88/88]
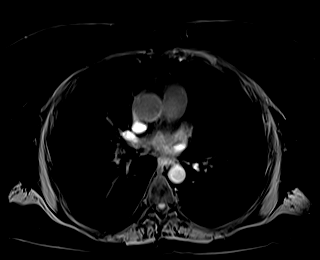

[Series 15: DWI · axial · 6.0mm · 1.36mm/px · z∈[-121,+131]mm · 3 of 72 slices shown (1 of 2)]
[im 1/72]
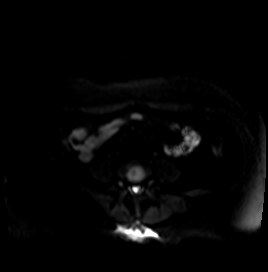
[im 36/72]
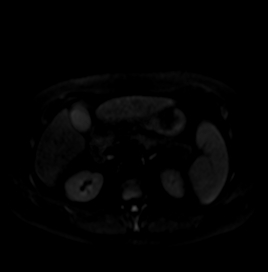
[im 72/72]
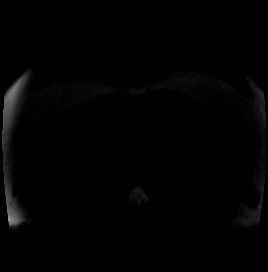

[Series 16: DWI · axial · 6.0mm · 1.36mm/px · 1 of 36 slices shown (2 of 2)]
[im 1/36]
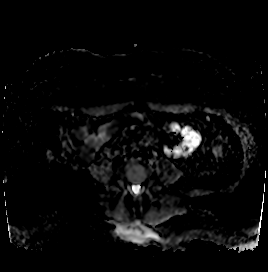

[Series 20: T1 dynamic · axial · 3.0mm · 1.09mm/px · z∈[-131,+130]mm · 3 of 88 slices shown (2 of 10)]
[im 1/88]
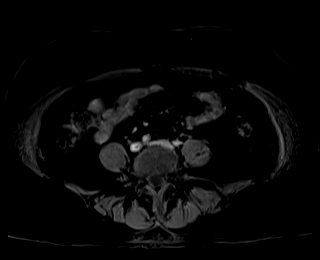
[im 44/88]
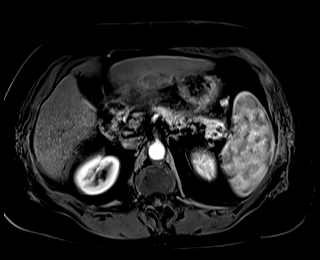
[im 88/88]
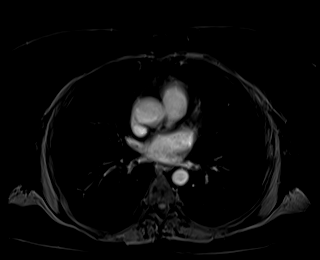

[Series 21: T1 dynamic · axial · 3.0mm · 1.09mm/px · z∈[-131,+130]mm · 3 of 88 slices shown (3 of 10)]
[im 1/88]
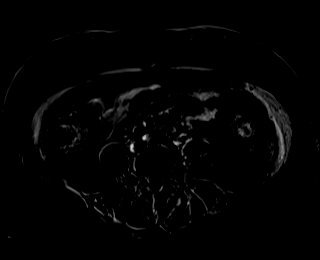
[im 44/88]
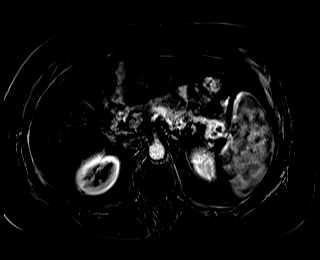
[im 88/88]
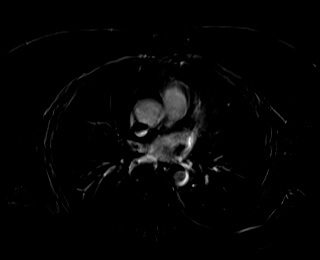

[Series 24: T1 dynamic · axial · 3.0mm · 1.09mm/px · z∈[-131,+130]mm · 3 of 88 slices shown (4 of 10)]
[im 1/88]
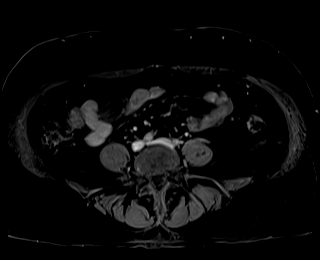
[im 44/88]
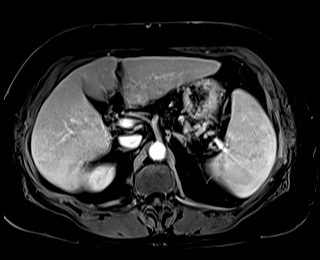
[im 88/88]
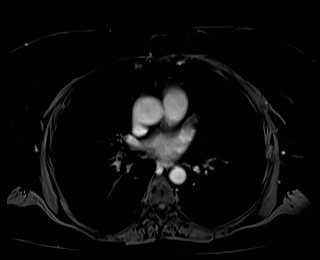

[Series 25: T1 dynamic · axial · 3.0mm · 1.09mm/px · z∈[-131,+130]mm · 3 of 88 slices shown (5 of 10)]
[im 1/88]
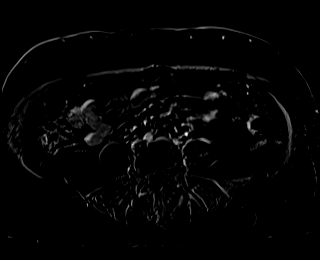
[im 44/88]
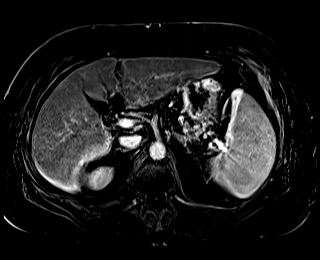
[im 88/88]
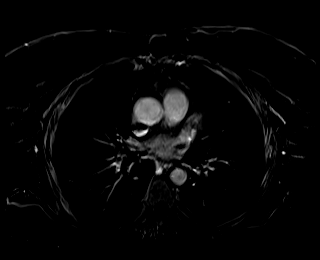

[Series 28: T1 dynamic · axial · 3.0mm · 1.09mm/px · z∈[-131,+130]mm · 3 of 88 slices shown (6 of 10)]
[im 1/88]
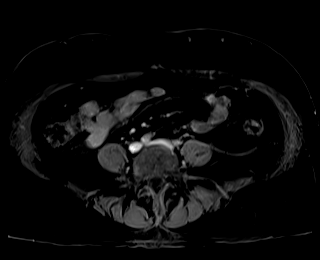
[im 44/88]
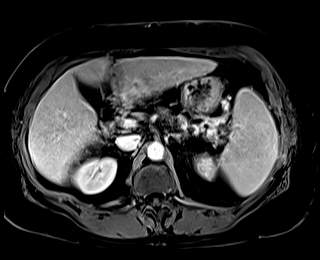
[im 88/88]
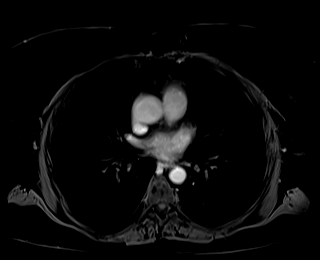

[Series 29: T1 dynamic · axial · 3.0mm · 1.09mm/px · z∈[-131,+130]mm · 3 of 88 slices shown (7 of 10)]
[im 1/88]
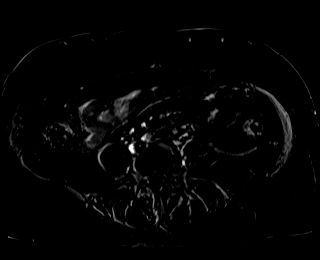
[im 44/88]
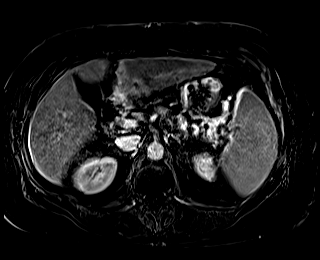
[im 88/88]
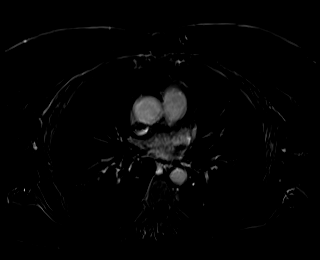

[Series 31: T1 dynamic · coronal · 4.0mm · 1.09mm/px · 2 of 60 slices shown (8 of 10)]
[im 1/60]
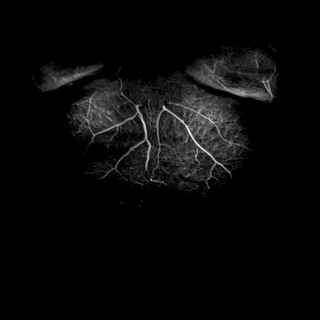
[im 60/60]
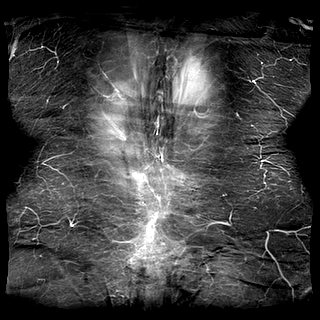

[Series 32: T2 · axial · 6.0mm · 1.37mm/px · 1 of 35 slices shown (2 of 2)]
[im 1/35]
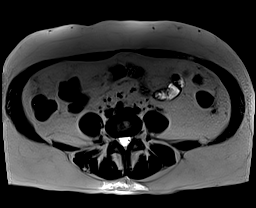

[Series 35: T1 dynamic · axial · 3.0mm · 1.09mm/px · z∈[-131,+130]mm · 3 of 88 slices shown (9 of 10)]
[im 1/88]
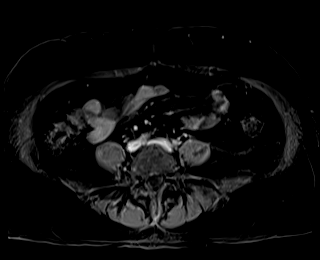
[im 44/88]
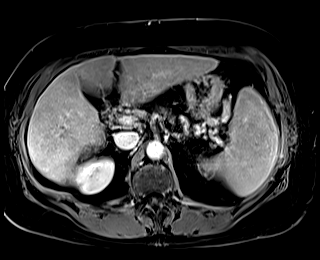
[im 88/88]
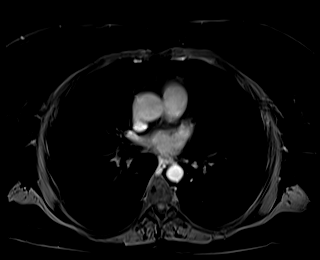

[Series 36: T1 dynamic · axial · 3.0mm · 1.09mm/px · z∈[-131,+130]mm · 3 of 88 slices shown (10 of 10)]
[im 1/88]
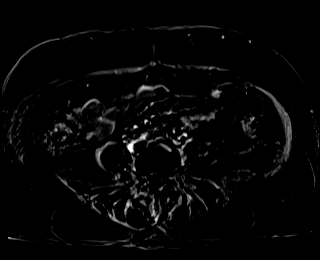
[im 44/88]
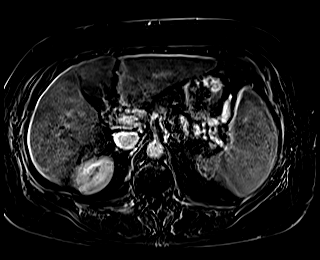
[im 88/88]
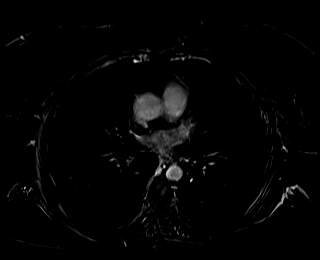

[48 of 48 positions shown; findings below may reference images not displayed]

FINDINGS: Lower chest: No acute abnormality at the lung bases. Stable bandlike
scarring in the dependent basilar right lower lobe.

Hepatobiliary: Diffusely irregular liver surface with mild relative
hypertrophy of the lateral segment left liver lobe, compatible with
cirrhosis. Patchy lace-like T2 hyperintensity throughout the liver,
most prominent in segment 4, compatible with confluent hepatic
fibrosis. No hepatic steatosis. Stable 3.5 cm segment 2 left liver
dome hemangioma (series 32/image 9). No additional liver masses.
Previously described vague hypodense 2.4 cm segment 7 right liver
lesion is absent on today's scan. No foci of arterial phase
hyperenhancement in the liver. Normal gallbladder with no
cholelithiasis. No biliary ductal dilatation. Common bile duct
diameter 3 mm. No choledocholithiasis. No biliary masses, strictures
or beading.

Pancreas: No pancreatic mass or duct dilation.  No pancreas divisum.

Spleen: Top normal size spleen. Craniocaudal splenic length 11.7 cm.
No splenic mass.

Adrenals/Urinary Tract: Normal adrenals. No hydronephrosis. Normal
kidneys with no renal mass.

Stomach/Bowel: Normal non-distended stomach. Visualized small and
large bowel is normal caliber, with no bowel wall thickening.

Vascular/Lymphatic: Normal caliber abdominal aorta. Patent portal,
splenic, hepatic and renal veins. Small gastroesophageal varices.
Moderate inferior perisplenic varices. No pathologically enlarged
lymph nodes in the abdomen.

Other: No abdominal ascites or focal fluid collection.

Musculoskeletal: No aggressive appearing focal osseous lesions.
IMPRESSION: 1. Cirrhosis. No suspicious liver masses.
2. Small gastroesophageal and moderate perisplenic varices. Top
normal size spleen. No ascites.

## 2020-02-10 MED ORDER — GADOBUTROL 1 MMOL/ML IV SOLN
6.0000 mL | Freq: Once | INTRAVENOUS | Status: AC | PRN
  Administered 2020-02-10: 08:00:00 6 mL via INTRAVENOUS

## 2020-02-15 ENCOUNTER — Telehealth: Payer: Self-pay | Admitting: Gastroenterology

## 2020-02-15 ENCOUNTER — Other Ambulatory Visit: Payer: Self-pay | Admitting: Student

## 2020-02-15 DIAGNOSIS — G4701 Insomnia due to medical condition: Secondary | ICD-10-CM

## 2020-02-15 MED FILL — PANTOPRAZOLE SOD DR 40 MG T: 40 | 30 days supply | Qty: 30 | Fill #2

## 2020-02-15 MED FILL — GENERLAC SOLUTION 10G/15ML: 10 | 30 days supply | Qty: 1800 | Fill #2

## 2020-02-15 MED FILL — CITALOPRAM HBR 20 MG TABLET: 20 | 30 days supply | Qty: 30 | Fill #2

## 2020-02-15 MED FILL — FUROSEMIDE 40 MG TAB: 40 | 30 days supply | Qty: 60 | Fill #1

## 2020-02-15 MED FILL — SPIRONOLACTONE 50 MG TABS: 50 | 30 days supply | Qty: 180 | Fill #2

## 2020-02-15 NOTE — Telephone Encounter (Signed)
Pt called inquiring about imaging results. She is aware that we will call her after Dr. Chales Abrahams reviews them.

## 2020-02-17 ENCOUNTER — Other Ambulatory Visit: Payer: Self-pay | Admitting: Internal Medicine

## 2020-02-17 DIAGNOSIS — G4701 Insomnia due to medical condition: Secondary | ICD-10-CM

## 2020-02-17 MED ORDER — TRAZODONE HCL 50 MG PO TABS
50.0000 mg | ORAL_TABLET | Freq: Every evening | ORAL | 0 refills | Status: DC | PRN
Start: 2020-02-17 — End: 2020-03-20

## 2020-02-17 MED FILL — traZODone HCL 50 MG TABS: 50 | 30 days supply | Qty: 30 | Fill #0

## 2020-02-18 ENCOUNTER — Encounter: Payer: Self-pay | Admitting: Gastroenterology

## 2020-02-25 ENCOUNTER — Encounter: Payer: Self-pay | Admitting: Internal Medicine

## 2020-02-25 ENCOUNTER — Other Ambulatory Visit: Payer: Self-pay | Admitting: Internal Medicine

## 2020-02-25 ENCOUNTER — Ambulatory Visit (INDEPENDENT_AMBULATORY_CARE_PROVIDER_SITE_OTHER): Payer: Self-pay | Admitting: Internal Medicine

## 2020-02-25 ENCOUNTER — Other Ambulatory Visit: Payer: Self-pay

## 2020-02-25 VITALS — BP 101/61 | HR 68 | Temp 97.6°F | Ht 64.0 in | Wt 159.0 lb

## 2020-02-25 DIAGNOSIS — M19049 Primary osteoarthritis, unspecified hand: Secondary | ICD-10-CM

## 2020-02-25 DIAGNOSIS — M19042 Primary osteoarthritis, left hand: Secondary | ICD-10-CM

## 2020-02-25 DIAGNOSIS — K7030 Alcoholic cirrhosis of liver without ascites: Secondary | ICD-10-CM

## 2020-02-25 DIAGNOSIS — M19041 Primary osteoarthritis, right hand: Secondary | ICD-10-CM

## 2020-02-25 MED ORDER — DICLOFENAC SODIUM 1 % EX GEL: 2.0000 g | Freq: Four times a day (QID) | CUTANEOUS | 4 refills | Status: DC

## 2020-02-25 MED FILL — DICLOFENAC SODIUM 1 % GEL: 1 | 12 days supply | Qty: 200 | Fill #0

## 2020-02-25 NOTE — Patient Instructions (Signed)
Thank you, Ms.Tyrena A Trebilcock for allowing [place] to provide your care today. Today we discussed arthritis and MRI results.    I have ordered the following labs for you:   Lab Orders     CMP14 + Anion Gap     Magnesium   Tests ordered today:  None  Referrals ordered today:   Referral Orders  No referral(s) requested today     I have ordered the following medication/changed the following medications:   Stop the following medications: There are no discontinued medications.   Start the following medications: Meds ordered this encounter  Medications  . diclofenac Sodium (VOLTAREN) 1 % GEL    Sig: Apply 2 g topically 4 (four) times daily. Apply to hands 4 time daily as needed for pain.    Dispense:  240 g    Refill:  4     Follow up: 6 months    Should you have any questions or concerns please call the internal medicine clinic at [phone].     Marianna Payment, D.O. [facility]

## 2020-02-25 NOTE — Progress Notes (Signed)
CC: Liver mass  HPI:  [name] is a 50 y.o. female with a past medical history stated below and presents today for liver mass. Please see problem based assessment and plan for additional details.  Past Medical History:  Diagnosis Date  . Acute encephalopathy 04/03/2015  . Acute respiratory failure with hypoxia ([place]) 04/03/2015  . Acute stress disorder 04/08/2015  . Alcohol abuse 08/16/2019  . Alcohol intoxication ([place]) 04/03/2015  . Alcohol use disorder, moderate, dependence ([place]) 02/15/2016  . Alcohol withdrawal ([place]) 04/06/2015  . Anxiety   . CAP (community acquired pneumonia) 04/03/2015  . Depression   . Feeling grief   . Hemothorax on left 04/09/2011   03/27/11 >Traumatic CT chest showed large left effusion with mediastinal shift, collapse of left lung, cannot exclude underlying mass . >>s/p Left  chest tube >3.5 liters of very bloody fluid >Pleural fluid showed inflammatory cells with no malignant cells noted.  Serial CXR w/ rexpansion 04/05/11>>will need follow up cxr for total clearance >>   . History of head injury 02/15/2016  . History of spinal fracture 02/15/2016  . Hypertension   . Macrocytosis without anemia 03/27/2011  . Overdose 04/03/2015  . Pleural effusion 03/27/2011  . Pneumonia 03/27/2011  . Recent bereavement 04/07/2015  . 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 Tobacco abuse 03/27/2011  . Tobacco use disorder 04/03/2015    Current Outpatient Medications on File Prior to Visit  Medication Sig Dispense Refill  . citalopram (CELEXA) 20 MG tablet Take 1 tablet (20 mg total) by mouth daily. 30 tablet 11  . furosemide (LASIX) 40 MG tablet Take 1 tablet (40 mg total) by mouth 2 (two) times daily. 60 tablet 3  . lactulose (CHRONULAC) 10 GM/15ML solution Take 30 mLs (20 g total) by mouth 2 (two) times daily. 5400 mL 0  . Melatonin 10 MG TABS Take 10 mg by mouth at bedtime as needed (sleep). 30 tablet 2  . pantoprazole  (PROTONIX) 40 MG tablet Take 1 tablet (40 mg total) by mouth daily. 90 tablet 0  . spironolactone (ALDACTONE) 100 MG tablet Take 1 tablet (100 mg total) by mouth 2 (two) times daily. 60 tablet 3  . traZODone (DESYREL) 50 MG tablet Take 1 tablet (50 mg total) by mouth at bedtime as needed for sleep. IM program 30 tablet 0   No current facility-administered medications on file prior to visit.    Family History  Problem Relation Age of Onset  . Depression Mother   . Hypertension Mother   . Colon cancer Neg Hx   . Esophageal cancer Neg Hx     Social History   Socioeconomic History  . Marital status: Widowed    Spouse name: Not on file  . Number of children: Not on file  . Years of education: Not on file  . Highest education level: Not on file  Occupational History  . Not on file  Tobacco Use  . Smoking status: Former Smoker    Packs/day: 0.50    Years: 10.00    Pack years: 5.00    Types: Cigarettes    Quit date: 06/25/2019    Years since quitting: 0.6  . Smokeless tobacco: Never Used  Vaping Use  . Vaping Use: Never used  Substance and Sexual Activity  . Alcohol use: Not Currently    Alcohol/week: 3.0 standard drinks    Types: 3 Cans of beer per week    Comment: nightly  .  Drug use: Not Currently    Types: Marijuana  . Sexual activity: Never  Other Topics Concern  . Not on file  Social History Narrative   Smoked "a few cigarettes a day" for many years up until 02/2011. Drinks "a few" drinks of alcohol on weekend, never until drunk or passing out.  Denies illicit drug use.  Stay at home mom.   Social Determinants of Health   Financial Resource Strain: Not on file  Food Insecurity: Not on file  Transportation Needs: Not on file  Physical Activity: Not on file  Stress: Not on file  Social Connections: Not on file  Intimate Partner Violence: Not on file    Review of Systems: ROS negative except for what is noted on the assessment and plan.  Vitals:   02/25/20  0949  BP: 101/61  Pulse: 68  Temp: 97.6 F (36.4 C)  TempSrc: Oral  SpO2: 100%  Weight: 159 lb (72.1 kg)  Height: 5\' 4"  (1.626 m)     Physical Exam: Physical Exam Constitutional:      Appearance: Normal appearance.  HENT:     Head: Normocephalic and atraumatic.  Eyes:     Extraocular Movements: Extraocular movements intact.  Cardiovascular:     Rate and Rhythm: Normal rate.     Pulses: Normal pulses.     Heart sounds: Normal heart sounds.  Pulmonary:     Effort: Pulmonary effort is normal.     Breath sounds: Normal breath sounds.  Abdominal:     General: Bowel sounds are normal.     Palpations: Abdomen is soft.     Tenderness: There is no abdominal tenderness.  Musculoskeletal:        General: Normal range of motion.     Cervical back: Normal range of motion.     Right lower leg: No edema.     Left lower leg: No edema.  Skin:    General: Skin is warm and dry.  Neurological:     Mental Status: She is alert and oriented to person, place, and time. Mental status is at baseline.  Psychiatric:        Mood and Affect: Mood normal.      Assessment & Plan:   See Encounters Tab for problem based charting.  Patient discussed with Dr. Lars Mage, D.O. [facility], PGY-2 Pager: [phone], Phone: [phone] Date 02/27/2020 Time 7:54 AM

## 2020-02-26 LAB — CMP14 + ANION GAP
ALT: 18 IU/L (ref 0–32)
AST: 34 IU/L (ref 0–40)
Albumin/Globulin Ratio: 1.5 (ref 1.2–2.2)
Albumin: 3.8 g/dL (ref 3.8–4.8)
Alkaline Phosphatase: 134 IU/L — ABNORMAL HIGH (ref 44–121)
Anion Gap: 13 mmol/L (ref 10.0–18.0)
BUN/Creatinine Ratio: 10 (ref 9–23)
BUN: 8 mg/dL (ref 6–24)
Bilirubin Total: 0.9 mg/dL (ref 0.0–1.2)
CO2: 22 mmol/L (ref 20–29)
Calcium: 9 mg/dL (ref 8.7–10.2)
Chloride: 101 mmol/L (ref 96–106)
Creatinine, Ser: 0.81 mg/dL (ref 0.57–1.00)
GFR calc Af Amer: 99 mL/min/{1.73_m2} (ref >59)
GFR calc non Af Amer: 86 mL/min/{1.73_m2} (ref >59)
Globulin, Total: 2.6 g/dL (ref 1.5–4.5)
Glucose: 110 mg/dL — ABNORMAL HIGH (ref 65–99)
Potassium: 4.2 mmol/L (ref 3.5–5.2)
Sodium: 136 mmol/L (ref 134–144)
Total Protein: 6.4 g/dL (ref 6.0–8.5)

## 2020-02-26 LAB — MAGNESIUM: Magnesium: 1.5 mg/dL — ABNORMAL LOW (ref 1.6–2.3)

## 2020-02-27 ENCOUNTER — Encounter: Payer: Self-pay | Admitting: Internal Medicine

## 2020-02-27 DIAGNOSIS — M19049 Primary osteoarthritis, unspecified hand: Secondary | ICD-10-CM | POA: Insufficient documentation

## 2020-02-27 NOTE — Assessment & Plan Note (Signed)
Patient presents for follow-up for liver mass.  Patient previously had a liver mass on the right lobe of the liver that was concerning for malignancy.  Repeat MRI shows no evidence of residual mass.  Patient is following with Dr. Lyndel Safe and anticipates having a endoscopy/colonoscopy in February.  She is tolerating her diuretics without any symptoms.  She states that her weight has mildly increased from 143-159 in the last 2 months without any overt signs of hypervolemia or ascites on exam.  Plan: -Encouraged patient to follow-up with Dr. Lyndel Safe for upper endoscopy and colonoscopy in the near future -continue diuretic therapy.

## 2020-02-27 NOTE — Assessment & Plan Note (Signed)
Patient presents with soreness in her bilateral joints of her hands.  There is no evidence of significant joint pathology but does have pain with movement.  No acute indication for imaging at this time.  Patient agrees to treat conservatively with Voltaren gel today.

## 2020-02-29 ENCOUNTER — Other Ambulatory Visit: Payer: Self-pay | Admitting: Internal Medicine

## 2020-02-29 MED ORDER — MAGNESIUM OXIDE 400 MG PO TABS: 400.0000 mg | ORAL_TABLET | Freq: Every day | ORAL | 0 refills | Status: AC

## 2020-02-29 MED ORDER — MAGNESIUM OXIDE 400 MG PO TABS
400.0000 mg | ORAL_TABLET | Freq: Every day | ORAL | 0 refills | Status: DC
Start: 2020-02-29 — End: 2020-02-29

## 2020-02-29 NOTE — Addendum Note (Signed)
Addended by: Lawerance Cruel on: 02/29/2020 09:18 AM   Modules accepted: Orders

## 2020-03-02 NOTE — Telephone Encounter (Signed)
Spoke to patient to go over her MRI results and further recommendations for EGD/Colonoscopy,then referral to [facility]. At first patient agreed with the plan. Then she stated she was having a panic attack and will get back with [place] to schedule at a later time.

## 2020-03-02 NOTE — Telephone Encounter (Signed)
Pt returned your call about her imaging results. She stated that she has not received your letter by email. I guided her on how to check the letter sent through my chart. She still would like to speak with you about if she needs a f/u ov. She stated that she would keep an eye on her phone to answer when we call.

## 2020-03-16 NOTE — Progress Notes (Signed)
Internal Medicine Clinic Attending  Case discussed with Dr. Coe  At the time of the visit.  We reviewed the resident's history and exam and pertinent patient test results.  I agree with the assessment, diagnosis, and plan of care documented in the resident's note.  

## 2020-03-20 ENCOUNTER — Other Ambulatory Visit: Payer: Self-pay | Admitting: Student

## 2020-03-20 ENCOUNTER — Other Ambulatory Visit: Payer: Self-pay

## 2020-03-20 DIAGNOSIS — M19041 Primary osteoarthritis, right hand: Secondary | ICD-10-CM

## 2020-03-20 DIAGNOSIS — K7031 Alcoholic cirrhosis of liver with ascites: Secondary | ICD-10-CM

## 2020-03-20 DIAGNOSIS — M19042 Primary osteoarthritis, left hand: Secondary | ICD-10-CM

## 2020-03-20 DIAGNOSIS — G4701 Insomnia due to medical condition: Secondary | ICD-10-CM

## 2020-03-20 MED ORDER — TRAZODONE HCL 50 MG PO TABS: 50.0000 mg | ORAL_TABLET | Freq: Every evening | ORAL | 0 refills | Status: DC | PRN

## 2020-03-20 MED ORDER — MELATONIN 10 MG PO TABS
10.0000 mg | ORAL_TABLET | Freq: Every evening | ORAL | 2 refills | Status: AC | PRN
Start: 2020-03-20 — End: ?

## 2020-03-20 MED ORDER — SPIRONOLACTONE 100 MG PO TABS: 100.0000 mg | ORAL_TABLET | Freq: Two times a day (BID) | ORAL | 3 refills | Status: DC

## 2020-03-20 MED ORDER — DICLOFENAC SODIUM 1 % EX GEL: 2.0000 g | Freq: Four times a day (QID) | CUTANEOUS | 4 refills | Status: DC

## 2020-03-20 MED ORDER — PANTOPRAZOLE SODIUM 40 MG PO TBEC
40.0000 mg | DELAYED_RELEASE_TABLET | Freq: Every day | ORAL | 0 refills | Status: DC
Start: 2020-03-20 — End: 2020-03-20

## 2020-03-20 MED ORDER — FUROSEMIDE 40 MG PO TABS: 40.0000 mg | ORAL_TABLET | Freq: Two times a day (BID) | ORAL | 3 refills | Status: DC

## 2020-03-20 NOTE — Telephone Encounter (Signed)
citalopram (CELEXA) 20 MG tablet  diclofenac Sodium (VOLTAREN) 1 % GEL  furosemide (LASIX) 40 MG tablet  Melatonin 10 MG TABS  pantoprazole (PROTONIX) 40 MG tablet(Expired)  spironolactone (ALDACTONE) 100 MG tablet  traZODone (DESYREL) 50 MG tablet, refill request @  [place], [place] - [address] [facility]. Phone:  [phone]  Fax:  [phone]     Pt states voltaren gel is not working. Please call pt back.

## 2020-03-21 ENCOUNTER — Other Ambulatory Visit: Payer: Self-pay | Admitting: Student

## 2020-03-21 MED ORDER — SPIRONOLACTONE 100 MG PO TABS
100.0000 mg | ORAL_TABLET | Freq: Two times a day (BID) | ORAL | 3 refills | Status: DC
Start: 2020-03-21 — End: 2020-03-21

## 2020-03-21 MED ORDER — FUROSEMIDE 40 MG PO TABS: 40.0000 mg | ORAL_TABLET | Freq: Two times a day (BID) | ORAL | 3 refills | Status: DC

## 2020-03-21 MED FILL — traZODone HCL 50 MG TABS: 50 | 30 days supply | Qty: 30 | Fill #0

## 2020-03-21 MED FILL — FUROSEMIDE 40 MG TAB: 40 | 30 days supply | Qty: 60 | Fill #0

## 2020-03-21 MED FILL — PANTOPRAZOLE SOD DR 40 MG T: 40 | 30 days supply | Qty: 30 | Fill #0

## 2020-03-21 MED FILL — DICLOFENAC SODIUM 1 % GEL: 1 | 25 days supply | Qty: 200 | Fill #0

## 2020-03-21 MED FILL — spIRONOLACTONE 100 MG TAB: 100 | 30 days supply | Qty: 60 | Fill #0

## 2020-03-21 NOTE — Addendum Note (Signed)
Addended by: Paulla Dolly on: 03/21/2020 11:39 AM   Modules accepted: Orders

## 2020-03-23 ENCOUNTER — Other Ambulatory Visit: Payer: Self-pay | Admitting: Student

## 2020-03-23 DIAGNOSIS — K7031 Alcoholic cirrhosis of liver with ascites: Secondary | ICD-10-CM

## 2020-03-24 MED FILL — CITALOPRAM HBR 20 MG TABLET: 20 | 30 days supply | Qty: 30 | Fill #3

## 2020-03-28 ENCOUNTER — Other Ambulatory Visit: Payer: Self-pay | Admitting: Student

## 2020-03-28 MED ORDER — LACTULOSE ENCEPHALOPATHY 10 GM/15ML PO SOLN: ORAL | 0 refills | Status: DC

## 2020-03-28 MED FILL — GENERLAC SOLUTION 10G/15ML: 10 | 30 days supply | Qty: 1800 | Fill #0

## 2020-03-28 NOTE — Addendum Note (Signed)
Addended by: Paulla Dolly on: 03/28/2020 02:09 PM   Modules accepted: Orders

## 2020-04-26 ENCOUNTER — Other Ambulatory Visit: Payer: Self-pay | Admitting: Student

## 2020-04-26 ENCOUNTER — Other Ambulatory Visit: Payer: Self-pay | Admitting: Internal Medicine

## 2020-04-26 DIAGNOSIS — G4701 Insomnia due to medical condition: Secondary | ICD-10-CM

## 2020-05-13 ENCOUNTER — Other Ambulatory Visit (HOSPITAL_COMMUNITY): Payer: Self-pay

## 2020-05-25 ENCOUNTER — Other Ambulatory Visit: Payer: Self-pay | Admitting: Internal Medicine

## 2020-05-25 ENCOUNTER — Other Ambulatory Visit (HOSPITAL_COMMUNITY): Payer: Self-pay

## 2020-05-25 DIAGNOSIS — G4701 Insomnia due to medical condition: Secondary | ICD-10-CM

## 2020-05-25 MED ORDER — TRAZODONE HCL 50 MG PO TABS
50.0000 mg | ORAL_TABLET | Freq: Every evening | ORAL | 0 refills | Status: DC | PRN
  Filled 2020-05-25: qty 30, fill #0

## 2020-05-25 MED FILL — Pantoprazole Sodium EC Tab 40 MG (Base Equiv): ORAL | 30 days supply | Qty: 30 | Fill #0 | Status: AC

## 2020-05-25 MED FILL — Lactulose (Encephalopathy) Solution 10 GM/15ML: ORAL | 30 days supply | Qty: 1800 | Fill #0 | Status: AC

## 2020-05-25 MED FILL — Citalopram Hydrobromide Tab 20 MG (Base Equiv): ORAL | 30 days supply | Qty: 30 | Fill #0 | Status: AC

## 2020-05-25 MED FILL — Spironolactone Tab 100 MG: ORAL | 30 days supply | Qty: 60 | Fill #0 | Status: AC

## 2020-05-25 MED FILL — Furosemide Tab 40 MG: ORAL | 30 days supply | Qty: 60 | Fill #0 | Status: AC

## 2020-05-25 MED FILL — Diclofenac Sodium Gel 1% (1.16% Diethylamine Equiv): CUTANEOUS | 25 days supply | Qty: 200 | Fill #0 | Status: AC

## 2020-05-26 ENCOUNTER — Other Ambulatory Visit (HOSPITAL_COMMUNITY): Payer: Self-pay

## 2020-05-26 ENCOUNTER — Other Ambulatory Visit: Payer: Self-pay | Admitting: Internal Medicine

## 2020-05-26 DIAGNOSIS — G4701 Insomnia due to medical condition: Secondary | ICD-10-CM

## 2020-05-30 ENCOUNTER — Other Ambulatory Visit (HOSPITAL_COMMUNITY): Payer: Self-pay

## 2020-05-30 MED ORDER — TRAZODONE HCL 50 MG PO TABS
50.0000 mg | ORAL_TABLET | Freq: Every evening | ORAL | 0 refills | Status: DC | PRN
  Filled 2020-05-30: qty 30, 30d supply, fill #0

## 2020-05-31 ENCOUNTER — Other Ambulatory Visit (HOSPITAL_COMMUNITY): Payer: Self-pay

## 2020-06-02 ENCOUNTER — Other Ambulatory Visit (HOSPITAL_COMMUNITY): Payer: Self-pay

## 2020-06-19 ENCOUNTER — Other Ambulatory Visit: Payer: Self-pay | Admitting: Internal Medicine

## 2020-06-19 ENCOUNTER — Other Ambulatory Visit: Payer: Self-pay | Admitting: Student

## 2020-06-19 DIAGNOSIS — G4701 Insomnia due to medical condition: Secondary | ICD-10-CM

## 2020-06-19 DIAGNOSIS — K7031 Alcoholic cirrhosis of liver with ascites: Secondary | ICD-10-CM

## 2020-06-19 MED FILL — Spironolactone Tab 100 MG: ORAL | 30 days supply | Qty: 60 | Fill #1 | Status: AC

## 2020-06-19 MED FILL — Citalopram Hydrobromide Tab 20 MG (Base Equiv): ORAL | 30 days supply | Qty: 30 | Fill #1 | Status: AC

## 2020-06-19 MED FILL — Furosemide Tab 40 MG: ORAL | 30 days supply | Qty: 60 | Fill #1 | Status: AC

## 2020-06-19 MED FILL — Diclofenac Sodium Gel 1% (1.16% Diethylamine Equiv): CUTANEOUS | 25 days supply | Qty: 200 | Fill #1 | Status: AC

## 2020-06-20 ENCOUNTER — Other Ambulatory Visit: Payer: Self-pay | Admitting: Internal Medicine

## 2020-06-20 ENCOUNTER — Other Ambulatory Visit (HOSPITAL_COMMUNITY): Payer: Self-pay

## 2020-06-20 DIAGNOSIS — K7031 Alcoholic cirrhosis of liver with ascites: Secondary | ICD-10-CM

## 2020-06-20 MED ORDER — PANTOPRAZOLE SODIUM 40 MG PO TBEC
40.0000 mg | DELAYED_RELEASE_TABLET | Freq: Every day | ORAL | 0 refills | Status: DC
  Filled 2020-06-20: qty 30, 30d supply, fill #0

## 2020-06-20 MED ORDER — LACTULOSE ENCEPHALOPATHY 10 GM/15ML PO SOLN
20.0000 g | Freq: Two times a day (BID) | ORAL | 0 refills | Status: DC
  Filled 2020-06-20: qty 1800, 30d supply, fill #0

## 2020-06-20 MED ORDER — TRAZODONE HCL 50 MG PO TABS
50.0000 mg | ORAL_TABLET | Freq: Every evening | ORAL | 0 refills | Status: DC | PRN
  Filled 2020-06-20 – 2020-06-21 (×2): qty 30, 30d supply, fill #0

## 2020-06-20 NOTE — Telephone Encounter (Signed)
Last visit:  02/25/20 Next visit: per last visit-due in 08/2020

## 2020-06-21 ENCOUNTER — Other Ambulatory Visit: Payer: Self-pay | Admitting: Student

## 2020-06-21 ENCOUNTER — Other Ambulatory Visit (HOSPITAL_COMMUNITY): Payer: Self-pay

## 2020-06-22 ENCOUNTER — Other Ambulatory Visit (HOSPITAL_COMMUNITY): Payer: Self-pay

## 2020-07-19 NOTE — Patient Instructions (Addendum)
[name],   Thank you for your visit to the [facility] today. It was a pleasure meeting  you. Today we discussed the following:  1) Liver cirrhosis - Great job abstaining from alcohol! - I am checking blood work today and will call you with the results - Please schedule follow-up with Dr. Lyndel Safe for at least the upper endoscopy.   2) I refilled all of your medications today.    Please schedule follow-up with Dr. Marianna Payment in 3 months. Please bring all of your medications with you.   If you have any questions or concerns, please call our clinic at [phone] between 9am-5pm. Outside of these hours, call [phone] and ask for the internal medicine resident on call. If you feel you are having a medical emergency please call 911.

## 2020-07-19 NOTE — Progress Notes (Addendum)
Office Visit   Patient ID: Kayla Sandoval, female    DOB: 1970-04-21, 50 y.o.   MRN: [id number]   PCP: Kayla Payment, MD   Subjective:   CC: pap smear, medication refills  HPI:  Kayla Sandoval is a 50 y.o. woman with history as below who presents to clinic for pap smear and medication refills. Her last clinic visit was on 02/25/20.   To see the details of this patient's management of their acute and chronic problems, please refer to the Assessment & Plan under the Encounters tab.    Review of Systems:   Review of Systems  Constitutional:  Negative for chills, fever, malaise/fatigue and weight loss.  Eyes:  Negative for blurred vision.  Respiratory:  Negative for cough and shortness of breath.   Cardiovascular:  Negative for chest pain, palpitations and leg swelling.  Gastrointestinal:  Negative for abdominal pain.  Neurological:  Negative for dizziness, weakness and headaches.  Psychiatric/Behavioral:  The patient is nervous/anxious.    Past Medical History:  Diagnosis Date   Acute encephalopathy 04/03/2015   Acute respiratory failure with hypoxia ([facility]) 04/03/2015   Acute stress disorder 04/08/2015   Alcohol abuse 08/16/2019   Alcohol intoxication ([place]) 04/03/2015   Alcohol use disorder, moderate, dependence ([place]) 02/15/2016   Alcohol withdrawal ([place]) 04/06/2015   Anxiety    CAP (community acquired pneumonia) 04/03/2015   Depression    Feeling grief    Hemothorax on left 04/09/2011   03/27/11 >Traumatic CT chest showed large left effusion with mediastinal shift, collapse of left lung, cannot exclude underlying mass . >>s/p Left  chest tube >3.5 liters of very bloody fluid >Pleural fluid showed inflammatory cells with no malignant cells noted.  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 30 tablet 2   citalopram (CELEXA) 20 MG tablet TAKE 1 TABLET BY MOUTH ONCE DAILY. 30 tablet 11   diclofenac Sodium (VOLTAREN) 1 % GEL APPLY 2 G TOPICALLY 4 (FOUR) TIMES DAILY. APPLY TO HANDS 4 TIMES DAILY AS NEEDED FOR PAIN. 200 g 4   furosemide (LASIX) 40 MG tablet TAKE 1 TABLET BY MOUTH TWICE DAILY. 60 tablet 3   lactulose, encephalopathy, (CHRONULAC) 10 GM/15ML SOLN TAKE 30 MLS BY MOUTH TWICE DAILY. 5400 mL 0   lactulose, encephalopathy, (CHRONULAC) 10 GM/15ML SOLN TAKE 30 MLS (20 G TOTAL) BY MOUTH 2 (TWO) TIMES DAILY. 1800 mL 0   lactulose, encephalopathy, (GENERLAC) 10 GM/15ML SOLN Take 30 mLs (20 g total) by mouth 2 (two) times daily. 1800 mL 0   Melatonin 10 MG CAPS TAKE 10 MG BY MOUTH AT BEDTIME AS NEEDED (SLEEP). 30 capsule 2   pantoprazole (PROTONIX) 40 MG tablet TAKE 1 TABLET (40 MG TOTAL) BY MOUTH DAILY. 30 tablet 0   spironolactone (ALDACTONE) 100 MG tablet TAKE 1 TABLET BY MOUTH TWICE DAILY. 60 tablet 3  traZODone (DESYREL) 50 MG tablet Take 1 tablet (50 mg total) by mouth at bedtime as needed for sleep. 30 tablet 0   No facility-administered medications prior to visit.   Objective:   BP 129/84 (BP Location: Right Arm, Patient Position: Sitting, Cuff Size: Small)   Pulse 93   Temp 98.1 F (36.7 C) (Oral)   Ht 5\' 3"  (1.6 m)   Wt 142 lb 4.8 oz (64.5 kg)   SpO2 97%   BMI 25.21 kg/m  Wt Readings from Last 3 Encounters:  07/20/20 142 lb 4.8 oz (64.5 kg)  02/25/20 159 lb (72.1 kg)  12/21/19 143 lb 6 oz (65 kg)   BP Readings from Last 3 Encounters:  07/20/20 129/84  02/25/20  101/61  12/21/19 134/88   Constitutional: chronically ill appearing woman sitting in chair, in no acute distress HENT: normocephalic atraumatic, mucous membranes moist Eyes: conjunctiva non-erythematous, no scleral icterus Neck: supple Cardiovascular: regular rate and rhythm, no m/r/g, no lower extremity edema Pulmonary/Chest: normal work of breathing on room air, lungs clear to auscultation bilaterally Abdominal: soft, non-tender, non-distended MSK: normal bulk and tone Neurological: alert & oriented x 3, normal gait Skin: warm and dry, scattered spider angiomas on chest Psych: anxious and tearful at first, less so after decision to not proceed with pap smear GU: patient declined  Health Maintenance:   Health Maintenance  Topic Date Due   COVID-19 Vaccine (1) Never done   Pneumococcal Vaccine 83-50 Years old (1 - PCV) Never done   TETANUS/TDAP  Never done   Zoster Vaccines- Shingrix (1 of 2) Never done   COLONOSCOPY (Pts 45-82yrs Insurance coverage will need to be confirmed)  Never done   MAMMOGRAM  Never done   PAP SMEAR-Modifier  07/12/2021 (Originally 04/14/1991)   INFLUENZA VACCINE  09/11/2020   Hepatitis C Screening  Completed   HIV Screening  Completed   HPV VACCINES  Aged Out     Assessment & Plan:   Problem List Items Addressed This Visit       Digestive   Cirrhosis with alcoholism ([place]) (Chronic)    Patient reports that she is doing well on her cirrhosis medication regimen. Endorses adherence to spironolactone 100 mg BID, lasix 40 mg twice daily, protonix 40 mg daily, and lactulose 20 g BID as prescribed. She endorses persistent mild abdominal bloating as well as mild nausea with occasional vomiting. She states she continues to abstain from alcohol use. States her weight has been above what she considers to be her ideal weight. Today she is 142 lb whereas she feels best ~135 lb. She reports she has not followed up with Dr. Lyndel Safe (GI) recently. Per chart review, she was  urged to schedule follow-up with him at her appointment in 02/2020.   On exam, she appears comfortable and there are no overt signs of hypervolemia or ascites on exam.   We had a long discussion regarding the importance of at least EGD to screen for and possibly treat esophageal varices. Of note, esophageal varices were seen on MRI in 01/2020.   Plan: - Continue current medication regimen - Follow-up with GI Lyndel Safe) for at least EGD - Continue to abstain from alcohol use - CBC, CMP, Mg, PT/INR  ADDENDUM:  - Based on the above lab work, current MELD score 11, or 6% 56-month mortality. [inputs: Cr 0.59, Tbili 1.8, INR 1.2, Na 135] - Mg low at 1.2, decreased from 1.5 at last check. Patient reported during visit she has been taking daily  OTC Mg supplement. Will discuss result with patient and send in for magnesium repletion.       Relevant Medications   furosemide (LASIX) 40 MG tablet   lactulose, encephalopathy, (GENERLAC) 10 GM/15ML SOLN   pantoprazole (PROTONIX) 40 MG tablet   spironolactone (ALDACTONE) 100 MG tablet     Other   Depression    Patient states she is doing well on her Celexa 20 mg daily and trazodone QHS PRN. She states her anxiety and tearfulness today are specifically related to the idea of a pap smear. She declines referral to our [facility] counselor because she states her best friend is a Engineer, water and she has adequate social and emotional support.  - Celexa and trazodone refilled       Relevant Medications   citalopram (CELEXA) 20 MG tablet   traZODone (DESYREL) 50 MG tablet   Insomnia   Relevant Medications   traZODone (DESYREL) 50 MG tablet   Pap smear of cervix declined - Primary    Patient presents today for pap smear. She cannot recall when her last pap smear was. She previously declined pap smear with PCP because she prefers a female provider. However, upon meeting patient and discussing procedure, she immediately became tearful and expressed anxiety regarding  the procedure. When asked why she is anxious, she becomes even more tearful and states she wants to leave. She denies prior trauma related to pap smear or otherwise. States she has not been sexually active in many years since her husband passed away. She also explains she is fearful of the result. Left exam room for patient to redress, and upon returning, she was much more calm.   Discussed with patient that although cervical cancer screening is very important, with her history of alcoholic cirrhosis, screening for esophageal varices is arguably going to be more important for her.  Plan: - discuss pap smear at future visits       Other Visit Diagnoses     Osteoarthritis of both hands, unspecified osteoarthritis type       Relevant Medications   diclofenac Sodium (VOLTAREN) 1 % GEL   Alcoholic cirrhosis of liver with ascites (HCC)       Relevant Medications   furosemide (LASIX) 40 MG tablet   lactulose, encephalopathy, (GENERLAC) 10 GM/15ML SOLN   pantoprazole (PROTONIX) 40 MG tablet   spironolactone (ALDACTONE) 100 MG tablet   Other Relevant Orders   CMP14 + Anion Gap (Completed)   Magnesium (Completed)   CBC with Diff (Completed)   Protime-INR (Completed)        Return in about 3 months (around 10/20/2020).   Pt discussed with Dr. Philipp Ovens.  [place], MD Internal Medicine Resident, PGY-1 [facility] Pager: [phone] 4:10 PM, 07/22/2020

## 2020-07-20 ENCOUNTER — Other Ambulatory Visit (HOSPITAL_COMMUNITY): Payer: Self-pay

## 2020-07-20 ENCOUNTER — Other Ambulatory Visit: Payer: Self-pay

## 2020-07-20 ENCOUNTER — Ambulatory Visit (INDEPENDENT_AMBULATORY_CARE_PROVIDER_SITE_OTHER): Payer: Self-pay | Admitting: Student

## 2020-07-20 ENCOUNTER — Encounter: Payer: Self-pay | Admitting: Student

## 2020-07-20 ENCOUNTER — Ambulatory Visit: Payer: Self-pay | Admitting: Dermatology

## 2020-07-20 VITALS — BP 129/84 | HR 93 | Temp 98.1°F | Ht 63.0 in | Wt 142.3 lb

## 2020-07-20 DIAGNOSIS — M19041 Primary osteoarthritis, right hand: Secondary | ICD-10-CM

## 2020-07-20 DIAGNOSIS — K7031 Alcoholic cirrhosis of liver with ascites: Secondary | ICD-10-CM

## 2020-07-20 DIAGNOSIS — G4701 Insomnia due to medical condition: Secondary | ICD-10-CM

## 2020-07-20 DIAGNOSIS — K7030 Alcoholic cirrhosis of liver without ascites: Secondary | ICD-10-CM

## 2020-07-20 DIAGNOSIS — Z5320 Procedure and treatment not carried out because of patient's decision for unspecified reasons: Secondary | ICD-10-CM

## 2020-07-20 DIAGNOSIS — M19042 Primary osteoarthritis, left hand: Secondary | ICD-10-CM

## 2020-07-20 DIAGNOSIS — F32A Depression, unspecified: Secondary | ICD-10-CM

## 2020-07-20 LAB — PROTIME-INR
INR: 1.2 (ref 0.8–1.2)
Prothrombin Time: 14.8 seconds (ref 11.4–15.2)

## 2020-07-20 MED ORDER — DICLOFENAC SODIUM 1 % EX GEL
2.0000 g | Freq: Four times a day (QID) | CUTANEOUS | 4 refills | Status: DC | PRN
  Filled 2020-07-20: qty 200, 25d supply, fill #0
  Filled 2020-09-02: qty 200, 25d supply, fill #1
  Filled 2020-10-27: qty 200, 25d supply, fill #2
  Filled 2020-11-29: qty 200, 25d supply, fill #3
  Filled 2021-01-08: qty 200, 25d supply, fill #4

## 2020-07-20 MED ORDER — SPIRONOLACTONE 100 MG PO TABS
100.0000 mg | ORAL_TABLET | Freq: Two times a day (BID) | ORAL | 3 refills | Status: DC
  Filled 2020-07-20: qty 60, 30d supply, fill #0
  Filled 2020-09-02: qty 60, 30d supply, fill #1
  Filled 2020-10-27: qty 60, 30d supply, fill #2
  Filled 2020-11-29: qty 60, 30d supply, fill #3

## 2020-07-20 MED ORDER — TRAZODONE HCL 50 MG PO TABS
50.0000 mg | ORAL_TABLET | Freq: Every evening | ORAL | 0 refills | Status: DC | PRN
  Filled 2020-07-20: qty 30, 30d supply, fill #0

## 2020-07-20 MED ORDER — CITALOPRAM HYDROBROMIDE 20 MG PO TABS
20.0000 mg | ORAL_TABLET | Freq: Every day | ORAL | 11 refills | Status: DC
  Filled 2020-07-20: qty 30, 30d supply, fill #0
  Filled 2020-09-02: qty 30, 30d supply, fill #1
  Filled 2020-10-27: qty 30, 30d supply, fill #2
  Filled 2020-11-29: qty 30, 30d supply, fill #3
  Filled 2021-01-08: qty 30, 30d supply, fill #4
  Filled 2021-03-09: qty 30, 30d supply, fill #5
  Filled 2021-04-19: qty 30, 30d supply, fill #6
  Filled 2021-06-07: qty 30, 30d supply, fill #7
  Filled 2021-07-05: qty 30, 30d supply, fill #8

## 2020-07-20 MED ORDER — PANTOPRAZOLE SODIUM 40 MG PO TBEC
40.0000 mg | DELAYED_RELEASE_TABLET | Freq: Every day | ORAL | 0 refills | Status: DC
  Filled 2020-07-20: qty 30, 30d supply, fill #0

## 2020-07-20 MED ORDER — LACTULOSE ENCEPHALOPATHY 10 GM/15ML PO SOLN
20.0000 g | Freq: Two times a day (BID) | ORAL | 0 refills | Status: DC
  Filled 2020-07-20: qty 1800, 30d supply, fill #0

## 2020-07-20 MED ORDER — FUROSEMIDE 40 MG PO TABS
40.0000 mg | ORAL_TABLET | Freq: Two times a day (BID) | ORAL | 3 refills | Status: DC
  Filled 2020-07-20: qty 60, 30d supply, fill #0
  Filled 2020-09-02: qty 60, 30d supply, fill #1
  Filled 2020-10-27: qty 60, 30d supply, fill #2
  Filled 2020-11-29: qty 60, 30d supply, fill #3

## 2020-07-21 LAB — CBC WITH DIFFERENTIAL/PLATELET
Basophils Absolute: 0 10*3/uL (ref 0.0–0.2)
Basos: 0 %
EOS (ABSOLUTE): 0.1 10*3/uL (ref 0.0–0.4)
Eos: 1 %
Hematocrit: 42.9 % (ref 34.0–46.6)
Hemoglobin: 15.3 g/dL (ref 11.1–15.9)
Immature Grans (Abs): 0 10*3/uL (ref 0.0–0.1)
Immature Granulocytes: 0 %
Lymphocytes Absolute: 0.8 10*3/uL (ref 0.7–3.1)
Lymphs: 14 %
MCH: 36.3 pg — ABNORMAL HIGH (ref 26.6–33.0)
MCHC: 35.7 g/dL (ref 31.5–35.7)
MCV: 102 fL — ABNORMAL HIGH (ref 79–97)
Monocytes Absolute: 0.6 10*3/uL (ref 0.1–0.9)
Monocytes: 11 %
Neutrophils Absolute: 4.3 10*3/uL (ref 1.4–7.0)
Neutrophils: 74 %
Platelets: 103 10*3/uL — ABNORMAL LOW (ref 150–450)
RBC: 4.22 x10E6/uL (ref 3.77–5.28)
RDW: 13.8 % (ref 11.7–15.4)
WBC: 5.9 10*3/uL (ref 3.4–10.8)

## 2020-07-21 LAB — CMP14 + ANION GAP
ALT: 27 IU/L (ref 0–32)
AST: 53 IU/L — ABNORMAL HIGH (ref 0–40)
Albumin/Globulin Ratio: 1.4 (ref 1.2–2.2)
Albumin: 4.1 g/dL (ref 3.8–4.8)
Alkaline Phosphatase: 144 IU/L — ABNORMAL HIGH (ref 44–121)
Anion Gap: 18 mmol/L (ref 10.0–18.0)
BUN/Creatinine Ratio: 8 — ABNORMAL LOW (ref 9–23)
BUN: 5 mg/dL — ABNORMAL LOW (ref 6–24)
Bilirubin Total: 1.8 mg/dL — ABNORMAL HIGH (ref 0.0–1.2)
CO2: 22 mmol/L (ref 20–29)
Calcium: 9 mg/dL (ref 8.7–10.2)
Chloride: 95 mmol/L — ABNORMAL LOW (ref 96–106)
Creatinine, Ser: 0.59 mg/dL (ref 0.57–1.00)
Globulin, Total: 2.9 g/dL (ref 1.5–4.5)
Glucose: 108 mg/dL — ABNORMAL HIGH (ref 65–99)
Potassium: 3.5 mmol/L (ref 3.5–5.2)
Sodium: 135 mmol/L (ref 134–144)
Total Protein: 7 g/dL (ref 6.0–8.5)
eGFR: 110 mL/min/{1.73_m2} (ref >59)

## 2020-07-21 LAB — MAGNESIUM: Magnesium: 1.2 mg/dL — ABNORMAL LOW (ref 1.6–2.3)

## 2020-07-22 DIAGNOSIS — Z5320 Procedure and treatment not carried out because of patient's decision for unspecified reasons: Secondary | ICD-10-CM | POA: Insufficient documentation

## 2020-07-22 NOTE — Assessment & Plan Note (Addendum)
Patient presents today for pap smear. She cannot recall when her last pap smear was. She previously declined pap smear with PCP because she prefers a female provider. However, upon meeting patient and discussing procedure, she immediately became tearful and expressed anxiety regarding the procedure. When asked why she is anxious, she becomes even more tearful and states she wants to leave. She denies prior trauma related to pap smear or otherwise. States she has not been sexually active in many years since her husband passed away. She also explains she is fearful of the result. Left exam room for patient to redress, and upon returning, she was much more calm.   Discussed with patient that although cervical cancer screening is very important, with her history of alcoholic cirrhosis, screening for esophageal varices is arguably going to be more important for her.  Plan: - discuss pap smear at future visits

## 2020-07-22 NOTE — Assessment & Plan Note (Addendum)
Patient reports that she is doing well on her cirrhosis medication regimen. Endorses adherence to spironolactone 100 mg BID, lasix 40 mg twice daily, protonix 40 mg daily, and lactulose 20 g BID as prescribed. She endorses persistent mild abdominal bloating as well as mild nausea with occasional vomiting. She states she continues to abstain from alcohol use. States her weight has been above what she considers to be her ideal weight. Today she is 142 lb whereas she feels best ~135 lb. She reports she has not followed up with Dr. Lyndel Safe (GI) recently. Per chart review, she was urged to schedule follow-up with him at her appointment in 02/2020.   On exam, she appears comfortable and there are no overt signs of hypervolemia or ascites on exam.   We had a long discussion regarding the importance of at least EGD to screen for and possibly treat esophageal varices. Of note, esophageal varices were seen on MRI in 01/2020.   Plan: - Continue current medication regimen - Follow-up with GI Lyndel Safe) for at least EGD - Continue to abstain from alcohol use - CBC, CMP, Mg, PT/INR  ADDENDUM:  - Based on the above lab work, current MELD score 11, or 6% 11-month mortality. [inputs: Cr 0.59, Tbili 1.8, INR 1.2, Na 135] - Mg low at 1.2, decreased from 1.5 at last check. Patient reported during visit she has been taking daily OTC Mg supplement. No answer when attempting to reach patient. Left VM asking for her to call back. Also left note in MyChart advising her to take magnesium 200 mg p.o. once a day OTC.

## 2020-07-22 NOTE — Assessment & Plan Note (Signed)
Patient states she is doing well on her Celexa 20 mg daily and trazodone QHS PRN. She states her anxiety and tearfulness today are specifically related to the idea of a pap smear. She declines referral to our [facility] counselor because she states her best friend is a Engineer, water and she has adequate social and emotional support.  - Celexa and trazodone refilled

## 2020-07-24 ENCOUNTER — Ambulatory Visit: Payer: Self-pay | Admitting: Dermatology

## 2020-07-24 NOTE — Progress Notes (Signed)
Internal Medicine Clinic Attending  Case discussed with Dr. Watson  At the time of the visit.  We reviewed the resident's history and exam and pertinent patient test results.  I agree with the assessment, diagnosis, and plan of care documented in the resident's note.  

## 2020-08-15 ENCOUNTER — Encounter: Payer: Self-pay | Admitting: *Deleted

## 2020-09-02 ENCOUNTER — Other Ambulatory Visit: Payer: Self-pay

## 2020-09-04 ENCOUNTER — Other Ambulatory Visit: Payer: Self-pay | Admitting: Internal Medicine

## 2020-09-04 ENCOUNTER — Other Ambulatory Visit (HOSPITAL_COMMUNITY): Payer: Self-pay

## 2020-09-04 DIAGNOSIS — K7030 Alcoholic cirrhosis of liver without ascites: Secondary | ICD-10-CM

## 2020-09-04 DIAGNOSIS — K7031 Alcoholic cirrhosis of liver with ascites: Secondary | ICD-10-CM

## 2020-09-04 DIAGNOSIS — G4701 Insomnia due to medical condition: Secondary | ICD-10-CM

## 2020-09-05 ENCOUNTER — Other Ambulatory Visit (HOSPITAL_COMMUNITY): Payer: Self-pay

## 2020-09-06 ENCOUNTER — Other Ambulatory Visit (HOSPITAL_COMMUNITY): Payer: Self-pay

## 2020-09-06 ENCOUNTER — Other Ambulatory Visit: Payer: Self-pay | Admitting: Internal Medicine

## 2020-09-06 DIAGNOSIS — K7030 Alcoholic cirrhosis of liver without ascites: Secondary | ICD-10-CM

## 2020-09-06 DIAGNOSIS — K7031 Alcoholic cirrhosis of liver with ascites: Secondary | ICD-10-CM

## 2020-09-06 DIAGNOSIS — G4701 Insomnia due to medical condition: Secondary | ICD-10-CM

## 2020-09-07 ENCOUNTER — Other Ambulatory Visit: Payer: Self-pay | Admitting: Internal Medicine

## 2020-09-07 ENCOUNTER — Other Ambulatory Visit (HOSPITAL_COMMUNITY): Payer: Self-pay

## 2020-09-07 DIAGNOSIS — K7030 Alcoholic cirrhosis of liver without ascites: Secondary | ICD-10-CM

## 2020-09-07 DIAGNOSIS — K7031 Alcoholic cirrhosis of liver with ascites: Secondary | ICD-10-CM

## 2020-09-07 DIAGNOSIS — G4701 Insomnia due to medical condition: Secondary | ICD-10-CM

## 2020-09-07 MED FILL — Trazodone HCl Tab 50 MG: ORAL | 30 days supply | Qty: 30 | Fill #0 | Status: CN

## 2020-09-07 MED FILL — Lactulose (Encephalopathy) Solution 10 GM/15ML: ORAL | 30 days supply | Qty: 1800 | Fill #0 | Status: CN

## 2020-09-07 MED FILL — Pantoprazole Sodium EC Tab 40 MG (Base Equiv): ORAL | 30 days supply | Qty: 30 | Fill #0 | Status: CN

## 2020-09-08 ENCOUNTER — Other Ambulatory Visit (HOSPITAL_COMMUNITY): Payer: Self-pay

## 2020-09-11 ENCOUNTER — Other Ambulatory Visit (HOSPITAL_COMMUNITY): Payer: Self-pay

## 2020-09-18 ENCOUNTER — Other Ambulatory Visit (HOSPITAL_COMMUNITY): Payer: Self-pay

## 2020-09-18 MED FILL — Trazodone HCl Tab 50 MG: ORAL | 30 days supply | Qty: 30 | Fill #0 | Status: AC

## 2020-09-18 MED FILL — Lactulose (Encephalopathy) Solution 10 GM/15ML: ORAL | 32 days supply | Qty: 1892 | Fill #0 | Status: AC

## 2020-09-18 MED FILL — Pantoprazole Sodium EC Tab 40 MG (Base Equiv): ORAL | 30 days supply | Qty: 30 | Fill #0 | Status: AC

## 2020-10-27 ENCOUNTER — Other Ambulatory Visit: Payer: Self-pay | Admitting: Internal Medicine

## 2020-10-27 ENCOUNTER — Other Ambulatory Visit (HOSPITAL_COMMUNITY): Payer: Self-pay

## 2020-10-27 DIAGNOSIS — G4701 Insomnia due to medical condition: Secondary | ICD-10-CM

## 2020-10-30 ENCOUNTER — Other Ambulatory Visit (HOSPITAL_COMMUNITY): Payer: Self-pay

## 2020-10-30 ENCOUNTER — Other Ambulatory Visit: Payer: Self-pay | Admitting: Internal Medicine

## 2020-10-30 DIAGNOSIS — K7031 Alcoholic cirrhosis of liver with ascites: Secondary | ICD-10-CM

## 2020-10-30 DIAGNOSIS — K7030 Alcoholic cirrhosis of liver without ascites: Secondary | ICD-10-CM

## 2020-10-31 ENCOUNTER — Other Ambulatory Visit (HOSPITAL_COMMUNITY): Payer: Self-pay

## 2020-10-31 MED ORDER — TRAZODONE HCL 50 MG PO TABS
50.0000 mg | ORAL_TABLET | Freq: Every evening | ORAL | 0 refills | Status: DC | PRN
  Filled 2020-10-31: qty 30, 30d supply, fill #0

## 2020-11-06 ENCOUNTER — Other Ambulatory Visit (HOSPITAL_COMMUNITY): Payer: Self-pay

## 2020-11-06 ENCOUNTER — Other Ambulatory Visit: Payer: Self-pay | Admitting: Internal Medicine

## 2020-11-06 DIAGNOSIS — K7031 Alcoholic cirrhosis of liver with ascites: Secondary | ICD-10-CM

## 2020-11-06 DIAGNOSIS — K7030 Alcoholic cirrhosis of liver without ascites: Secondary | ICD-10-CM

## 2020-11-07 ENCOUNTER — Other Ambulatory Visit (HOSPITAL_COMMUNITY): Payer: Self-pay

## 2020-11-08 ENCOUNTER — Other Ambulatory Visit (HOSPITAL_COMMUNITY): Payer: Self-pay

## 2020-11-08 ENCOUNTER — Other Ambulatory Visit: Payer: Self-pay | Admitting: Internal Medicine

## 2020-11-08 DIAGNOSIS — K7031 Alcoholic cirrhosis of liver with ascites: Secondary | ICD-10-CM

## 2020-11-08 DIAGNOSIS — K7030 Alcoholic cirrhosis of liver without ascites: Secondary | ICD-10-CM

## 2020-11-09 ENCOUNTER — Other Ambulatory Visit (HOSPITAL_COMMUNITY): Payer: Self-pay

## 2020-11-09 MED ORDER — LACTULOSE ENCEPHALOPATHY 10 GM/15ML PO SOLN
20.0000 g | Freq: Two times a day (BID) | ORAL | 0 refills | Status: DC
  Filled 2020-11-09: qty 1892, 32d supply, fill #0

## 2020-11-09 MED ORDER — PANTOPRAZOLE SODIUM 40 MG PO TBEC
40.0000 mg | DELAYED_RELEASE_TABLET | Freq: Every day | ORAL | 0 refills | Status: DC
  Filled 2020-11-09: qty 30, 30d supply, fill #0

## 2020-11-13 ENCOUNTER — Other Ambulatory Visit (HOSPITAL_COMMUNITY): Payer: Self-pay

## 2020-11-14 ENCOUNTER — Other Ambulatory Visit (HOSPITAL_COMMUNITY): Payer: Self-pay

## 2020-11-29 ENCOUNTER — Other Ambulatory Visit: Payer: Self-pay | Admitting: Internal Medicine

## 2020-11-29 ENCOUNTER — Other Ambulatory Visit (HOSPITAL_COMMUNITY): Payer: Self-pay

## 2020-11-29 DIAGNOSIS — K7031 Alcoholic cirrhosis of liver with ascites: Secondary | ICD-10-CM

## 2020-11-29 DIAGNOSIS — G4701 Insomnia due to medical condition: Secondary | ICD-10-CM

## 2020-11-29 DIAGNOSIS — K7030 Alcoholic cirrhosis of liver without ascites: Secondary | ICD-10-CM

## 2020-11-29 NOTE — Telephone Encounter (Signed)
Received a TC from patient who states she [place] the beach [place]. She is requesting new RX's for Trazodone, Lactulose, and Pantoprazole.  Will send request to PCP.  SChaplin, RN,BSN

## 2020-11-30 ENCOUNTER — Other Ambulatory Visit (HOSPITAL_COMMUNITY): Payer: Self-pay

## 2020-12-01 ENCOUNTER — Other Ambulatory Visit (HOSPITAL_COMMUNITY): Payer: Self-pay

## 2020-12-04 ENCOUNTER — Other Ambulatory Visit (HOSPITAL_COMMUNITY): Payer: Self-pay

## 2020-12-04 ENCOUNTER — Other Ambulatory Visit: Payer: Self-pay | Admitting: Internal Medicine

## 2020-12-04 DIAGNOSIS — K7031 Alcoholic cirrhosis of liver with ascites: Secondary | ICD-10-CM

## 2020-12-04 DIAGNOSIS — K7030 Alcoholic cirrhosis of liver without ascites: Secondary | ICD-10-CM

## 2020-12-05 ENCOUNTER — Other Ambulatory Visit (HOSPITAL_COMMUNITY): Payer: Self-pay

## 2020-12-05 MED ORDER — TRAZODONE HCL 50 MG PO TABS
50.0000 mg | ORAL_TABLET | Freq: Every evening | ORAL | 0 refills | Status: DC | PRN
  Filled 2020-12-05 – 2021-01-08 (×2): qty 30, 30d supply, fill #0

## 2020-12-05 MED ORDER — LACTULOSE ENCEPHALOPATHY 10 GM/15ML PO SOLN
20.0000 g | Freq: Two times a day (BID) | ORAL | 0 refills | Status: DC
  Filled 2020-12-05 – 2021-01-08 (×2): qty 1892, 32d supply, fill #0

## 2020-12-05 MED ORDER — PANTOPRAZOLE SODIUM 40 MG PO TBEC
40.0000 mg | DELAYED_RELEASE_TABLET | Freq: Every day | ORAL | 0 refills | Status: DC
  Filled 2020-12-05: qty 30, 30d supply, fill #0

## 2020-12-05 MED ORDER — PANTOPRAZOLE SODIUM 40 MG PO TBEC
40.0000 mg | DELAYED_RELEASE_TABLET | Freq: Every day | ORAL | 0 refills | Status: DC
  Filled 2020-12-05 – 2021-01-08 (×2): qty 30, 30d supply, fill #0

## 2020-12-13 ENCOUNTER — Other Ambulatory Visit (HOSPITAL_COMMUNITY): Payer: Self-pay

## 2021-01-08 ENCOUNTER — Other Ambulatory Visit: Payer: Self-pay | Admitting: Student

## 2021-01-08 ENCOUNTER — Other Ambulatory Visit (HOSPITAL_COMMUNITY): Payer: Self-pay

## 2021-01-08 DIAGNOSIS — K7030 Alcoholic cirrhosis of liver without ascites: Secondary | ICD-10-CM

## 2021-01-08 DIAGNOSIS — K7031 Alcoholic cirrhosis of liver with ascites: Secondary | ICD-10-CM

## 2021-01-09 ENCOUNTER — Other Ambulatory Visit (HOSPITAL_COMMUNITY): Payer: Self-pay

## 2021-01-09 MED ORDER — SPIRONOLACTONE 100 MG PO TABS
100.0000 mg | ORAL_TABLET | Freq: Two times a day (BID) | ORAL | 3 refills | Status: DC
  Filled 2021-01-09: qty 60, 30d supply, fill #0
  Filled 2021-03-09: qty 60, 30d supply, fill #1
  Filled 2021-04-19: qty 60, 30d supply, fill #2
  Filled 2021-06-07: qty 60, 30d supply, fill #3

## 2021-01-09 MED ORDER — FUROSEMIDE 40 MG PO TABS
40.0000 mg | ORAL_TABLET | Freq: Two times a day (BID) | ORAL | 3 refills | Status: DC
  Filled 2021-01-09: qty 60, 30d supply, fill #0
  Filled 2021-03-09: qty 60, 30d supply, fill #1
  Filled 2021-04-19: qty 60, 30d supply, fill #2
  Filled 2021-06-07: qty 60, 30d supply, fill #3

## 2021-01-15 ENCOUNTER — Other Ambulatory Visit (HOSPITAL_COMMUNITY): Payer: Self-pay

## 2021-03-09 ENCOUNTER — Other Ambulatory Visit: Payer: Self-pay | Admitting: Internal Medicine

## 2021-03-09 ENCOUNTER — Other Ambulatory Visit (HOSPITAL_COMMUNITY): Payer: Self-pay

## 2021-03-09 ENCOUNTER — Other Ambulatory Visit: Payer: Self-pay | Admitting: Student

## 2021-03-09 ENCOUNTER — Other Ambulatory Visit: Payer: Self-pay

## 2021-03-09 DIAGNOSIS — K7031 Alcoholic cirrhosis of liver with ascites: Secondary | ICD-10-CM

## 2021-03-09 DIAGNOSIS — K7030 Alcoholic cirrhosis of liver without ascites: Secondary | ICD-10-CM

## 2021-03-09 DIAGNOSIS — M19041 Primary osteoarthritis, right hand: Secondary | ICD-10-CM

## 2021-03-09 DIAGNOSIS — G4701 Insomnia due to medical condition: Secondary | ICD-10-CM

## 2021-03-09 MED ORDER — DICLOFENAC SODIUM 1 % EX GEL
2.0000 g | Freq: Four times a day (QID) | CUTANEOUS | 4 refills | Status: DC | PRN
  Filled 2021-03-09: qty 200, 25d supply, fill #0
  Filled 2021-04-19: qty 200, 25d supply, fill #1
  Filled 2021-06-07: qty 200, 25d supply, fill #2
  Filled 2021-07-05: qty 200, 25d supply, fill #3
  Filled 2021-08-03: qty 200, 25d supply, fill #4

## 2021-03-09 MED ORDER — LACTULOSE ENCEPHALOPATHY 10 GM/15ML PO SOLN
20.0000 g | Freq: Two times a day (BID) | ORAL | 0 refills | Status: DC
  Filled 2021-03-09: qty 1892, 30d supply, fill #0

## 2021-03-09 MED ORDER — PANTOPRAZOLE SODIUM 40 MG PO TBEC
40.0000 mg | DELAYED_RELEASE_TABLET | Freq: Every day | ORAL | 0 refills | Status: DC
Start: 2021-03-09 — End: 2021-04-19
  Filled 2021-03-09: qty 30, 30d supply, fill #0

## 2021-03-09 MED ORDER — TRAZODONE HCL 50 MG PO TABS
50.0000 mg | ORAL_TABLET | Freq: Every evening | ORAL | 0 refills | Status: DC | PRN
  Filled 2021-03-09: qty 30, 30d supply, fill #0

## 2021-03-12 ENCOUNTER — Other Ambulatory Visit (HOSPITAL_COMMUNITY): Payer: Self-pay

## 2021-04-19 ENCOUNTER — Other Ambulatory Visit (HOSPITAL_COMMUNITY): Payer: Self-pay

## 2021-04-19 ENCOUNTER — Other Ambulatory Visit: Payer: Self-pay | Admitting: Internal Medicine

## 2021-04-19 DIAGNOSIS — K7030 Alcoholic cirrhosis of liver without ascites: Secondary | ICD-10-CM

## 2021-04-19 DIAGNOSIS — G4701 Insomnia due to medical condition: Secondary | ICD-10-CM

## 2021-04-19 DIAGNOSIS — K7031 Alcoholic cirrhosis of liver with ascites: Secondary | ICD-10-CM

## 2021-04-19 MED ORDER — TRAZODONE HCL 50 MG PO TABS
50.0000 mg | ORAL_TABLET | Freq: Every evening | ORAL | 0 refills | Status: DC | PRN
  Filled 2021-04-19: qty 30, 30d supply, fill #0

## 2021-04-19 MED ORDER — LACTULOSE ENCEPHALOPATHY 10 GM/15ML PO SOLN
20.0000 g | Freq: Two times a day (BID) | ORAL | 0 refills | Status: DC
Start: 2021-04-19 — End: 2021-06-07
  Filled 2021-04-19: qty 1892, 30d supply, fill #0

## 2021-04-19 MED ORDER — PANTOPRAZOLE SODIUM 40 MG PO TBEC
40.0000 mg | DELAYED_RELEASE_TABLET | Freq: Every day | ORAL | 0 refills | Status: DC
  Filled 2021-04-19: qty 30, 30d supply, fill #0

## 2021-06-07 ENCOUNTER — Other Ambulatory Visit (HOSPITAL_COMMUNITY): Payer: Self-pay

## 2021-06-07 ENCOUNTER — Other Ambulatory Visit: Payer: Self-pay | Admitting: Internal Medicine

## 2021-06-07 DIAGNOSIS — G4701 Insomnia due to medical condition: Secondary | ICD-10-CM

## 2021-06-07 DIAGNOSIS — K7030 Alcoholic cirrhosis of liver without ascites: Secondary | ICD-10-CM

## 2021-06-07 DIAGNOSIS — K7031 Alcoholic cirrhosis of liver with ascites: Secondary | ICD-10-CM

## 2021-06-07 MED ORDER — PANTOPRAZOLE SODIUM 40 MG PO TBEC
40.0000 mg | DELAYED_RELEASE_TABLET | Freq: Every day | ORAL | 0 refills | Status: DC
  Filled 2021-06-07: qty 30, 30d supply, fill #0

## 2021-06-07 MED ORDER — TRAZODONE HCL 50 MG PO TABS
50.0000 mg | ORAL_TABLET | Freq: Every evening | ORAL | 0 refills | Status: DC | PRN
  Filled 2021-06-07: qty 30, 30d supply, fill #0

## 2021-06-07 MED ORDER — LACTULOSE ENCEPHALOPATHY 10 GM/15ML PO SOLN
20.0000 g | Freq: Two times a day (BID) | ORAL | 0 refills | Status: DC
  Filled 2021-06-07: qty 1892, 32d supply, fill #0

## 2021-07-05 ENCOUNTER — Other Ambulatory Visit: Payer: Self-pay | Admitting: Internal Medicine

## 2021-07-05 DIAGNOSIS — K7031 Alcoholic cirrhosis of liver with ascites: Secondary | ICD-10-CM

## 2021-07-05 DIAGNOSIS — G4701 Insomnia due to medical condition: Secondary | ICD-10-CM

## 2021-07-05 DIAGNOSIS — K7030 Alcoholic cirrhosis of liver without ascites: Secondary | ICD-10-CM

## 2021-07-06 ENCOUNTER — Other Ambulatory Visit (HOSPITAL_COMMUNITY): Payer: Self-pay

## 2021-07-06 MED ORDER — LACTULOSE 10 GM/15ML PO SOLN
20.0000 g | Freq: Two times a day (BID) | ORAL | 0 refills | Status: DC
  Filled 2021-07-06: qty 1892, 32d supply, fill #0

## 2021-07-06 MED ORDER — SPIRONOLACTONE 100 MG PO TABS
100.0000 mg | ORAL_TABLET | Freq: Two times a day (BID) | ORAL | 3 refills | Status: DC
  Filled 2021-07-06: qty 60, 30d supply, fill #0
  Filled 2021-08-03: qty 60, 30d supply, fill #1
  Filled 2021-08-28: qty 60, 30d supply, fill #2

## 2021-07-06 MED ORDER — PANTOPRAZOLE SODIUM 40 MG PO TBEC
40.0000 mg | DELAYED_RELEASE_TABLET | Freq: Every day | ORAL | 0 refills | Status: DC
  Filled 2021-07-06: qty 30, 30d supply, fill #0

## 2021-07-06 MED ORDER — FUROSEMIDE 40 MG PO TABS
40.0000 mg | ORAL_TABLET | Freq: Two times a day (BID) | ORAL | 3 refills | Status: DC
  Filled 2021-07-06: qty 60, 30d supply, fill #0
  Filled 2021-08-03: qty 60, 30d supply, fill #1
  Filled 2021-08-28: qty 60, 30d supply, fill #2

## 2021-07-06 MED ORDER — TRAZODONE HCL 50 MG PO TABS
50.0000 mg | ORAL_TABLET | Freq: Every evening | ORAL | 0 refills | Status: DC | PRN
  Filled 2021-07-06: qty 30, 30d supply, fill #0

## 2021-08-03 ENCOUNTER — Other Ambulatory Visit: Payer: Self-pay

## 2021-08-03 ENCOUNTER — Other Ambulatory Visit (HOSPITAL_COMMUNITY): Payer: Self-pay

## 2021-08-03 ENCOUNTER — Other Ambulatory Visit: Payer: Self-pay | Admitting: Internal Medicine

## 2021-08-03 DIAGNOSIS — K7030 Alcoholic cirrhosis of liver without ascites: Secondary | ICD-10-CM

## 2021-08-03 DIAGNOSIS — G4701 Insomnia due to medical condition: Secondary | ICD-10-CM

## 2021-08-03 DIAGNOSIS — K7031 Alcoholic cirrhosis of liver with ascites: Secondary | ICD-10-CM

## 2021-08-03 DIAGNOSIS — F32A Depression, unspecified: Secondary | ICD-10-CM

## 2021-08-04 ENCOUNTER — Encounter: Payer: Self-pay | Admitting: *Deleted

## 2021-08-06 ENCOUNTER — Other Ambulatory Visit (HOSPITAL_COMMUNITY): Payer: Self-pay

## 2021-08-06 MED ORDER — PANTOPRAZOLE SODIUM 40 MG PO TBEC
40.0000 mg | DELAYED_RELEASE_TABLET | Freq: Every day | ORAL | 0 refills | Status: DC
  Filled 2021-08-06: qty 30, 30d supply, fill #0

## 2021-08-06 MED ORDER — CITALOPRAM HYDROBROMIDE 20 MG PO TABS
20.0000 mg | ORAL_TABLET | Freq: Every day | ORAL | 11 refills | Status: DC
  Filled 2021-08-06: qty 30, 30d supply, fill #0
  Filled 2021-08-28: qty 30, 30d supply, fill #1

## 2021-08-06 MED ORDER — LACTULOSE 10 GM/15ML PO SOLN
20.0000 g | Freq: Two times a day (BID) | ORAL | 0 refills | Status: DC
  Filled 2021-08-06: qty 1892, 32d supply, fill #0

## 2021-08-06 MED ORDER — TRAZODONE HCL 50 MG PO TABS
50.0000 mg | ORAL_TABLET | Freq: Every evening | ORAL | 0 refills | Status: DC | PRN
  Filled 2021-08-06: qty 30, 30d supply, fill #0

## 2021-08-09 ENCOUNTER — Other Ambulatory Visit (HOSPITAL_COMMUNITY): Payer: Self-pay

## 2021-08-28 ENCOUNTER — Other Ambulatory Visit: Payer: Self-pay | Admitting: Internal Medicine

## 2021-08-28 ENCOUNTER — Other Ambulatory Visit: Payer: Self-pay

## 2021-08-28 DIAGNOSIS — K7031 Alcoholic cirrhosis of liver with ascites: Secondary | ICD-10-CM

## 2021-08-28 DIAGNOSIS — K7030 Alcoholic cirrhosis of liver without ascites: Secondary | ICD-10-CM

## 2021-08-28 DIAGNOSIS — G4701 Insomnia due to medical condition: Secondary | ICD-10-CM

## 2021-08-29 ENCOUNTER — Other Ambulatory Visit (HOSPITAL_COMMUNITY): Payer: Self-pay

## 2021-08-30 ENCOUNTER — Encounter: Payer: Self-pay | Admitting: *Deleted

## 2021-08-30 ENCOUNTER — Other Ambulatory Visit: Payer: Self-pay | Admitting: Internal Medicine

## 2021-08-30 ENCOUNTER — Other Ambulatory Visit (HOSPITAL_COMMUNITY): Payer: Self-pay

## 2021-08-30 DIAGNOSIS — M19041 Primary osteoarthritis, right hand: Secondary | ICD-10-CM

## 2021-08-30 MED ORDER — DICLOFENAC SODIUM 1 % EX GEL
2.0000 g | Freq: Four times a day (QID) | CUTANEOUS | 4 refills | Status: AC | PRN
  Filled 2021-08-30: qty 200, 25d supply, fill #0
  Filled 2021-12-07: qty 200, 25d supply, fill #1
  Filled 2022-07-18: qty 200, 25d supply, fill #2
  Filled 2022-08-24: qty 200, 20d supply, fill #3

## 2021-08-30 MED ORDER — PANTOPRAZOLE SODIUM 40 MG PO TBEC
40.0000 mg | DELAYED_RELEASE_TABLET | Freq: Every day | ORAL | 0 refills | Status: DC
  Filled 2021-08-30: qty 30, 30d supply, fill #0

## 2021-08-30 MED ORDER — TRAZODONE HCL 50 MG PO TABS
50.0000 mg | ORAL_TABLET | Freq: Every evening | ORAL | 0 refills | Status: DC | PRN
  Filled 2021-08-30: qty 30, 30d supply, fill #0

## 2021-08-30 MED ORDER — LACTULOSE 10 GM/15ML PO SOLN
20.0000 g | Freq: Two times a day (BID) | ORAL | 0 refills | Status: DC
  Filled 2021-08-30: qty 1892, 32d supply, fill #0

## 2021-08-30 NOTE — Telephone Encounter (Signed)
Last OV 07/20/20. Called to schedule an appt - no answer; left message to call the office to schedule an appt. Also message sent via My Chart.

## 2021-12-03 ENCOUNTER — Ambulatory Visit (INDEPENDENT_AMBULATORY_CARE_PROVIDER_SITE_OTHER): Payer: Self-pay

## 2021-12-03 ENCOUNTER — Other Ambulatory Visit (HOSPITAL_COMMUNITY): Payer: Self-pay

## 2021-12-03 DIAGNOSIS — G4701 Insomnia due to medical condition: Secondary | ICD-10-CM

## 2021-12-03 DIAGNOSIS — F32A Depression, unspecified: Secondary | ICD-10-CM

## 2021-12-03 DIAGNOSIS — F1090 Alcohol use, unspecified, uncomplicated: Secondary | ICD-10-CM

## 2021-12-03 DIAGNOSIS — F17210 Nicotine dependence, cigarettes, uncomplicated: Secondary | ICD-10-CM

## 2021-12-03 DIAGNOSIS — K7031 Alcoholic cirrhosis of liver with ascites: Secondary | ICD-10-CM

## 2021-12-03 DIAGNOSIS — Z0000 Encounter for general adult medical examination without abnormal findings: Secondary | ICD-10-CM | POA: Insufficient documentation

## 2021-12-03 DIAGNOSIS — K7030 Alcoholic cirrhosis of liver without ascites: Secondary | ICD-10-CM

## 2021-12-03 DIAGNOSIS — G2581 Restless legs syndrome: Secondary | ICD-10-CM

## 2021-12-03 DIAGNOSIS — I10 Essential (primary) hypertension: Secondary | ICD-10-CM

## 2021-12-03 DIAGNOSIS — R030 Elevated blood-pressure reading, without diagnosis of hypertension: Secondary | ICD-10-CM | POA: Insufficient documentation

## 2021-12-03 LAB — CBC
HCT: 41.3 % (ref 36.0–46.0)
Hemoglobin: 15.3 g/dL — ABNORMAL HIGH (ref 12.0–15.0)
MCH: 37.6 pg — ABNORMAL HIGH (ref 26.0–34.0)
MCHC: 37 g/dL — ABNORMAL HIGH (ref 30.0–36.0)
MCV: 101.5 fL — ABNORMAL HIGH (ref 80.0–100.0)
Platelets: 54 10*3/uL — ABNORMAL LOW (ref 150–400)
RBC: 4.07 MIL/uL (ref 3.87–5.11)
RDW: 12.3 % (ref 11.5–15.5)
WBC: 4.1 10*3/uL (ref 4.0–10.5)
nRBC: 0 % (ref 0.0–0.2)

## 2021-12-03 LAB — COMPREHENSIVE METABOLIC PANEL
ALT: 41 U/L (ref 0–44)
AST: 118 U/L — ABNORMAL HIGH (ref 15–41)
Albumin: 3.7 g/dL (ref 3.5–5.0)
Alkaline Phosphatase: 133 U/L — ABNORMAL HIGH (ref 38–126)
Anion gap: 13 (ref 5–15)
BUN: <5 mg/dL — ABNORMAL LOW (ref 6–20)
CO2: 25 mmol/L (ref 22–32)
Calcium: 9 mg/dL (ref 8.9–10.3)
Chloride: 98 mmol/L (ref 98–111)
Creatinine, Ser: 0.46 mg/dL (ref 0.44–1.00)
GFR, Estimated: >60 mL/min (ref >60)
Glucose, Bld: 106 mg/dL — ABNORMAL HIGH (ref 70–99)
Potassium: 3.2 mmol/L — ABNORMAL LOW (ref 3.5–5.1)
Sodium: 136 mmol/L (ref 135–145)
Total Bilirubin: 2.3 mg/dL — ABNORMAL HIGH (ref 0.3–1.2)
Total Protein: 7.2 g/dL (ref 6.5–8.1)

## 2021-12-03 MED ORDER — TRAZODONE HCL 50 MG PO TABS
50.0000 mg | ORAL_TABLET | Freq: Every evening | ORAL | 0 refills | Status: DC | PRN
  Filled 2021-12-03: qty 30, 30d supply, fill #0

## 2021-12-03 MED ORDER — FUROSEMIDE 40 MG PO TABS
40.0000 mg | ORAL_TABLET | Freq: Two times a day (BID) | ORAL | 3 refills | Status: AC
  Filled 2021-12-03: qty 60, 30d supply, fill #0
  Filled 2022-07-18: qty 60, 30d supply, fill #1
  Filled 2022-08-24: qty 60, 30d supply, fill #2
  Filled 2022-10-01 – 2022-10-21 (×2): qty 60, 30d supply, fill #3

## 2021-12-03 MED ORDER — CITALOPRAM HYDROBROMIDE 20 MG PO TABS
20.0000 mg | ORAL_TABLET | Freq: Every day | ORAL | 11 refills | Status: DC
  Filled 2021-12-03: qty 30, 30d supply, fill #0

## 2021-12-03 MED ORDER — SPIRONOLACTONE 100 MG PO TABS
100.0000 mg | ORAL_TABLET | Freq: Two times a day (BID) | ORAL | 3 refills | Status: AC
  Filled 2021-12-03: qty 60, 30d supply, fill #0
  Filled 2022-07-18: qty 60, 30d supply, fill #1
  Filled 2022-08-24: qty 60, 30d supply, fill #2
  Filled 2022-10-01 – 2022-10-21 (×2): qty 60, 30d supply, fill #3

## 2021-12-03 NOTE — Assessment & Plan Note (Signed)
BP was 123/100 and 153/96 on repeat. She has not taken her lasix or spironolactone in a few months. Counseled her on measuring daily BP once back on medications to determine the need for additional antihypertensive agents at next visit.

## 2021-12-03 NOTE — Progress Notes (Deleted)
Cbc Alcohol Cirrhosis Lasix 40 Spiro 100 -hep B titers -variceal screening -HCC screen -cmp -lipid panel  Depression Citalopram Trazadone  HCM: Pap Mammo Colonoscopy flu

## 2021-12-03 NOTE — Progress Notes (Signed)
Established Patient Office Visit  Subjective   Patient ID: Kayla Sandoval, female    DOB: Oct 28, 1970  Age: 51 y.o. MRN: [id number]  Chief Complaint  Patient presents with   Medication Refill    Ms. Greenleaf is a 51 y/o femal with a pmh outlined below. Please see encounter tab for HPI and A/P information.  Medication Refill      Review of Systems  All other systems reviewed and are negative.     Objective:     BP (!) 153/96 (BP Location: Left Arm, Patient Position: Sitting, Cuff Size: Small)   Pulse 89   Wt 126 lb 14.4 oz (57.6 kg)   SpO2 95%   BMI 22.48 kg/m    Physical Exam Constitutional:      Appearance: She is normal weight.     Comments: Chronically ill appearing  HENT:     Head: Normocephalic and atraumatic.  Eyes:     General: No scleral icterus.    Extraocular Movements: Extraocular movements intact.     Conjunctiva/sclera: Conjunctivae normal.     Pupils: Pupils are equal, round, and reactive to light.  Cardiovascular:     Rate and Rhythm: Normal rate and regular rhythm.     Pulses: Normal pulses.     Heart sounds: Normal heart sounds. No murmur heard.    No gallop.  Pulmonary:     Effort: Pulmonary effort is normal. No respiratory distress.     Breath sounds: Normal breath sounds. No wheezing or rales.  Abdominal:     General: Abdomen is flat. Bowel sounds are normal. There is no distension.     Palpations: Abdomen is soft. There is no mass.     Tenderness: There is no abdominal tenderness. There is no guarding or rebound.  Musculoskeletal:     Right lower leg: No edema.     Left lower leg: No edema.  Skin:    General: Skin is warm and dry.     Capillary Refill: Capillary refill takes less than 2 seconds.     Coloration: Skin is not jaundiced.  Neurological:     Mental Status: She is alert.      Results for orders placed or performed in visit on 12/03/21  CBC no Diff  Result Value Ref Range   WBC 4.1 4.0 - 10.5 K/uL   RBC 4.07 3.87 -  5.11 MIL/uL   Hemoglobin 15.3 (H) 12.0 - 15.0 g/dL   HCT 41.3 36.0 - 46.0 %   MCV 101.5 (H) 80.0 - 100.0 fL   MCH 37.6 (H) 26.0 - 34.0 pg   MCHC 37.0 (H) 30.0 - 36.0 g/dL   RDW 12.3 11.5 - 15.5 %   Platelets 54 (L) 150 - 400 K/uL   nRBC 0.0 0.0 - 0.2 %  CMP w Anion Gap (STAT/Sunquest-performed on-site)  Result Value Ref Range   Sodium 136 135 - 145 mmol/L   Potassium 3.2 (L) 3.5 - 5.1 mmol/L   Chloride 98 98 - 111 mmol/L   CO2 25 22 - 32 mmol/L   Glucose, Bld 106 (H) 70 - 99 mg/dL   BUN <5 (L) 6 - 20 mg/dL   Creatinine, Ser 0.46 0.44 - 1.00 mg/dL   Calcium 9.0 8.9 - 10.3 mg/dL   Total Protein 7.2 6.5 - 8.1 g/dL   Albumin 3.7 3.5 - 5.0 g/dL   AST 118 (H) 15 - 41 U/L   ALT 41 0 - 44 U/L   Alkaline Phosphatase  133 (H) 38 - 126 U/L   Total Bilirubin 2.3 (H) 0.3 - 1.2 mg/dL   GFR, Estimated >60 >60 mL/min   Anion gap 13 5 - 15      The ASCVD Risk score (Arnett DK, et al., 2019) failed to calculate for the following reasons:   Cannot find a previous HDL lab   Cannot find a previous total cholesterol lab    Assessment & Plan:   Problem List Items Addressed This Visit       Cardiovascular and Mediastinum   Hypertension    BP was 123/100 and 153/96 on repeat. She has not taken her lasix or spironolactone in a few months. Counseled her on measuring daily BP once back on medications to determine the need for additional antihypertensive agents at next visit.      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 Lyndel Safe who said if the work up at that time was ok she could see [place]. The patient interpreted that as she no longer needed follow up. Patient is currently drinking a 24 ounce tall boy a few times per week since breaking her collar bone in the summer. She does not with to go to AA and does not want any  pharmacologic help with cessation at this time. Exam is without scleral icterus, juandice, abdominal distension indicative of ascites. She does have diffuse ecchymoses of the bilateral UE which may reflect thrombocytopenia from her liver disease. She needs variceal and [place] screening but does not have insurance. Will refer her to social work for help with medicaid application in [place] or help with other healthcare financial resources. Her HBV surface antibody titer is subtherapeutic and she will require vaccination. Patient not currently taking lactulose as it makes her feel bad. -Lasix 40 -Spiro 100 -gastroenterology referral -social work referral -CMP,CBC -Will need HBV vaccine -will need HCC and variceal screening      Relevant Medications   furosemide (LASIX) 40 MG tablet   spironolactone (ALDACTONE) 100 MG tablet     Other   Depression   Relevant Medications   citalopram (CELEXA) 20 MG tablet   traZODone (DESYREL) 50 MG tablet   Insomnia   Relevant Medications   traZODone (DESYREL) 50 MG tablet   Restless leg syndrome    Patient reports night time leg cramping and the sensation of needing to move her legs. She says both are improved by standing up and walking. The patient deferred iron studies and vitamin D testing due to finances. She should have these done in the future to complete the workup. She also has a history of electrolyte abnormalities such as low magnesium which should be followed up if symptoms persist. Branch chain amino acids and taurine could be considered for cramping as well.      Health care maintenance    Given [facility] number to schedule mammo and pap smear and also get lipid panel.      RESOLVED: Elevated blood pressure reading   Other Visit Diagnoses     Alcoholic cirrhosis of liver with ascites (HCC)       Relevant Medications   furosemide (LASIX) 40 MG tablet   spironolactone (ALDACTONE) 100 MG tablet   Other Relevant Orders   CBC no Diff  (Completed)   CMP w Anion Gap (STAT/Sunquest-performed on-site) (  Completed)   Ambulatory referral to Gastroenterology   Ambulatory referral to Social Work       Return in about 4 weeks (around 12/31/2021).    Iona Coach, MD

## 2021-12-03 NOTE — Assessment & Plan Note (Signed)
Patient reports night time leg cramping and the sensation of needing to move her legs. She says both are improved by standing up and walking. The patient deferred iron studies and vitamin D testing due to finances. She should have these done in the future to complete the workup. She also has a history of electrolyte abnormalities such as low magnesium which should be followed up if symptoms persist. Branch chain amino acids and taurine could be considered for cramping as well.

## 2021-12-03 NOTE — Assessment & Plan Note (Addendum)
Patient last seen in November 2021 by Dr. Lyndel Safe who said if the work up at that time was ok she could see Kayla Sandoval. The patient interpreted that as she no longer needed follow up. Patient is currently drinking a 24 ounce tall boy a few times per week since breaking her collar bone in the summer. She does not with to go to AA and does not want any pharmacologic help with cessation at this time. Exam is without scleral icterus, juandice, abdominal distension indicative of ascites. She does have diffuse ecchymoses of the bilateral UE which may reflect thrombocytopenia from her liver disease. She needs variceal and [place] screening but does not have insurance. Will refer her to social work for help with medicaid application in [place] or help with other healthcare financial resources. Her HBV surface antibody titer is subtherapeutic and she will require vaccination. Patient not currently taking lactulose as it makes her feel bad. -Lasix 40 -Spiro 100 -gastroenterology referral -social work referral -CMP,CBC -Will need HBV vaccine -will need [place] and variceal screening

## 2021-12-03 NOTE — Assessment & Plan Note (Signed)
Given [facility] number to schedule mammo and pap smear and also get lipid panel.

## 2021-12-03 NOTE — Patient Instructions (Signed)
Thank you, [name] for allowing [place] to provide your care today. Today we discussed :  Cirrhosis- I have referred you to see the gastroenterologist and also refilled your medications. We will check your blood counts and liver function today. I have also referred you to a social worker to help with medicaid or other assistance with affording health care. Stopping alcohol use will be the most beneficial thing for your liver health. Please let us know if there is anything we can do to help with tha.  High blood pressure- Your blood pressure was 153/96 today. Please check your blood pressure daily at home once restarting your medications. If it remains high, we may need to start a medication.  [facility]- Please call the number in the pamphlet to schedule you mammogram, pap smear, and lipid panel.  I have ordered the following labs for you:   Lab Orders         CBC no Diff         CMP w Anion Gap (STAT/Sunquest-performed on-site)      Tests ordered today:  CBC CMP  Referrals ordered today:    Referral Orders         Ambulatory referral to Gastroenterology         Ambulatory referral to Social Work      I have ordered the following medication/changed the following medications:   Stop the following medications: Medications Discontinued During This Encounter  Medication Reason   furosemide (LASIX) 40 MG tablet Reorder   spironolactone (ALDACTONE) 100 MG tablet Reorder   citalopram (CELEXA) 20 MG tablet Reorder   traZODone (DESYREL) 50 MG tablet Reorder     Start the following medications: Meds ordered this encounter  Medications   citalopram (CELEXA) 20 MG tablet    Sig: Take 1 tablet (20 mg total) by mouth daily.    Dispense:  30 tablet    Refill:  11    IM   furosemide (LASIX) 40 MG tablet    Sig: Take 1 tablet (40 mg total) by mouth 2 (two) times daily.    Dispense:  60 tablet    Refill:  3    IM   spironolactone (ALDACTONE) 100 MG tablet    Sig: Take 1 tablet  (100 mg total) by mouth 2 (two) times daily.    Dispense:  60 tablet    Refill:  3    IM   traZODone (DESYREL) 50 MG tablet    Sig: Take 1 tablet (50 mg total) by mouth at bedtime as needed for sleep.    Dispense:  30 tablet    Refill:  0    IM program     Follow up:  4 weeks     We look forward to seeing you next time. Please call our clinic at [phone] if you have any questions or concerns. The best time to call is Monday-Friday from 9am-4pm, but there is someone available 24/7. If after hours or the weekend, call the main hospital number and ask for the Internal Medicine Resident On-Call. If you need medication refills, please notify your pharmacy one week in advance and they will send [place] a request.   Thank you for trusting me with your care. Wishing you the best!   Iona Coach, MD [place]

## 2021-12-04 ENCOUNTER — Telehealth: Payer: Self-pay

## 2021-12-04 NOTE — Telephone Encounter (Signed)
RTC to patient message left that the Clinics had returned her call.

## 2021-12-04 NOTE — Progress Notes (Signed)
The patient has macrocytosis, likely secondary to liver disease and alcohol use. Would benefit from B12 and folate labs in the future. Her upper extremity ecchymoses are in the setting of thrombocytopenia to 54, also likely secondary to liver disease. She does not have any joint or mucosal bleeding so this should be monitored for now. Potassium is low in the setting of not taking medications. This should improve with resuming spironolactone. Will notify patient to resume spironolactone and to come back for a 1 week lab follow up to check BMP.AST has increased with drinking alcohol after 2 years of abstinence.

## 2021-12-04 NOTE — Telephone Encounter (Signed)
Requesting to speak with a nurse about getting nicotine patch and medications.  Please call pt back.

## 2021-12-07 ENCOUNTER — Other Ambulatory Visit: Payer: Self-pay

## 2021-12-07 ENCOUNTER — Other Ambulatory Visit (HOSPITAL_COMMUNITY): Payer: Self-pay

## 2021-12-07 DIAGNOSIS — K7031 Alcoholic cirrhosis of liver with ascites: Secondary | ICD-10-CM

## 2021-12-07 DIAGNOSIS — K7030 Alcoholic cirrhosis of liver without ascites: Secondary | ICD-10-CM

## 2021-12-07 MED ORDER — PANTOPRAZOLE SODIUM 40 MG PO TBEC
40.0000 mg | DELAYED_RELEASE_TABLET | Freq: Every day | ORAL | 3 refills | Status: AC
  Filled 2021-12-07: qty 30, 30d supply, fill #0
  Filled 2022-07-18: qty 30, 30d supply, fill #1
  Filled 2022-08-24: qty 30, 30d supply, fill #2
  Filled 2022-10-01 – 2022-10-21 (×2): qty 30, 30d supply, fill #3

## 2021-12-10 ENCOUNTER — Other Ambulatory Visit (HOSPITAL_COMMUNITY): Payer: Self-pay

## 2021-12-10 NOTE — Progress Notes (Signed)
Internal Medicine Clinic Attending  Case discussed with Dr. Rogers  at the time of the visit.  We reviewed the resident's history and exam and pertinent patient test results.  I agree with the assessment, diagnosis, and plan of care documented in the resident's note.  

## 2021-12-13 ENCOUNTER — Ambulatory Visit (INDEPENDENT_AMBULATORY_CARE_PROVIDER_SITE_OTHER): Payer: Self-pay

## 2021-12-13 ENCOUNTER — Other Ambulatory Visit (HOSPITAL_COMMUNITY): Payer: Self-pay

## 2021-12-13 ENCOUNTER — Other Ambulatory Visit: Payer: Self-pay

## 2021-12-13 VITALS — BP 137/87 | HR 88 | Temp 98.2°F | Ht 64.0 in | Wt 124.5 lb

## 2021-12-13 DIAGNOSIS — Z789 Other specified health status: Secondary | ICD-10-CM

## 2021-12-13 DIAGNOSIS — F1090 Alcohol use, unspecified, uncomplicated: Secondary | ICD-10-CM

## 2021-12-13 DIAGNOSIS — E876 Hypokalemia: Secondary | ICD-10-CM

## 2021-12-13 DIAGNOSIS — Z720 Tobacco use: Secondary | ICD-10-CM

## 2021-12-13 DIAGNOSIS — F17210 Nicotine dependence, cigarettes, uncomplicated: Secondary | ICD-10-CM

## 2021-12-13 DIAGNOSIS — I10 Essential (primary) hypertension: Secondary | ICD-10-CM

## 2021-12-13 DIAGNOSIS — K7030 Alcoholic cirrhosis of liver without ascites: Secondary | ICD-10-CM

## 2021-12-13 DIAGNOSIS — F32A Depression, unspecified: Secondary | ICD-10-CM

## 2021-12-13 DIAGNOSIS — G2581 Restless legs syndrome: Secondary | ICD-10-CM

## 2021-12-13 MED ORDER — AMLODIPINE BESYLATE 5 MG PO TABS
5.0000 mg | ORAL_TABLET | Freq: Every day | ORAL | 11 refills | Status: AC
  Filled 2021-12-13: qty 30, 30d supply, fill #0
  Filled 2022-07-18: qty 30, 30d supply, fill #1
  Filled 2022-08-24: qty 30, 30d supply, fill #2
  Filled 2022-10-01 – 2022-10-21 (×2): qty 30, 30d supply, fill #3

## 2021-12-13 MED ORDER — NALTREXONE HCL 50 MG PO TABS
50.0000 mg | ORAL_TABLET | Freq: Every day | ORAL | 1 refills | Status: DC
  Filled 2021-12-13: qty 30, 30d supply, fill #0
  Filled 2022-07-18: qty 30, 30d supply, fill #1

## 2021-12-13 MED ORDER — CITALOPRAM HYDROBROMIDE 20 MG PO TABS
30.0000 mg | ORAL_TABLET | Freq: Every day | ORAL | 11 refills | Status: AC
  Filled 2021-12-13 – 2022-07-18 (×2): qty 30, 20d supply, fill #0
  Filled 2022-08-24: qty 30, 20d supply, fill #1
  Filled 2022-10-01 – 2022-10-21 (×2): qty 30, 20d supply, fill #2

## 2021-12-13 MED ORDER — NICOTINE 14 MG/24HR TD PT24
14.0000 mg | MEDICATED_PATCH | TRANSDERMAL | 1 refills | Status: DC
  Filled 2021-12-13: qty 28, 28d supply, fill #0
  Filled 2022-07-18: qty 28, 28d supply, fill #1

## 2021-12-13 NOTE — Patient Instructions (Signed)
Thank you, Kayla Sandoval for allowing [place] to provide your care today. Today we discussed:  Hypertension- Your blood pressure was high today. You are on lasix and spironolactone for your liver but these do not have the best blood pressure effects. We will start amlodipine 5 mg to be taken daily.  Tobacco use- We have prescribed '14mg'$  nicotine patches to be applied once daily for 6 weeks.  Alcohol use-We have prescribed Naltrexone '50mg'$  once daily to help. Please do not use opioid pain medications, heroin or fentanyl with this.  Cramps- Try pickle juice with cramps. If this does not work you can try taurine, an over the counter supplement. We will also work on getting your potassium to normal levels and will recheck this today.  I have ordered the following labs for you:   Lab Orders         BMP8+Anion Gap        Referrals ordered today:   Referral Orders  No referral(s) requested today     I have ordered the following medication/changed the following medications:   Stop the following medications: Medications Discontinued During This Encounter  Medication Reason   citalopram (CELEXA) 20 MG tablet Reorder     Start the following medications: Meds ordered this encounter  Medications   naltrexone (DEPADE) 50 MG tablet    Sig: Take 1 tablet (50 mg total) by mouth daily.    Dispense:  30 tablet    Refill:  1    IM program   citalopram (CELEXA) 20 MG tablet    Sig: Take 1.5 tablets (30 mg total) by mouth daily.    Dispense:  30 tablet    Refill:  11    IM program   amLODipine (NORVASC) 5 MG tablet    Sig: Take 1 tablet (5 mg total) by mouth daily.    Dispense:  30 tablet    Refill:  11    IM program   nicotine (NICODERM CQ - DOSED IN MG/24 HOURS) 14 mg/24hr patch    Sig: Place 1 patch (14 mg total) onto the skin daily.    Dispense:  30 patch    Refill:  1    IM program     Follow up: 2 months     We look forward to seeing you next time. Please call our clinic at  [phone] if you have any questions or concerns. The best time to call is Monday-Friday from 9am-4pm, but there is someone available 24/7. If after hours or the weekend, call the main hospital number and ask for the Internal Medicine Resident On-Call. If you need medication refills, please notify your pharmacy one week in advance and they will send [place] a request.   Thank you for trusting me with your care. Wishing you the best!   Iona Coach, MD [place]

## 2021-12-13 NOTE — Progress Notes (Deleted)
4 tall boys per week, really tasty scared of rabbit hole Non alcohol beer Naltrexone AST 118 4 cigarettes per day- stress  Celexa 20 increase to 30  The patient has macrocytosis, likely secondary to liver disease and alcohol use. Would benefit from B12 and folate labs in the future. Her upper extremity ecchymoses are in the setting of thrombocytopenia to 54, also likely secondary to liver disease. She does not have any joint or mucosal bleeding so this should be monitored for now. Potassium is low in the setting of not taking medications. This should improve with resuming spironolactone. Will notify patient to resume spironolactone and to come back for a 1 week lab follow up to check BMP.AST has increased with drinking alcohol after 2 years of abstinence.      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 Lyndel Safe who said if the work up at that time was ok she could see [place]. The patient interpreted that as she no longer needed follow up. Patient is currently drinking a 24 ounce tall boy a few times per week since breaking her collar bone in the summer. She does not with to go to AA and does not want any pharmacologic help with cessation at this time. Exam is without scleral icterus, juandice, abdominal distension indicative of ascites. She does have diffuse ecchymoses of the bilateral UE which may reflect thrombocytopenia from her liver disease. She needs variceal and [place] screening but does not have insurance. Will refer her to social work for help with medicaid application in [place] or help with other healthcare financial resources. Her HBV surface antibody titer is subtherapeutic and she will require vaccination. Patient not currently taking lactulose as it makes her feel bad. -Lasix 40 -Spiro 100 -gastroenterology referral -social work referral -CMP,CBC -Will need HBV vaccine -will need HCC and variceal screening     Restless leg syndrome        Patient reports night time leg cramping and the sensation of needing to move her legs. She says both are improved by standing up and walking. The patient deferred iron studies and vitamin D testing due to finances. She should have these done in the future to complete the workup. She also has a history of electrolyte abnormalities such as low magnesium which should be followed up if symptoms persist. Branch chain amino acids and taurine could be considered for cramping as well.

## 2021-12-14 LAB — BMP8+ANION GAP
Anion Gap: 17 mmol/L (ref 10.0–18.0)
BUN/Creatinine Ratio: 13 (ref 9–23)
BUN: 6 mg/dL (ref 6–24)
CO2: 28 mmol/L (ref 20–29)
Calcium: 9.2 mg/dL (ref 8.7–10.2)
Chloride: 89 mmol/L — ABNORMAL LOW (ref 96–106)
Creatinine, Ser: 0.46 mg/dL — ABNORMAL LOW (ref 0.57–1.00)
Glucose: 110 mg/dL — ABNORMAL HIGH (ref 70–99)
Potassium: 3.4 mmol/L — ABNORMAL LOW (ref 3.5–5.2)
Sodium: 134 mmol/L (ref 134–144)
eGFR: 116 mL/min/{1.73_m2} (ref >59)

## 2021-12-14 NOTE — Progress Notes (Signed)
Established Patient Office Visit  Subjective   Patient ID: Kayla Sandoval, female    DOB: [birth year]  Age: 51 y.o. MRN: [id number]  Chief Complaint  Patient presents with   Follow-up    FOR LAB WORK  / MEDICATION REFILL / PAIN # 5 - GENERALIZED    Kayla Sandoval is a 51 y/o female with a pmh outlined below. Please see encounter tab for HPI and A/P information.      Review of Systems  All other systems reviewed and are negative.     Objective:     BP 137/87 (BP Location: Right Arm, Patient Position: Sitting, Cuff Size: Small)   Pulse 88   Temp 98.2 F (36.8 C) (Oral)   Ht _0  (1.626 m)   Wt 124 lb 8 oz (56.5 kg)   SpO2 97%   BMI 21.37 kg/m    Physical Exam Constitutional:      General: She is not in acute distress.    Appearance: Normal appearance. She is normal weight.  Eyes:     General: No scleral icterus. Cardiovascular:     Rate and Rhythm: Normal rate and regular rhythm.     Pulses: Normal pulses.     Heart sounds: Normal heart sounds. No murmur heard.    No gallop.  Pulmonary:     Effort: Pulmonary effort is normal. No respiratory distress.     Breath sounds: Normal breath sounds. No wheezing or rales.  Abdominal:     General: Abdomen is flat. Bowel sounds are normal. There is no distension.     Palpations: Abdomen is soft.     Tenderness: There is no abdominal tenderness. There is no guarding.  Skin:    General: Skin is warm and dry.     Capillary Refill: Capillary refill takes less than 2 seconds.     Coloration: Skin is not jaundiced.  Neurological:     Mental Status: She is alert.      Results for orders placed or performed in visit on 12/13/21  BMP8+Anion Gap  Result Value Ref Range   Glucose 110 (H) 70 - 99 mg/dL   BUN 6 6 - 24 mg/dL   Creatinine, Ser 0.46 (L) 0.57 - 1.00 mg/dL   eGFR 116 >59 mL/min/1.73   BUN/Creatinine Ratio 13 9 - 23   Sodium 134 134 - 144 mmol/L   Potassium 3.4 (L) 3.5 - 5.2 mmol/L   Chloride 89 (L) 96 - 106  mmol/L   CO2 28 20 - 29 mmol/L   Anion Gap 17.0 10.0 - 18.0 mmol/L   Calcium 9.2 8.7 - 10.2 mg/dL      The ASCVD Risk score (Arnett DK, et al., 2019) failed to calculate for the following reasons:   Cannot find a previous HDL lab   Cannot find a previous total cholesterol lab    Assessment & Plan:   Problem List Items Addressed This Visit       Cardiovascular and Mediastinum   Hypertension - Primary    Patient continues to have elevated BP at 145/85. Unable to get cuff at home since last visit. She reports taking spiro 100 and lasix 40 for the past five days as she had previously run out. Both are fairly weak BP medications, but are prescribed for her cirrhosis. Given cost barriers will prescribe amlodipine 5 to avoid transportation costs and out of pocket lab costs for needing a follow up BMP. Also good with some non-adherence given the long  half life. -spiro 100 daily -lasix 40 daily -start amlodipine 5 mg       Relevant Medications   amLODipine (NORVASC) 5 MG tablet   nicotine (NICODERM CQ - DOSED IN MG/24 HOURS) 14 mg/24hr patch   Other Relevant Orders   BMP8+Anion Gap (Completed)     Digestive   Cirrhosis with alcoholism (HCC) (Chronic)   Relevant Medications   naltrexone (DEPADE) 50 MG tablet   Other Relevant Orders   BMP8+Anion Gap (Completed)     Other   Depression    Celexa increased to 30 mg daily.      Relevant Medications   citalopram (CELEXA) 20 MG tablet   Restless leg syndrome    Patient reports continued cramps in the leg and leg fidgeting at night that makes her get out of bed. Opted not to get iron studies or vitamin D at last visit. Will not prescribe lyrica at this time but will try to decrease anything contributing to cramps such as electrolyte abnormalities as this patient has a history of low potassium and magnesium. Her potassium at last visit was 3.2 and was 3.4 on repeat. I expect with continued spiro use that this will increase. She may need  an additional 11mq supplementation of potassium daily given lasix use. She was also counseled to try pickle juice for cramps and that she could get taurine if persistent.      Other Visit Diagnoses     Alcohol use       Relevant Medications   naltrexone (DEPADE) 50 MG tablet   Hypokalemia       Relevant Orders   BMP8+Anion Gap (Completed)   Tobacco use       Relevant Medications   nicotine (NICODERM CQ - DOSED IN MG/24 HOURS) 14 mg/24hr patch       Return in about 2 months (around 02/12/2022).    TIona Coach MD

## 2021-12-14 NOTE — Progress Notes (Signed)
Internal Medicine Clinic Attending  I saw and evaluated the patient.  I personally confirmed the key portions of the history and exam documented by Dr. Rogers and I reviewed pertinent patient test results.  The assessment, diagnosis, and plan were formulated together and I agree with the documentation in the resident's note.  

## 2021-12-14 NOTE — Progress Notes (Signed)
Attempted to call patient with no answer. Will send my chart message and attempt later. Potassium is improving with resuming spironolactone. It is at a safe level at the moment although slightly low. Will consider potassium replacement given concurrent use of Lasix if potassium remains low at follow up despite adherence to spironolactone.

## 2021-12-14 NOTE — Assessment & Plan Note (Signed)
Patient continues to have elevated BP at 145/85. Unable to get cuff at home since last visit. She reports taking spiro 100 and lasix 40 for the past five days as she had previously run out. Both are fairly weak BP medications, but are prescribed for her cirrhosis. Given cost barriers will prescribe amlodipine 5 to avoid transportation costs and out of pocket lab costs for needing a follow up BMP. Also good with some non-adherence given the long half life. -spiro 100 daily -lasix 40 daily -start amlodipine 5 mg

## 2021-12-14 NOTE — Assessment & Plan Note (Signed)
Celexa increased to 30 mg daily.

## 2021-12-14 NOTE — Assessment & Plan Note (Addendum)
Patient reports continued cramps in the leg and leg fidgeting at night that makes her get out of bed. Opted not to get iron studies or vitamin D at last visit. Will not prescribe lyrica at this time but will try to decrease anything contributing to cramps such as electrolyte abnormalities as this patient has a history of low potassium and magnesium. Her potassium at last visit was 3.2 and was 3.4 on repeat. I expect with continued spiro use that this will increase. She may need an additional 24mq supplementation of potassium daily given lasix use. She was also counseled to try pickle juice for cramps and that she could get taurine if persistent.

## 2022-07-18 ENCOUNTER — Other Ambulatory Visit: Payer: Self-pay

## 2022-07-18 ENCOUNTER — Other Ambulatory Visit (HOSPITAL_COMMUNITY): Payer: Self-pay

## 2022-07-18 DIAGNOSIS — G4701 Insomnia due to medical condition: Secondary | ICD-10-CM

## 2022-07-18 DIAGNOSIS — K7030 Alcoholic cirrhosis of liver without ascites: Secondary | ICD-10-CM

## 2022-07-18 DIAGNOSIS — K7031 Alcoholic cirrhosis of liver with ascites: Secondary | ICD-10-CM

## 2022-07-22 ENCOUNTER — Other Ambulatory Visit (HOSPITAL_COMMUNITY): Payer: Self-pay

## 2022-07-22 MED ORDER — TRAZODONE HCL 50 MG PO TABS
50.0000 mg | ORAL_TABLET | Freq: Every evening | ORAL | 0 refills | Status: DC | PRN
  Filled 2022-07-22: qty 30, 30d supply, fill #0

## 2022-07-22 MED ORDER — LACTULOSE 10 GM/15ML PO SOLN
20.0000 g | Freq: Two times a day (BID) | ORAL | 0 refills | Status: AC
  Filled 2022-07-22: qty 1419, 24d supply, fill #0
  Filled 2022-08-24: qty 473, 8d supply, fill #1

## 2022-07-25 ENCOUNTER — Encounter (HOSPITAL_COMMUNITY): Payer: Self-pay | Admitting: Emergency Medicine

## 2022-07-25 ENCOUNTER — Emergency Department (HOSPITAL_COMMUNITY): Payer: Self-pay

## 2022-07-25 ENCOUNTER — Emergency Department (HOSPITAL_COMMUNITY)
Admission: EM | Admit: 2022-07-25 | Discharge: 2022-07-25 | Disposition: A | Discharge status: Home / Self Care | Payer: Self-pay | Attending: Emergency Medicine | Admitting: Emergency Medicine

## 2022-07-25 ENCOUNTER — Other Ambulatory Visit: Payer: Self-pay

## 2022-07-25 ENCOUNTER — Encounter (HOSPITAL_COMMUNITY): Payer: Self-pay

## 2022-07-25 DIAGNOSIS — F10220 Alcohol dependence with intoxication, uncomplicated: Secondary | ICD-10-CM | POA: Insufficient documentation

## 2022-07-25 DIAGNOSIS — S0990XA Unspecified injury of head, initial encounter: Secondary | ICD-10-CM

## 2022-07-25 DIAGNOSIS — W16012A Fall into swimming pool striking water surface causing other injury, initial encounter: Secondary | ICD-10-CM | POA: Insufficient documentation

## 2022-07-25 DIAGNOSIS — Z79899 Other long term (current) drug therapy: Secondary | ICD-10-CM | POA: Insufficient documentation

## 2022-07-25 DIAGNOSIS — W19XXXA Unspecified fall, initial encounter: Secondary | ICD-10-CM | POA: Insufficient documentation

## 2022-07-25 DIAGNOSIS — Y9389 Activity, other specified: Secondary | ICD-10-CM | POA: Insufficient documentation

## 2022-07-25 DIAGNOSIS — S20211A Contusion of right front wall of thorax, initial encounter: Secondary | ICD-10-CM

## 2022-07-25 DIAGNOSIS — Y908 Blood alcohol level of 240 mg/100 ml or more: Secondary | ICD-10-CM | POA: Insufficient documentation

## 2022-07-25 DIAGNOSIS — S0101XA Laceration without foreign body of scalp, initial encounter: Secondary | ICD-10-CM | POA: Insufficient documentation

## 2022-07-25 DIAGNOSIS — Z23 Encounter for immunization: Secondary | ICD-10-CM | POA: Insufficient documentation

## 2022-07-25 DIAGNOSIS — S50811A Abrasion of right forearm, initial encounter: Secondary | ICD-10-CM

## 2022-07-25 DIAGNOSIS — R000 Tachycardia, unspecified: Secondary | ICD-10-CM | POA: Insufficient documentation

## 2022-07-25 DIAGNOSIS — F10920 Alcohol use, unspecified with intoxication, uncomplicated: Secondary | ICD-10-CM

## 2022-07-25 LAB — ETHANOL: Alcohol, Ethyl (B): 332 mg/dL (ref <10)

## 2022-07-25 LAB — CBC WITH DIFFERENTIAL/PLATELET
Abs Immature Granulocytes: 0.03 10*3/uL (ref 0.00–0.07)
Basophils Absolute: 0.1 10*3/uL (ref 0.0–0.1)
Basophils Relative: 2 %
Eosinophils Absolute: 0.1 10*3/uL (ref 0.0–0.5)
Eosinophils Relative: 1 %
HCT: 40.4 % (ref 36.0–46.0)
Hemoglobin: 14.4 g/dL (ref 12.0–15.0)
Immature Granulocytes: 0 %
Lymphocytes Relative: 15 %
Lymphs Abs: 1 10*3/uL (ref 0.7–4.0)
MCH: 37.2 pg — ABNORMAL HIGH (ref 26.0–34.0)
MCHC: 35.6 g/dL (ref 30.0–36.0)
MCV: 104.4 fL — ABNORMAL HIGH (ref 80.0–100.0)
Monocytes Absolute: 0.8 10*3/uL (ref 0.1–1.0)
Monocytes Relative: 11 %
Neutro Abs: 5 10*3/uL (ref 1.7–7.7)
Neutrophils Relative %: 71 %
Platelets: 151 10*3/uL (ref 150–400)
RBC: 3.87 MIL/uL (ref 3.87–5.11)
RDW: 13.4 % (ref 11.5–15.5)
WBC: 6.9 10*3/uL (ref 4.0–10.5)
nRBC: 0 % (ref 0.0–0.2)

## 2022-07-25 LAB — COMPREHENSIVE METABOLIC PANEL
ALT: 30 U/L (ref 0–44)
AST: 101 U/L — ABNORMAL HIGH (ref 15–41)
Albumin: 3.5 g/dL (ref 3.5–5.0)
Alkaline Phosphatase: 130 U/L — ABNORMAL HIGH (ref 38–126)
Anion gap: 16 — ABNORMAL HIGH (ref 5–15)
BUN: 6 mg/dL (ref 6–20)
CO2: 21 mmol/L — ABNORMAL LOW (ref 22–32)
Calcium: 8.4 mg/dL — ABNORMAL LOW (ref 8.9–10.3)
Chloride: 98 mmol/L (ref 98–111)
Creatinine, Ser: 0.64 mg/dL (ref 0.44–1.00)
GFR, Estimated: >60 mL/min (ref >60)
Glucose, Bld: 112 mg/dL — ABNORMAL HIGH (ref 70–99)
Potassium: 3.8 mmol/L (ref 3.5–5.1)
Sodium: 135 mmol/L (ref 135–145)
Total Bilirubin: 2.8 mg/dL — ABNORMAL HIGH (ref 0.3–1.2)
Total Protein: 7.4 g/dL (ref 6.5–8.1)

## 2022-07-25 MED ORDER — MAGNESIUM SULFATE 2 GM/50ML IV SOLN: 2.0000 g | Freq: Once | INTRAVENOUS | Status: DC

## 2022-07-25 MED ORDER — ACETAMINOPHEN 325 MG PO TABS
650.0000 mg | ORAL_TABLET | Freq: Once | ORAL | Status: AC
  Administered 2022-07-25: 650 mg via ORAL
  Filled 2022-07-25: qty 2

## 2022-07-25 MED ORDER — SODIUM CHLORIDE 0.9 % IV BOLUS
1000.0000 mL | Freq: Once | INTRAVENOUS | Status: AC
  Administered 2022-07-25: 1000 mL via INTRAVENOUS

## 2022-07-25 MED ORDER — HYDROGEN PEROXIDE 3 % EX SOLN
CUTANEOUS | Status: AC
  Administered 2022-07-25: 1
  Filled 2022-07-25: qty 473

## 2022-07-25 MED ORDER — LIDOCAINE-EPINEPHRINE (PF) 2 %-1:200000 IJ SOLN
20.0000 mL | Freq: Once | INTRAMUSCULAR | Status: AC
  Administered 2022-07-25: 20 mL
  Filled 2022-07-25: qty 20

## 2022-07-25 MED ORDER — IBUPROFEN 400 MG PO TABS
600.0000 mg | ORAL_TABLET | Freq: Once | ORAL | Status: AC
  Administered 2022-07-25: 600 mg via ORAL
  Filled 2022-07-25: qty 2

## 2022-07-25 MED ORDER — TETANUS-DIPHTH-ACELL PERTUSSIS 5-2.5-18.5 LF-MCG/0.5 IM SUSY
0.5000 mL | PREFILLED_SYRINGE | Freq: Once | INTRAMUSCULAR | Status: AC
  Administered 2022-07-25: 0.5 mL via INTRAMUSCULAR
  Filled 2022-07-25: qty 0.5

## 2022-07-25 NOTE — ED Triage Notes (Signed)
Patient BIB [facility]. Patient was walking up a hill and was tripped by her shoes and fell backwards into bush. Patient unsure if she lost consciousness. Patient seen earlier today for fall and discharged about an hour ago. Denies precipitating event. AOx4, follows commands, strength equal bilaterally, ambulatory.

## 2022-07-25 NOTE — ED Triage Notes (Addendum)
Pt was playing with her grandkids this morning and slipped near an above ground pool. Admits to drinking ETOH afterwards but denies alcohol intake prior to fall. Pt landed on her right side and thinks she may have broken a rib or two. Small abrasions noted to right forearm, bleeding controlled. No SOB. Last tetanus shot was almost 20 years ago

## 2022-07-25 NOTE — Discharge Instructions (Addendum)
Use Ibuprofen and/or Tylenol for pain. If you develop new or worsening pain, trouble breathing, or any other new/concerning symptoms then return to the ER or call 911.

## 2022-07-25 NOTE — ED Provider Notes (Signed)
[place] EMERGENCY DEPARTMENT AT [facility] Provider Note   CSN: [id number] Arrival date & time: 07/25/22  1139     History  Chief Complaint  Patient presents with   Fall    Kayla Sandoval is a 52 y.o. female.  HPI 52 year old female presents with a fall and right chest injury. This morning she was trying to get into the poll and slipped and hit the side of the pool on her right forearm and right ribs. No LOC. No head injury. No dyspnea but it hurts a lot to breath. She came home and drank 2 beers but was still in significant pain so she came here. Thinks her last Tdap was about 20+ years ago.   Home Medications Prior to Admission medications   Medication Sig Start Date End Date Taking? Authorizing Provider  amLODipine (NORVASC) 5 MG tablet Take 1 tablet (5 mg total) by mouth daily. 12/13/21 12/13/22  Willette Cluster, MD  citalopram (CELEXA) 20 MG tablet Take 1&1/2 tablets (30 mg total) by mouth daily. 12/13/21   Willette Cluster, MD  diclofenac Sodium (VOLTAREN) 1 % GEL Apply 2 g topically to hands 4 (four) times daily as needed. 08/30/21   Willette Cluster, MD  furosemide (LASIX) 40 MG tablet Take 1 tablet (40 mg total) by mouth 2 (two) times daily. 12/03/21   Willette Cluster, MD  lactulose (CHRONULAC) 10 GM/15ML solution Take 30 mLs (20 g total) by mouth 2 (two) times daily. 07/22/22   Willette Cluster, MD  Melatonin 10 MG TABS Take 10 mg by mouth at bedtime as needed (sleep). 03/20/20   Roylene Reason, MD  naltrexone (DEPADE) 50 MG tablet Take 1 tablet (50 mg total) by mouth daily. 12/13/21   Willette Cluster, MD  nicotine (NICODERM CQ - DOSED IN MG/24 HOURS) 14 mg/24hr patch Place 1 patch (14 mg total) onto the skin daily. 12/13/21   Willette Cluster, MD  pantoprazole (PROTONIX) 40 MG tablet Take 1 tablet (40 mg total) by mouth daily. 12/07/21   Masters, Florentina Addison, DO  spironolactone (ALDACTONE) 100 MG tablet Take 1 tablet (100 mg total) by mouth 2 (two) times daily. 12/03/21   Willette Cluster, MD   traZODone (DESYREL) 50 MG tablet Take 1 tablet (50 mg total) by mouth at bedtime as needed for sleep. 07/22/22   Willette Cluster, MD      Allergies    Molds & smuts    Review of Systems   Review of Systems  Respiratory:  Negative for shortness of breath.   Cardiovascular:  Positive for chest pain.  Gastrointestinal:  Negative for abdominal pain.  Musculoskeletal:  Negative for arthralgias.  Skin:  Positive for wound.  Neurological:  Negative for headaches.    Physical Exam Updated Vital Signs BP (!) 155/109 (BP Location: Right Arm)   Pulse 97   Temp 98.2 F (36.8 C) (Oral)   Resp 16   Ht 5\' 4"  (1.626 m)   Wt 56.7 kg   SpO2 94%   BMI 21.46 kg/m  Physical Exam Vitals and nursing note reviewed.  Constitutional:      Appearance: She is well-developed.  HENT:     Head: Normocephalic and atraumatic.  Cardiovascular:     Rate and Rhythm: Normal rate and regular rhythm.     Pulses:          Radial pulses are 2+ on the right side.     Heart sounds: Normal heart sounds.  Pulmonary:     Effort:  Pulmonary effort is normal.     Breath sounds: Normal breath sounds.  Chest:     Chest wall: Tenderness present.    Abdominal:     General: There is no distension.     Palpations: Abdomen is soft.     Tenderness: There is no abdominal tenderness.  Musculoskeletal:     Right forearm: Laceration (linear abrasion over mid-ulnar forearm) and tenderness (mild, mostly over abrasion) present. No swelling or deformity.  Skin:    General: Skin is warm and dry.  Neurological:     Mental Status: She is alert.     ED Results / Procedures / Treatments   Labs (all labs ordered are listed, but only abnormal results are displayed) Labs Reviewed - No data to display  EKG None  Radiology DG Ribs Unilateral W/Chest Right  Result Date: 07/25/2022 CLINICAL DATA:  Fall with right rib pain EXAM: RIGHT RIBS AND CHEST - 4 VIEW COMPARISON:  None Available. FINDINGS: No acute fracture or other  bone lesions are seen involving the ribs. Old bilateral rib fractures. There is no evidence of pneumothorax or pleural effusion. Both lungs are clear. Heart size and mediastinal contours are within normal limits. IMPRESSION: No acute rib fracture. Old bilateral rib fractures. Electronically Signed   By: Agustin Cree M.D.   On: 07/25/2022 13:14    Procedures Procedures    Medications Ordered in ED Medications  Tdap (BOOSTRIX) injection 0.5 mL (0.5 mLs Intramuscular Given 07/25/22 1223)  ibuprofen (ADVIL) tablet 600 mg (600 mg Oral Given 07/25/22 1221)  acetaminophen (TYLENOL) tablet 650 mg (650 mg Oral Given 07/25/22 1221)    ED Course/ Medical Decision Making/ A&P                             Medical Decision Making Amount and/or Complexity of Data Reviewed Radiology: ordered and independent interpretation performed.    Details: No pneumothorax.  Risk OTC drugs. Prescription drug management.   Patient presents with a blunt chest injury.  Small bruise but otherwise no evidence of pneumothorax or obvious rib fracture.  She has declined an x-ray of her forearm which only shows a mild abrasion.  Tdap was updated.  No signs of a head injury.  Seems like she has a little bit of alcohol on board but throughout her ED stay seems to be doing well and is not clinically intoxicated at this point.  No further imaging needed.  She feels a lot better with ibuprofen, Tylenol, and ice.  Will discharge home with return precautions.        Final Clinical Impression(s) / ED Diagnoses Final diagnoses:  Rib contusion, right, initial encounter  Abrasion of right forearm, initial encounter    Rx / DC Orders ED Discharge Orders     None         Pricilla Loveless, MD 07/25/22 1557

## 2022-07-25 NOTE — ED Provider Notes (Signed)
[place] EMERGENCY DEPARTMENT AT [facility] Provider Note   CSN: [id number] Arrival date & time: 07/25/22  1547     History  Chief Complaint  Patient presents with   Fall    Kayla Sandoval is a 52 y.o. female.  She was just seen earlier today for evaluation of right chest injury after a fall.  She said she was walking in the woods and fell again and struck her head.  Complaining of head pain.  Was brought here by EMS.  No loss of consciousness.  She denies any other injuries.  She is not on blood thinners.  The history is provided by the patient and the EMS personnel.  Fall This is a recurrent problem. The current episode started less than 1 hour ago. The problem has not changed since onset.Associated symptoms include chest pain and headaches. Pertinent negatives include no abdominal pain. Nothing aggravates the symptoms. Nothing relieves the symptoms. She has tried nothing for the symptoms. The treatment provided no relief.       Home Medications Prior to Admission medications   Medication Sig Start Date End Date Taking? Authorizing Provider  amLODipine (NORVASC) 5 MG tablet Take 1 tablet (5 mg total) by mouth daily. 12/13/21 12/13/22  Willette Cluster, MD  citalopram (CELEXA) 20 MG tablet Take 1&1/2 tablets (30 mg total) by mouth daily. 12/13/21   Willette Cluster, MD  diclofenac Sodium (VOLTAREN) 1 % GEL Apply 2 g topically to hands 4 (four) times daily as needed. 08/30/21   Willette Cluster, MD  furosemide (LASIX) 40 MG tablet Take 1 tablet (40 mg total) by mouth 2 (two) times daily. 12/03/21   Willette Cluster, MD  lactulose (CHRONULAC) 10 GM/15ML solution Take 30 mLs (20 g total) by mouth 2 (two) times daily. 07/22/22   Willette Cluster, MD  Melatonin 10 MG TABS Take 10 mg by mouth at bedtime as needed (sleep). 03/20/20   Roylene Reason, MD  naltrexone (DEPADE) 50 MG tablet Take 1 tablet (50 mg total) by mouth daily. 12/13/21   Willette Cluster, MD  nicotine (NICODERM CQ - DOSED IN  MG/24 HOURS) 14 mg/24hr patch Place 1 patch (14 mg total) onto the skin daily. 12/13/21   Willette Cluster, MD  pantoprazole (PROTONIX) 40 MG tablet Take 1 tablet (40 mg total) by mouth daily. 12/07/21   Masters, Florentina Addison, DO  spironolactone (ALDACTONE) 100 MG tablet Take 1 tablet (100 mg total) by mouth 2 (two) times daily. 12/03/21   Willette Cluster, MD  traZODone (DESYREL) 50 MG tablet Take 1 tablet (50 mg total) by mouth at bedtime as needed for sleep. 07/22/22   Willette Cluster, MD      Allergies    Molds & smuts    Review of Systems   Review of Systems  Constitutional:  Negative for fever.  Eyes:  Negative for visual disturbance.  Cardiovascular:  Positive for chest pain.  Gastrointestinal:  Negative for abdominal pain.  Skin:  Positive for wound.  Neurological:  Positive for headaches.    Physical Exam Updated Vital Signs BP (!) 126/92 (BP Location: Left Arm)   Pulse (!) 124   Temp 98.7 F (37.1 C) (Oral)   Resp 18   Ht 5\' 4"  (1.626 m)   Wt 56.7 kg   SpO2 92%   BMI 21.46 kg/m  Physical Exam Vitals and nursing note reviewed.  Constitutional:      General: She is not in acute distress.    Appearance: Normal appearance. She is  well-developed.  HENT:     Head: Normocephalic.     Comments: She has a 3 cm laceration on her occipital area with surrounding hematoma Eyes:     Conjunctiva/sclera: Conjunctivae normal.  Cardiovascular:     Rate and Rhythm: Regular rhythm. Tachycardia present.     Heart sounds: No murmur heard. Pulmonary:     Effort: Pulmonary effort is normal. No respiratory distress.     Breath sounds: Normal breath sounds.  Abdominal:     Palpations: Abdomen is soft.     Tenderness: There is no abdominal tenderness. There is no guarding or rebound.  Musculoskeletal:        General: No deformity. Normal range of motion.     Cervical back: Neck supple.  Skin:    General: Skin is warm and dry.     Capillary Refill: Capillary refill takes less than 2 seconds.   Neurological:     General: No focal deficit present.     Mental Status: She is alert and oriented to person, place, and time.     Cranial Nerves: No cranial nerve deficit.     Sensory: No sensory deficit.     Motor: No weakness.     ED Results / Procedures / Treatments   Labs (all labs ordered are listed, but only abnormal results are displayed) Labs Reviewed  COMPREHENSIVE METABOLIC PANEL - Abnormal; Notable for the following components:      Result Value   CO2 21 (*)    Glucose, Bld 112 (*)    Calcium 8.4 (*)    AST 101 (*)    Alkaline Phosphatase 130 (*)    Total Bilirubin 2.8 (*)    Anion gap 16 (*)    All other components within normal limits  CBC WITH DIFFERENTIAL/PLATELET - Abnormal; Notable for the following components:   MCV 104.4 (*)    MCH 37.2 (*)    All other components within normal limits  ETHANOL - Abnormal; Notable for the following components:   Alcohol, Ethyl (B) 332 (*)    All other components within normal limits    EKG None  Radiology CT Head Wo Contrast  Result Date: 07/25/2022 CLINICAL DATA:  Head trauma, abnormal mental status (Age 68-64y) EXAM: CT HEAD WITHOUT CONTRAST TECHNIQUE: Contiguous axial images were obtained from the base of the skull through the vertex without intravenous contrast. RADIATION DOSE REDUCTION: This exam was performed according to the departmental dose-optimization program which includes automated exposure control, adjustment of the mA and/or kV according to patient size and/or use of iterative reconstruction technique. COMPARISON:  CT Head 02/13/16 FINDINGS: Brain: No evidence of acute infarction, hemorrhage, hydrocephalus, extra-axial collection or mass lesion/mass effect. Vascular: No hyperdense vessel or unexpected calcification. Skull: Soft tissue laceration and hematoma along the left parietal scalp with skin staples in place. No evidence of underlying calvarial fracture. Sinuses/Orbits: No middle ear or mastoid effusion.  Paranasal sinuses are notable for frothy secretions in the right maxillary sinus, which can be seen in the setting of acute sinusitis. Orbits are unremarkable. Other: None. IMPRESSION: 1. No acute intracranial abnormality. 2. Soft tissue laceration and hematoma along the left parietal scalp with skin staples in place. No evidence of underlying calvarial fracture. 3. Frothy secretions in the right maxillary sinus, which can be seen in the setting of acute sinusitis. Electronically Signed   By: Lorenza Cambridge M.D.   On: 07/25/2022 17:34   DG Ribs Unilateral W/Chest Right  Result Date: 07/25/2022 CLINICAL DATA:  Fall with right rib pain EXAM: RIGHT RIBS AND CHEST - 4 VIEW COMPARISON:  None Available. FINDINGS: No acute fracture or other bone lesions are seen involving the ribs. Old bilateral rib fractures. There is no evidence of pneumothorax or pleural effusion. Both lungs are clear. Heart size and mediastinal contours are within normal limits. IMPRESSION: No acute rib fracture. Old bilateral rib fractures. Electronically Signed   By: Agustin Cree M.D.   On: 07/25/2022 13:14    Procedures .Marland KitchenLaceration Repair  Date/Time: 07/25/2022 4:39 PM  Performed by: Terrilee Files, MD Authorized by: Terrilee Files, MD   Consent:    Consent obtained:  Verbal   Consent given by:  Patient   Risks, benefits, and alternatives were discussed: yes     Risks discussed:  Infection, nerve damage, poor wound healing, pain, retained foreign body, tendon damage and vascular damage   Alternatives discussed:  No treatment, delayed treatment and referral Universal protocol:    Procedure explained and questions answered to patient or proxy's satisfaction: yes     Patient identity confirmed:  Verbally with patient Anesthesia:    Anesthesia method:  Local infiltration   Local anesthetic:  Lidocaine 2% WITH epi Laceration details:    Location:  Scalp   Scalp location:  Crown   Length (cm):  4 Treatment:    Area cleansed  with:  Saline   Amount of cleaning:  Standard   Irrigation solution:  Sterile saline   Debridement:  None Skin repair:    Repair method:  Staples   Number of staples:  4 Approximation:    Approximation:  Close Repair type:    Repair type:  Simple Post-procedure details:    Dressing:  Open (no dressing)   Procedure completion:  Tolerated well, no immediate complications     Medications Ordered in ED Medications  hydrogen peroxide 3 % external solution (has no administration in time range)  lidocaine-EPINEPHrine (XYLOCAINE W/EPI) 2 %-1:200000 (PF) injection 20 mL (has no administration in time range)  sodium chloride 0.9 % bolus 1,000 mL (has no administration in time range)    ED Course/ Medical Decision Making/ A&P Clinical Course as of 07/26/22 1030  Thu Jul 25, 2022  1750 Patient's alcohol level came back significantly elevated at 332.  She is awake alert and stable on her feet.  She has a family member to drive her home.  Head CT does not show any intracranial findings I think she is appropriate for discharge.  Counseled patient to cut back on her alcohol use and consider getting help with her alcohol use [MB]    Clinical Course User Index [MB] Terrilee Files, MD                             Medical Decision Making Amount and/or Complexity of Data Reviewed Labs: ordered. Radiology: ordered.  Risk Prescription drug management.   This patient complains of fall head injury laceration,  this involves an extensive number of treatment Options and is a complaint that carries with it a high risk of complications and morbidity. The differential includes contusion, fracture, intracranial bleed, intoxication, metabolic derangement;  I ordered, reviewed and interpreted labs, which included CBC with normal white count normal hemoglobin, chemistries with mildly low bicarb elevated glucose, LFTs elevated I ordered medication IV fluids and reviewed PMP when indicated. I ordered  imaging studies which included head CT and I independently    visualized and  interpreted imaging which showed no intracranial findings, does have hematoma and laceration Additional history obtained from patient's family member Previous records obtained and reviewed in epic including prior ED visit today  Cardiac monitoring reviewed, sinus tachycardia improving to normal sinus rhythm Social determinants considered, ongoing tobacco use alcohol use depression Critical Interventions: None  After the interventions stated above, I reevaluated the patient and found patient to be awake alert stable on her feet Admission and further testing considered, no indications for admission.  She will be discharged to family member who is able to drive patient and get her home safely.  We had a long discussion regarding her alcohol use and its impact upon her health.  She was given resources if she wishes to pursue help with her alcoholism.  Return instructions discussed         Final Clinical Impression(s) / ED Diagnoses Final diagnoses:  Fall, initial encounter  Injury of head, initial encounter  Laceration of scalp, initial encounter  Alcoholic intoxication without complication [facility])    Rx / DC Orders ED Discharge Orders     None         Terrilee Files, MD 07/26/22 1034

## 2022-07-25 NOTE — ED Notes (Signed)
Pt "needed a cigarette" and decided to wait for her ride outside. Pt stated she understood instructions and had no other questions. Pt was tearful and stated, "sorry I was a waste of your time." Rn consoled pt and educated her that her health and wellbeing is not a waste of anyone's time and she should be seen if she feels it is necessary. RN escorted pt outside and visualized pt ambulate steady and even to the bench on the right side of the door. Pt is a&ox4.

## 2022-07-25 NOTE — Discharge Instructions (Signed)
You are seen in the emergency department for evaluation of injuries after a fall.  You had a scalp laceration that we stapled and these will need to be removed in 7 to 10 days.  You may shower and use soap and water.  You were also very intoxicated and should consider limiting your alcohol intake and getting help with your alcohol use.  Tylenol for pain.  Return to the emergency department if any worsening or concerning symptoms

## 2022-07-25 NOTE — ED Notes (Signed)
Pt waiting for ride to dc. Pt sleepy. RN suggest pt wait in room to reduce the risk of further injury.

## 2022-07-25 NOTE — ED Notes (Signed)
Head irrigated and cleaned. Bleeding controlled after staples.

## 2022-08-05 ENCOUNTER — Emergency Department (HOSPITAL_COMMUNITY)
Admission: EM | Admit: 2022-08-05 | Discharge: 2022-08-05 | Disposition: A | Discharge status: Home / Self Care | Payer: Self-pay | Attending: Emergency Medicine | Admitting: Emergency Medicine

## 2022-08-05 ENCOUNTER — Encounter (HOSPITAL_COMMUNITY): Payer: Self-pay | Admitting: Emergency Medicine

## 2022-08-05 ENCOUNTER — Other Ambulatory Visit: Payer: Self-pay

## 2022-08-05 DIAGNOSIS — Z4802 Encounter for removal of sutures: Secondary | ICD-10-CM | POA: Insufficient documentation

## 2022-08-05 NOTE — ED Triage Notes (Signed)
Pt via POV requesting staple removal after lac repair on 6/13. Pt says wound is healing well with no complications.

## 2022-08-05 NOTE — Discharge Instructions (Addendum)
Your staples were removed today.  For any concerning symptoms return to the emergency room.

## 2022-08-05 NOTE — ED Provider Notes (Signed)
[place] EMERGENCY DEPARTMENT AT [facility] Provider Note   CSN: [id number] Arrival date & time: 08/05/22  1339     History  Chief Complaint  Patient presents with   Suture / Staple Removal    Kayla Sandoval is a 52 y.o. female.  52 year old female presents today for staple removal.  She had a fall on 6/13.  Had 4 staples placed.  Denies any concerning symptoms since then.  States wound is healing well.  No drainage.  The history is provided by the patient. No language interpreter was used.       Home Medications Prior to Admission medications   Medication Sig Start Date End Date Taking? Authorizing Provider  amLODipine (NORVASC) 5 MG tablet Take 1 tablet (5 mg total) by mouth daily. 12/13/21 12/13/22  Willette Cluster, MD  citalopram (CELEXA) 20 MG tablet Take 1&1/2 tablets (30 mg total) by mouth daily. 12/13/21   Willette Cluster, MD  diclofenac Sodium (VOLTAREN) 1 % GEL Apply 2 g topically to hands 4 (four) times daily as needed. 08/30/21   Willette Cluster, MD  furosemide (LASIX) 40 MG tablet Take 1 tablet (40 mg total) by mouth 2 (two) times daily. 12/03/21   Willette Cluster, MD  lactulose (CHRONULAC) 10 GM/15ML solution Take 30 mLs (20 g total) by mouth 2 (two) times daily. 07/22/22   Willette Cluster, MD  Melatonin 10 MG TABS Take 10 mg by mouth at bedtime as needed (sleep). 03/20/20   Roylene Reason, MD  naltrexone (DEPADE) 50 MG tablet Take 1 tablet (50 mg total) by mouth daily. 12/13/21   Willette Cluster, MD  nicotine (NICODERM CQ - DOSED IN MG/24 HOURS) 14 mg/24hr patch Place 1 patch (14 mg total) onto the skin daily. 12/13/21   Willette Cluster, MD  pantoprazole (PROTONIX) 40 MG tablet Take 1 tablet (40 mg total) by mouth daily. 12/07/21   Masters, Florentina Addison, DO  spironolactone (ALDACTONE) 100 MG tablet Take 1 tablet (100 mg total) by mouth 2 (two) times daily. 12/03/21   Willette Cluster, MD  traZODone (DESYREL) 50 MG tablet Take 1 tablet (50 mg total) by mouth at bedtime as needed for  sleep. 07/22/22   Willette Cluster, MD      Allergies    Molds & smuts    Review of Systems   Review of Systems  Constitutional:  Negative for fever.  Skin:  Positive for wound.  All other systems reviewed and are negative.   Physical Exam Updated Vital Signs BP 128/83 (BP Location: Left Arm)   Pulse 87   Temp 98.4 F (36.9 C) (Oral)   Resp 12   Ht 5\' 4"  (1.626 m)   Wt 59 kg   SpO2 97%   BMI 22.31 kg/m  Physical Exam Vitals and nursing note reviewed.  Constitutional:      General: She is not in acute distress.    Appearance: Normal appearance. She is not ill-appearing.  HENT:     Head: Normocephalic and atraumatic.     Comments: 4 staples noted to left side of the crown of head.  Wound is healing well.  No signs of infection.    Nose: Nose normal.  Eyes:     Conjunctiva/sclera: Conjunctivae normal.  Pulmonary:     Effort: Pulmonary effort is normal. No respiratory distress.  Musculoskeletal:        General: No deformity.  Skin:    Findings: No rash.  Neurological:     Mental Status: She is alert.  ED Results / Procedures / Treatments   Labs (all labs ordered are listed, but only abnormal results are displayed) Labs Reviewed - No data to display  EKG None  Radiology No results found.  Procedures .Suture Removal  Date/Time: 08/05/2022 3:16 PM  Performed by: Marita Kansas, PA-C Authorized by: Marita Kansas, PA-C   Consent:    Consent obtained:  Verbal   Consent given by:  Patient   Risks discussed:  Bleeding, pain and wound separation   Alternatives discussed:  No treatment Universal protocol:    Procedure explained and questions answered to patient or proxy's satisfaction: yes     Relevant documents present and verified: yes     Test results available: yes     Patient identity confirmed:  Verbally with patient and arm band Location:    Location:  Head/neck   Head/neck location:  Scalp Procedure details:    Wound appearance:  No signs of infection    Number of staples removed:  4 Post-procedure details:    Post-removal:  No dressing applied   Procedure completion:  Tolerated well, no immediate complications     Medications Ordered in ED Medications - No data to display  ED Course/ Medical Decision Making/ A&P                             Medical Decision Making  52 year old female presents for staple removal.  She is in agreement for me to remove these.  No signs of infection noted.  Wound is well-healing.   Final Clinical Impression(s) / ED Diagnoses Final diagnoses:  Encounter for staple removal    Rx / DC Orders ED Discharge Orders     None         Marita Kansas, PA-C 08/05/22 1517    Gloris Manchester, MD 08/06/22 [phone]

## 2022-08-24 ENCOUNTER — Other Ambulatory Visit: Payer: Self-pay

## 2022-08-24 DIAGNOSIS — G4701 Insomnia due to medical condition: Secondary | ICD-10-CM

## 2022-08-24 DIAGNOSIS — K7030 Alcoholic cirrhosis of liver without ascites: Secondary | ICD-10-CM

## 2022-08-24 DIAGNOSIS — Z789 Other specified health status: Secondary | ICD-10-CM

## 2022-08-24 DIAGNOSIS — Z720 Tobacco use: Secondary | ICD-10-CM

## 2022-08-24 DIAGNOSIS — I10 Essential (primary) hypertension: Secondary | ICD-10-CM

## 2022-08-26 ENCOUNTER — Other Ambulatory Visit (HOSPITAL_COMMUNITY): Payer: Self-pay

## 2022-08-26 ENCOUNTER — Other Ambulatory Visit: Payer: Self-pay | Admitting: Student

## 2022-08-26 ENCOUNTER — Other Ambulatory Visit: Payer: Self-pay

## 2022-08-26 DIAGNOSIS — G4701 Insomnia due to medical condition: Secondary | ICD-10-CM

## 2022-08-26 DIAGNOSIS — I10 Essential (primary) hypertension: Secondary | ICD-10-CM

## 2022-08-26 DIAGNOSIS — Z720 Tobacco use: Secondary | ICD-10-CM

## 2022-08-26 MED ORDER — NALTREXONE HCL 50 MG PO TABS
50.0000 mg | ORAL_TABLET | Freq: Every day | ORAL | 1 refills | Status: AC
Start: 2022-08-26 — End: ?
  Filled 2022-08-26: qty 30, 30d supply, fill #0
  Filled 2022-10-01 – 2022-10-21 (×2): qty 30, 30d supply, fill #1

## 2022-08-26 MED ORDER — TRAZODONE HCL 50 MG PO TABS
50.0000 mg | ORAL_TABLET | Freq: Every evening | ORAL | 0 refills | Status: DC | PRN
Start: 2022-08-26 — End: 2022-10-01
  Filled 2022-08-26: qty 30, 30d supply, fill #0

## 2022-08-26 MED ORDER — NICOTINE 14 MG/24HR TD PT24
14.0000 mg | MEDICATED_PATCH | TRANSDERMAL | 1 refills | Status: AC
Start: 2022-08-26 — End: ?
  Filled 2022-08-26: qty 28, 28d supply, fill #0
  Filled 2022-10-01 – 2022-10-21 (×2): qty 28, 28d supply, fill #1

## 2022-08-27 ENCOUNTER — Other Ambulatory Visit (HOSPITAL_COMMUNITY): Payer: Self-pay

## 2022-10-01 ENCOUNTER — Other Ambulatory Visit: Payer: Self-pay

## 2022-10-01 ENCOUNTER — Other Ambulatory Visit (HOSPITAL_COMMUNITY): Payer: Self-pay

## 2022-10-01 ENCOUNTER — Other Ambulatory Visit: Payer: Self-pay | Admitting: Student

## 2022-10-01 DIAGNOSIS — G4701 Insomnia due to medical condition: Secondary | ICD-10-CM

## 2022-10-01 MED FILL — Trazodone HCl Tab 50 MG: ORAL | 30 days supply | Qty: 30 | Fill #0 | Status: CN

## 2022-10-10 ENCOUNTER — Other Ambulatory Visit (HOSPITAL_COMMUNITY): Payer: Self-pay

## 2022-10-15 ENCOUNTER — Other Ambulatory Visit (HOSPITAL_COMMUNITY): Payer: Self-pay

## 2022-10-17 ENCOUNTER — Other Ambulatory Visit (HOSPITAL_COMMUNITY): Payer: Self-pay

## 2022-10-21 ENCOUNTER — Other Ambulatory Visit (HOSPITAL_COMMUNITY): Payer: Self-pay

## 2022-10-21 MED FILL — Trazodone HCl Tab 50 MG: ORAL | 30 days supply | Qty: 30 | Fill #0 | Status: AC

## 2022-11-27 ENCOUNTER — Other Ambulatory Visit (HOSPITAL_COMMUNITY): Payer: Self-pay

## 2023-04-27 ENCOUNTER — Other Ambulatory Visit: Payer: Self-pay

## 2023-04-27 ENCOUNTER — Encounter (HOSPITAL_COMMUNITY): Payer: Self-pay | Admitting: Emergency Medicine

## 2023-04-27 ENCOUNTER — Emergency Department (HOSPITAL_COMMUNITY)
Admission: EM | Admit: 2023-04-27 | Discharge: 2023-04-27 | Disposition: A | Discharge status: Home / Self Care | Payer: Self-pay | Attending: Emergency Medicine | Admitting: Emergency Medicine

## 2023-04-27 DIAGNOSIS — H16012 Central corneal ulcer, left eye: Secondary | ICD-10-CM | POA: Insufficient documentation

## 2023-04-27 MED ORDER — FLUORESCEIN SODIUM 1 MG OP STRP
1.0000 | ORAL_STRIP | Freq: Once | OPHTHALMIC | Status: AC
  Administered 2023-04-27: 1 via OPHTHALMIC
  Filled 2023-04-27: qty 1

## 2023-04-27 MED ORDER — TETRACAINE HCL 0.5 % OP SOLN
1.0000 [drp] | Freq: Once | OPHTHALMIC | Status: AC
  Administered 2023-04-27: 1 [drp] via OPHTHALMIC
  Filled 2023-04-27: qty 4

## 2023-04-27 MED ORDER — ERYTHROMYCIN 5 MG/GM OP OINT
TOPICAL_OINTMENT | Freq: Four times a day (QID) | OPHTHALMIC | Status: DC
  Filled 2023-04-27: qty 3.5

## 2023-04-27 MED ORDER — POLYMYXIN B-TRIMETHOPRIM 10000-0.1 UNIT/ML-% OP SOLN
1.0000 [drp] | Freq: Once | OPHTHALMIC | Status: AC
  Administered 2023-04-27: 1 [drp] via OPHTHALMIC
  Filled 2023-04-27: qty 10

## 2023-04-27 MED ORDER — OFLOXACIN 0.3 % OP SOLN
1.0000 [drp] | Freq: Once | OPHTHALMIC | Status: AC
  Administered 2023-04-27: 1 [drp] via OPHTHALMIC
  Filled 2023-04-27: qty 5

## 2023-04-27 NOTE — ED Provider Notes (Signed)
 [place] EMERGENCY DEPARTMENT AT [facility] Provider Note   CSN: [id number] Arrival date & time: 04/27/23  1030     History {Add pertinent medical, surgical, social history, OB history to HPI:1} Chief Complaint  Patient presents with   Facial Swelling    Kayla Sandoval is a 53 y.o. female.  pt complains of facial swelling and vision problems in her left eye.  She said she had a cold for 2 weeks and she had come congestion and her eye but also she thinks she may have left a contact in her eye.   Eye Problem      Home Medications Prior to Admission medications   Medication Sig Start Date End Date Taking? Authorizing Provider  amLODipine (NORVASC) 5 MG tablet Take 1 tablet (5 mg total) by mouth daily. 12/13/21 12/13/22  Willette Cluster, MD  citalopram (CELEXA) 20 MG tablet Take 1&1/2 tablets (30 mg total) by mouth daily. 12/13/21   Willette Cluster, MD  diclofenac Sodium (VOLTAREN) 1 % GEL Apply 2 g topically to hands 4 (four) times daily as needed. 08/30/21   Willette Cluster, MD  furosemide (LASIX) 40 MG tablet Take 1 tablet (40 mg total) by mouth 2 (two) times daily. 12/03/21   Willette Cluster, MD  lactulose (CHRONULAC) 10 GM/15ML solution Take 30 mLs (20 g total) by mouth 2 (two) times daily. 07/22/22   Willette Cluster, MD  Melatonin 10 MG TABS Take 10 mg by mouth at bedtime as needed (sleep). 03/20/20   Roylene Reason, MD  naltrexone (DEPADE) 50 MG tablet Take 1 tablet (50 mg total) by mouth daily. 08/26/22   Kathleen Lime, MD  nicotine (NICODERM CQ - DOSED IN MG/24 HOURS) 14 mg/24hr patch Place 1 patch (14 mg total) onto the skin daily. 08/26/22   Kathleen Lime, MD  pantoprazole (PROTONIX) 40 MG tablet Take 1 tablet (40 mg total) by mouth daily. 12/07/21   Masters, Florentina Addison, DO  spironolactone (ALDACTONE) 100 MG tablet Take 1 tablet (100 mg total) by mouth 2 (two) times daily. 12/03/21   Willette Cluster, MD  traZODone (DESYREL) 50 MG tablet Take 1 tablet (50 mg total) by mouth at  bedtime as needed for sleep. 10/01/22   Kathleen Lime, MD      Allergies    Molds & smuts    Review of Systems   Review of Systems  Physical Exam Updated Vital Signs BP (!) 131/91   Pulse 96   Temp 98.7 F (37.1 C) (Oral)   Resp 18   Ht 5\' 4"  (1.626 m)   Wt 60 kg   SpO2 94%   BMI 22.71 kg/m  Physical Exam Vitals and nursing note reviewed.  Constitutional:      Appearance: She is well-developed.  HENT:     Head: Normocephalic.     Nose: Nose normal.  Eyes:     General: No scleral icterus.    Comments: Conjunctiva injected significantly, pupils pinpoint nonreactive.  Patient has a haziness over her cornea.  She states she can only see haziness on vision.  Fluorescein shows significant uptake from abrasion to the center of the eye.  Ocular pressures are 25  Neck:     Thyroid: No thyromegaly.  Cardiovascular:     Rate and Rhythm: Normal rate and regular rhythm.     Heart sounds: No murmur heard.    No friction rub. No gallop.  Pulmonary:     Breath sounds: No stridor. No wheezing or rales.  Chest:  Chest wall: No tenderness.  Abdominal:     General: There is no distension.     Tenderness: There is no abdominal tenderness. There is no rebound.  Musculoskeletal:        General: Normal range of motion.     Cervical back: Neck supple.  Lymphadenopathy:     Cervical: No cervical adenopathy.  Skin:    Findings: No erythema or rash.  Neurological:     Mental Status: She is alert and oriented to person, place, and time.     Motor: No abnormal muscle tone.     Coordination: Coordination normal.  Psychiatric:        Behavior: Behavior normal.     ED Results / Procedures / Treatments   Labs (all labs ordered are listed, but only abnormal results are displayed) Labs Reviewed - No data to display  EKG None  Radiology No results found.  Procedures Procedures  {Document cardiac monitor, telemetry assessment procedure when appropriate:1}  Medications Ordered  in ED Medications  tetracaine (PONTOCAINE) 0.5 % ophthalmic solution 1 drop (has no administration in time range)  fluorescein ophthalmic strip 1 strip (has no administration in time range)    ED Course/ Medical Decision Making/ A&P   {I spoke with Dr. Vanessa Barbara ophthalmology and showed him a picture of the patient's eye.  He recommended polymyxin and ofloxacin drops every hour to that left eye along with erythromycin ointment 4 times a day.  The patient will see Dr. Vanessa Barbara tomorrow at 8 AM Click here for ABCD2, HEART and other calculatorsREFRESH Note before signing :1}                              Medical Decision Making Risk Prescription drug management.   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  Patient is starting antibiotics and will see the ophthalmologist tomorrow  {Document critical care time when appropriate:1} {Document review of labs and clinical decision tools ie heart score, Chads2Vasc2 etc:1}  {Document your independent review of radiology images, and any outside records:1} {Document your discussion with family members, caretakers, and with consultants:1} {Document social determinants of health affecting pt's care:1} {Document your decision making why or why not admission, treatments were needed:1} Final Clinical Impression(s) / ED Diagnoses Final diagnoses:  None    Rx / DC Orders ED Discharge Orders     None

## 2023-04-27 NOTE — Discharge Instructions (Signed)
 Use the eyedrops we gave you every hour.  Put 1 drop in your left eye of both eyedrops every hour.  The ointment we will give you you should use every 6 hours.  You need to go to the eye doctor tomorrow at 8 AM.  His address is [address], [address], [place], [place] [address].  Office phone number is [phone]

## 2023-04-27 NOTE — ED Triage Notes (Signed)
 BIB by EMS for swelling of the face x3 weeks. Pt presents with significant edema to the left side of the face. Left eye appears red, swollen, and shows signs of infection. Pt reports blurry vision and significant pain/pressure. Nausea and vomiting reported, but has improved recently. Chest pain and shortness of breath have been present, but not upon arrival.

## 2023-04-28 ENCOUNTER — Encounter (INDEPENDENT_AMBULATORY_CARE_PROVIDER_SITE_OTHER): Payer: Self-pay | Admitting: Ophthalmology

## 2023-05-13 DEATH — deceased
